# Patient Record
Sex: Female | Born: 1940 | Race: White | Hispanic: No | Marital: Married | Smoking: Former smoker
Health system: Southern US, Community
[De-identification: ages and names within clinical notes are randomized; demographics above are authoritative.]

## PROBLEM LIST (undated history)

## (undated) DIAGNOSIS — I499 Cardiac arrhythmia, unspecified: Secondary | ICD-10-CM

## (undated) DIAGNOSIS — IMO0001 Reserved for inherently not codable concepts without codable children: Secondary | ICD-10-CM

## (undated) DIAGNOSIS — J439 Emphysema, unspecified: Secondary | ICD-10-CM

## (undated) DIAGNOSIS — E559 Vitamin D deficiency, unspecified: Secondary | ICD-10-CM

## (undated) DIAGNOSIS — Z5189 Encounter for other specified aftercare: Secondary | ICD-10-CM

## (undated) DIAGNOSIS — B3781 Candidal esophagitis: Secondary | ICD-10-CM

## (undated) DIAGNOSIS — N319 Neuromuscular dysfunction of bladder, unspecified: Secondary | ICD-10-CM

## (undated) DIAGNOSIS — J189 Pneumonia, unspecified organism: Secondary | ICD-10-CM

## (undated) DIAGNOSIS — M75120 Complete rotator cuff tear or rupture of unspecified shoulder, not specified as traumatic: Secondary | ICD-10-CM

## (undated) DIAGNOSIS — J449 Chronic obstructive pulmonary disease, unspecified: Secondary | ICD-10-CM

## (undated) DIAGNOSIS — K222 Esophageal obstruction: Secondary | ICD-10-CM

## (undated) DIAGNOSIS — K219 Gastro-esophageal reflux disease without esophagitis: Secondary | ICD-10-CM

## (undated) DIAGNOSIS — C801 Malignant (primary) neoplasm, unspecified: Secondary | ICD-10-CM

## (undated) DIAGNOSIS — Z86010 Personal history of colonic polyps: Secondary | ICD-10-CM

## (undated) DIAGNOSIS — I10 Essential (primary) hypertension: Secondary | ICD-10-CM

## (undated) DIAGNOSIS — H269 Unspecified cataract: Secondary | ICD-10-CM

## (undated) DIAGNOSIS — D518 Other vitamin B12 deficiency anemias: Secondary | ICD-10-CM

## (undated) DIAGNOSIS — S42209A Unspecified fracture of upper end of unspecified humerus, initial encounter for closed fracture: Secondary | ICD-10-CM

## (undated) DIAGNOSIS — E1165 Type 2 diabetes mellitus with hyperglycemia: Secondary | ICD-10-CM

## (undated) DIAGNOSIS — E785 Hyperlipidemia, unspecified: Secondary | ICD-10-CM

## (undated) HISTORY — DX: Unspecified fracture of upper end of unspecified humerus, initial encounter for closed fracture: S42.209A

## (undated) HISTORY — DX: Reserved for inherently not codable concepts without codable children: IMO0001

## (undated) HISTORY — PX: COLON SURGERY: SHX602

## (undated) HISTORY — DX: Type 2 diabetes mellitus with hyperglycemia: E11.65

## (undated) HISTORY — PX: CATARACT EXTRACTION, BILATERAL: SHX1313

## (undated) HISTORY — DX: Gastro-esophageal reflux disease without esophagitis: K21.9

## (undated) HISTORY — DX: Hyperlipidemia, unspecified: E78.5

## (undated) HISTORY — DX: Neuromuscular dysfunction of bladder, unspecified: N31.9

## (undated) HISTORY — DX: Chronic obstructive pulmonary disease, unspecified: J44.9

## (undated) HISTORY — DX: Emphysema, unspecified: J43.9

## (undated) HISTORY — PX: DILATION AND CURETTAGE OF UTERUS: SHX78

## (undated) HISTORY — DX: Essential (primary) hypertension: I10

## (undated) HISTORY — DX: Complete rotator cuff tear or rupture of unspecified shoulder, not specified as traumatic: M75.120

## (undated) HISTORY — DX: Vitamin D deficiency, unspecified: E55.9

## (undated) HISTORY — DX: Pneumonia, unspecified organism: J18.9

## (undated) HISTORY — PX: TONSILLECTOMY AND ADENOIDECTOMY: SUR1326

## (undated) HISTORY — DX: Candidal esophagitis: B37.81

## (undated) HISTORY — DX: Encounter for other specified aftercare: Z51.89

## (undated) HISTORY — DX: Other vitamin B12 deficiency anemias: D51.8

## (undated) HISTORY — DX: Unspecified cataract: H26.9

## (undated) HISTORY — DX: Personal history of colonic polyps: Z86.010

---

## 2008-08-03 HISTORY — PX: EYE SURGERY: SHX253

## 2009-08-03 HISTORY — PX: ORIF SHOULDER FRACTURE: SHX5035

## 2010-04-25 ENCOUNTER — Encounter
Admission: RE | Admit: 2010-04-25 | Discharge: 2010-04-25 | Payer: Self-pay | Source: Home / Self Care | Admitting: Family Medicine

## 2010-07-15 ENCOUNTER — Inpatient Hospital Stay (HOSPITAL_COMMUNITY)
Admission: EM | Admit: 2010-07-15 | Discharge: 2010-07-18 | Payer: Self-pay | Source: Home / Self Care | Attending: Orthopedic Surgery | Admitting: Orthopedic Surgery

## 2010-08-22 NOTE — Discharge Summary (Signed)
Amanda, [name]                ACCOUNT NO.:  [id number]  MEDICAL RECORD NO.:  [id number]          PATIENT TYPE:  INP  LOCATION:  5035                         FACILITY:  [facility]  PHYSICIAN:  Harvie Junior, M.D.   DATE OF BIRTH:  July 29, 1941  DATE OF ADMISSION:  07/15/2010 DATE OF DISCHARGE:  07/18/2010                              DISCHARGE SUMMARY   ADMITTING DIAGNOSES: 1. Four-part right proximal humerus fracture with dislocation of right     proximal humerus. 2. History of chronic obstructive pulmonary disease. 3. History of asthma. 4. History of diet-controlled diabetes mellitus. 5. Radial nerve palsy, right upper extremity status post right     shoulder injury 6. Acute blood loss anemia.  DISCHARGE DIAGNOSES: 1. Four-part right proximal humerus fracture with dislocation of right     proximal humerus. 2. History of chronic obstructive pulmonary disease. 3. History of asthma. 4. History of diet-controlled diabetes mellitus. 5. Type 2 diabetes. 6. Leukocytosis. 7. Radial nerve palsy, right upper extremity status post right     shoulder injury 8. Acute blood loss anemia.  PROCEDURES IN HOSPITAL: 1. Hemiarthroplasty, right proximal humerus fracture for treatment of     four-part fracture dislocation of the proximal humerus. 2. Biceps tenodesis. 3. Rotator cuff repair, all done by Jodi Geralds, MD, on July 15, 2010.  CONSULTATIONS IN HOSPITAL:  [facility], Lonia Blood, MD, July 16, 2010.  BRIEF HISTORY:  [name] is a pleasant 70 year old female who fell onto her shoulder on the day of admission.  The patient had no other injuries.  She had a significant injury to her right shoulder.  She was transported to [facility] where x-rays of the right shoulder showed a four-part fracture dislocation of her right proximal humerus. She also had a CT scan of the right proximal humerus which showed a comminuted displaced fracture involving  the right humeral head and neck. Majority of the right humeral head was dislocated inferiorly and medially, lodged below the glenoid.  There were multiple greater tuberosity fragments seen and the more distal humerus is proximally displaced.  There was poor characterization of the rotator cuff and the long head of the biceps tendon as well.  She had some weakness of her right hand dorsiflexors i.e. the radial nerve.  Based upon her clinical and radiographic findings, she is felt to be a candidate for a right shoulder hemiarthroplasty and possible rotator cuff repair and biceps tenodesis depending upon the findings at the time of surgery.  She was admitted by Dr. Luiz Blare for this.  PERTINENT LABORATORY STUDIES:  X-rays of the chest on admission showed no acute cardiopulmonary process, obstructive lung disease was noted as before.  X-ray of the right shoulder showed a significantly displaced fracture dislocation of the proximal right humerus as described.  CT scan of the right shoulder showed a comminuted displaced fracture involving the right humeral head and neck.  The majority of the right humeral head was dislocated inferiorly and medially and lodged below the glenoid.  multiple greater tuberosity fragments were noted and the more distal humerus is proximally displaced.  It was associated with soft tissue disruption with a moderate-to-large lipohemarthrosis with poor characterization of the rotator cuff and long head of the biceps tendon. EKG on admission showed normal sinus rhythm with no significant changes since last tracing.  Hemoglobin on admission was 12.3, hematocrit 36.9, WBC 16.3.  On postop day #1, hemoglobin was 8.6 with a hematocrit of 26.5.  Postop day #2, hemoglobin was 8.6, hematocrit 26.5.  Platelet counts were within normal range.  Indices were grossly within normal limits preoperatively.  Protime on admission was 12.4 seconds and INR of 0.90.  The patient's BMET was  within normal range other than elevated glucose of 263, followed again and on admission was noted to be 271.  On postop day #1, the BMET was within normal range other than elevated glucose of 151.  On July 18, 2010, BMET was normal.  Her EGFR was greater than 60 through the hospitalization.  Her hemoglobin A1c was 6.6.  Lipid profile showed total cholesterol of 120, triglycerides of 67, HDL 46, LDL 61.  Urinalysis on admission showed hyaline casts with greater than 1000 of glucose.  CBGs followed during the hospitalization ranged from 150 up to 195.  HOSPITAL COURSE:  The patient underwent right shoulder surgery as well as described in Dr. Luiz Blare' operative note.  Preoperatively, she did have weakness over right wrist dorsiflexors, felt to be radial nerve stretch.  She initially had this postoperative as well.  She underwent surgery as well described in Dr. Luiz Blare' operative note.  On the day of admission, she was given preoperative IV antibiotics and postop Ancef 1 g IV q.8 h.  Sling was ordered and IV fluids were instituted along with PCA morphine for pain control.  The patient has had a medical consult by Dr. Sharon Seller from the [facility] to manage her COPD, hyperglycemia and glucosuria.  His help was greatly appreciated and followed the patient during the hospitalization.  On postop day #1, the patient had moderate right shoulder pain.  She was taking fluids.  Foley was placed at the time of surgery in place.  Her temperature was 100.1 and then found to be 99.9.  Her vital signs were stable.  Her hemoglobin was 8.6. She was started on oral iron 325 mg b.i.d.  OT saw her for pendulum exercises.  Her Foley catheter was discontinued.  She did have good ability to fire her deltoid but she had radial nerve weakness in the right upper extremity.  On postoperative day #2, the patient was without complaints.  She was taking fluids and voiding without difficulty.  She was out of bed  to the bathroom with assistance.  She had a low grade fever of 99.  Vital signs were stable.  She was with tachycardic at 114. Her hemoglobin was 8.6.  Her O2 sats were 90% on room air.  Her BMET was normal.  Her right shoulder dressing was clean and dry.  She was placed in a wrist splint for her radial nerve palsy.  Her IV was discontinued. She was discharged home in improved condition.  She will be on a diabetic diet, was given Rx Percocet 5 mg p.r.n. for pain.  She will need a home health physical therapy and OT and wear a wrist band on the right upper extremity.  She will follow up with Dr. Luiz Blare in the office in 2 weeks.  Her plan will be to discharge home and follow up with her personal care physician in approximately 7-10 days.  Her  medications at discharge will include albuterol inhaler 2 puffs q.4 h. p.r.n. and albuterol nebulized 4 times daily as needed. Percocet 5 mg 1-2 q.6 h. p.r.n. pain.  Amaryl as directed per [facility], lisinopril 1 daily as directed and Levaquin as directed. She will follow up with her PCP in 7-10 days.  She will call our office should she have any difficulties.     Marshia Ly, P.A.   ______________________________ Harvie Junior, M.D.    JB/MEDQ  D:  08/13/2010  T:  08/14/2010  Job:  [id number]  Electronically Signed by Marshia Ly P.A. on 08/20/2010 04:50:41 PM Electronically Signed by Jodi Geralds M.D. on 08/21/2010 09:00:01 PM

## 2010-10-13 LAB — GLUCOSE, CAPILLARY
Glucose-Capillary: 115 mg/dL — ABNORMAL HIGH (ref 70–99)
Glucose-Capillary: 138 mg/dL — ABNORMAL HIGH (ref 70–99)
Glucose-Capillary: 165 mg/dL — ABNORMAL HIGH (ref 70–99)
Glucose-Capillary: 195 mg/dL — ABNORMAL HIGH (ref 70–99)
Glucose-Capillary: 89 mg/dL (ref 70–99)

## 2010-10-13 LAB — LIPID PANEL
Cholesterol: 120 mg/dL (ref 0–200)
LDL Cholesterol: 61 mg/dL (ref 0–99)
Total CHOL/HDL Ratio: 2.6 RATIO
Triglycerides: 67 mg/dL (ref <150)

## 2010-10-13 LAB — BASIC METABOLIC PANEL
BUN: 12 mg/dL (ref 6–23)
CO2: 27 mEq/L (ref 19–32)
Calcium: 8.4 mg/dL (ref 8.4–10.5)
Calcium: 8.6 mg/dL (ref 8.4–10.5)
Chloride: 102 mEq/L (ref 96–112)
Creatinine, Ser: 0.95 mg/dL (ref 0.4–1.2)
GFR calc Af Amer: >60 mL/min (ref >60)
GFR calc non Af Amer: 58 mL/min — ABNORMAL LOW (ref >60)
Potassium: 4.4 mEq/L (ref 3.5–5.1)
Sodium: 134 mEq/L — ABNORMAL LOW (ref 135–145)

## 2010-10-13 LAB — CBC
HCT: 26.5 % — ABNORMAL LOW (ref 36.0–46.0)
Hemoglobin: 8.6 g/dL — ABNORMAL LOW (ref 12.0–15.0)
Hemoglobin: 8.6 g/dL — ABNORMAL LOW (ref 12.0–15.0)
MCV: 90.1 fL (ref 78.0–100.0)
Platelets: 232 10*3/uL (ref 150–400)
RBC: 2.94 MIL/uL — ABNORMAL LOW (ref 3.87–5.11)
RDW: 13 % (ref 11.5–15.5)
RDW: 13 % (ref 11.5–15.5)
WBC: 12.6 10*3/uL — ABNORMAL HIGH (ref 4.0–10.5)

## 2010-10-13 LAB — HEMOGLOBIN A1C: Hgb A1c MFr Bld: 6.6 % — ABNORMAL HIGH (ref <5.7)

## 2010-10-14 LAB — CBC
Hemoglobin: 12.3 g/dL (ref 12.0–15.0)
MCH: 29.6 pg (ref 26.0–34.0)
MCHC: 33.3 g/dL (ref 30.0–36.0)
MCV: 88.7 fL (ref 78.0–100.0)
RBC: 4.16 MIL/uL (ref 3.87–5.11)
RDW: 12.7 % (ref 11.5–15.5)

## 2010-10-14 LAB — COMPREHENSIVE METABOLIC PANEL
ALT: 11 U/L (ref 0–35)
Albumin: 3.6 g/dL (ref 3.5–5.2)
CO2: 25 mEq/L (ref 19–32)
Calcium: 8.8 mg/dL (ref 8.4–10.5)
Chloride: 104 mEq/L (ref 96–112)
Creatinine, Ser: 0.87 mg/dL (ref 0.4–1.2)
Glucose, Bld: 263 mg/dL — ABNORMAL HIGH (ref 70–99)
Total Bilirubin: 0.4 mg/dL (ref 0.3–1.2)
Total Protein: 6.5 g/dL (ref 6.0–8.3)

## 2010-10-14 LAB — BASIC METABOLIC PANEL
CO2: 25 mEq/L (ref 19–32)
Calcium: 8.6 mg/dL (ref 8.4–10.5)
Creatinine, Ser: 0.82 mg/dL (ref 0.4–1.2)
GFR calc Af Amer: >60 mL/min (ref >60)
GFR calc non Af Amer: >60 mL/min (ref >60)
Glucose, Bld: 271 mg/dL — ABNORMAL HIGH (ref 70–99)
Sodium: 132 mEq/L — ABNORMAL LOW (ref 135–145)

## 2010-10-14 LAB — DIFFERENTIAL
Basophils Absolute: 0 10*3/uL (ref 0.0–0.1)
Eosinophils Absolute: 0.1 10*3/uL (ref 0.0–0.7)
Eosinophils Relative: 1 % (ref 0–5)
Monocytes Absolute: 0.9 10*3/uL (ref 0.1–1.0)
Neutro Abs: 14.3 10*3/uL — ABNORMAL HIGH (ref 1.7–7.7)

## 2010-10-14 LAB — URINE MICROSCOPIC-ADD ON

## 2010-10-14 LAB — URINALYSIS, ROUTINE W REFLEX MICROSCOPIC
Bilirubin Urine: NEGATIVE
Protein, ur: NEGATIVE mg/dL
Urobilinogen, UA: 0.2 mg/dL (ref 0.0–1.0)

## 2010-10-14 LAB — PROTIME-INR: INR: 0.9 (ref 0.00–1.49)

## 2010-10-14 LAB — GLUCOSE, CAPILLARY: Glucose-Capillary: 197 mg/dL — ABNORMAL HIGH (ref 70–99)

## 2013-11-08 ENCOUNTER — Encounter: Payer: Self-pay | Admitting: Internal Medicine

## 2013-12-19 ENCOUNTER — Other Ambulatory Visit: Payer: Self-pay | Admitting: Family Medicine

## 2013-12-19 ENCOUNTER — Ambulatory Visit
Admission: RE | Admit: 2013-12-19 | Discharge: 2013-12-19 | Disposition: A | Discharge status: Home / Self Care | Payer: Medicare HMO | Source: Ambulatory Visit | Attending: Family Medicine | Admitting: Family Medicine

## 2013-12-19 DIAGNOSIS — J189 Pneumonia, unspecified organism: Secondary | ICD-10-CM

## 2013-12-28 ENCOUNTER — Ambulatory Visit (INDEPENDENT_AMBULATORY_CARE_PROVIDER_SITE_OTHER): Payer: Medicare HMO | Admitting: Pulmonary Disease

## 2013-12-28 ENCOUNTER — Encounter: Payer: Self-pay | Admitting: Internal Medicine

## 2013-12-28 ENCOUNTER — Encounter: Payer: Self-pay | Admitting: Pulmonary Disease

## 2013-12-28 ENCOUNTER — Ambulatory Visit (INDEPENDENT_AMBULATORY_CARE_PROVIDER_SITE_OTHER): Payer: Medicare HMO | Admitting: Internal Medicine

## 2013-12-28 ENCOUNTER — Ambulatory Visit (INDEPENDENT_AMBULATORY_CARE_PROVIDER_SITE_OTHER)
Admission: RE | Admit: 2013-12-28 | Discharge: 2013-12-28 | Disposition: A | Discharge status: Home / Self Care | Payer: Medicare HMO | Source: Ambulatory Visit | Attending: Pulmonary Disease | Admitting: Pulmonary Disease

## 2013-12-28 VITALS — BP 142/76 | HR 95 | Ht 61.0 in | Wt 106.0 lb

## 2013-12-28 VITALS — BP 140/72 | HR 92 | Ht 61.5 in | Wt 105.1 lb

## 2013-12-28 DIAGNOSIS — R1310 Dysphagia, unspecified: Secondary | ICD-10-CM

## 2013-12-28 DIAGNOSIS — R0609 Other forms of dyspnea: Secondary | ICD-10-CM

## 2013-12-28 DIAGNOSIS — R0989 Other specified symptoms and signs involving the circulatory and respiratory systems: Secondary | ICD-10-CM

## 2013-12-28 DIAGNOSIS — J441 Chronic obstructive pulmonary disease with (acute) exacerbation: Secondary | ICD-10-CM

## 2013-12-28 DIAGNOSIS — K222 Esophageal obstruction: Secondary | ICD-10-CM

## 2013-12-28 DIAGNOSIS — R1314 Dysphagia, pharyngoesophageal phase: Secondary | ICD-10-CM

## 2013-12-28 HISTORY — DX: Esophageal obstruction: K22.2

## 2013-12-28 MED ORDER — LEVOFLOXACIN 500 MG PO TABS: 500.0000 mg | ORAL_TABLET | Freq: Every day | ORAL | Status: DC

## 2013-12-28 MED ORDER — OMEPRAZOLE 40 MG PO CPDR
40.0000 mg | DELAYED_RELEASE_CAPSULE | Freq: Every day | ORAL | Status: DC
Start: 2013-12-28 — End: 2014-01-04

## 2013-12-28 MED ORDER — AEROCHAMBER MV MISC: Status: DC

## 2013-12-28 MED ORDER — PREDNISONE 10 MG PO TABS: ORAL_TABLET | ORAL | Status: DC

## 2013-12-28 NOTE — Patient Instructions (Addendum)
You have been scheduled for an endoscopy with propofol. Please follow written instructions given to you at your visit today. If you use inhalers (even only as needed), please bring them with you on the day of your procedure.  We have made you an appointment today with Dr. Simonne Maffucci at 11:45am on our second floor.  .I appreciate the opportunity to care for you.

## 2013-12-28 NOTE — Assessment & Plan Note (Addendum)
She is wheezing and not moving much air on exam today and considering her recent chest x-ray which shows emphysema I think the most likely etiology of her current episode of dyspnea and chest tightness is a COPD exacerbation.  However, she has not responded to therapy with prednisone and antibiotics earlier in the month. This either means that she has not received enough prednisone or that something else is going on. Right now all signs point to COPD.  We do not have pulmonary function tests on record to note the severity of her COPD.  Plan: - Chest x-ray today -EKG today -Long prednisone taper -Levaquin -Start Dulera twice a day with a spacer -If no improvement in 2-3 days or if worsening in the hospital for IV Solu-Medrol and further management -Followup with me in 4 weeks

## 2013-12-28 NOTE — Progress Notes (Signed)
Subjective:    Patient ID: Amanda [name], female    DOB: 01-31-1941, 73 y.o.   MRN: [id number]  HPI  This is a very pleasant 73 year old female who previously smoked one pack of cigarettes daily for approximately 40 years and quit smoking in 2007 who comes to our clinic today for evaluation of shortness of breath. She believes that she has COPD but she has never had lung function testing. She never had a prior diagnosis upper respiratory illness.  She states that ever since the beginning of May she has been having problems with chest congestion and cough. This initially started without shortness of breath and so she was seen by her primary care physician who apparently were prescribed an antibiotic at the beginning of May. Shortness of breath and chest tightness develops not long after this and so she went back to her primary care physician on May 17. A chest x-ray at that time showed emphysema and no evidence of an infiltrate. She was prescribed amoxicillin and prednisone at that point. Since then she says that she has not had benefit and she continues to have dyspnea on exertion. She does not have dyspnea at rest. She has not had chest pain, fevers, or chills. She feels chest congestion and has a cough but is been unable to produce mucus. She has been using albuterol on a regular basis at home that provides a little but of relief. She does not take any inhaled therapies other than this. She has not had leg swelling lately.  She also notes that she has been experiencing dysphagia and apparently is supposed to have an endoscopy with esophageal dilation at some point in the next few weeks.  Past Medical History  Diagnosis Date  . Emphysema lung   . Hyperlipidemia   . GERD (gastroesophageal reflux disease)   . Vitamin D deficiency   . Closed fracture of unspecified part of upper end of humerus   . Complete rupture of rotator cuff   . Type II or unspecified type diabetes mellitus without mention of  complication, uncontrolled   . Other vitamin B12 deficiency anemia   . Neurogenic bladder, NOS   . Essential hypertension, benign   . COPD (chronic obstructive pulmonary disease)      Family History  Problem Relation Age of Onset  . Diabetes Father   . Bone cancer Father     bone marrow  . Emphysema Mother   . Pneumonia Mother      History   Social History  . Marital Status: Divorced    Spouse Name: N/A    Number of Children: 81  . Years of Education: N/A   Occupational History  . retired    Social History Main Topics  . Smoking status: Former Smoker -- 1.00 packs/day for 20 years    Types: Cigarettes    Quit date: 08/03/2005  . Smokeless tobacco: Never Used  . Alcohol Use: No  . Drug Use: No  . Sexual Activity: Not on file   Other Topics Concern  . Not on file   Social History Narrative  . No narrative on file     No Known Allergies   Outpatient Prescriptions Prior to Visit  Medication Sig Dispense Refill  . albuterol (PROAIR HFA) 108 (90 BASE) MCG/ACT inhaler Inhale 2 puffs into the lungs 4 (four) times daily.      Marland Kitchen albuterol (PROVENTIL) (2.5 MG/3ML) 0.083% nebulizer solution Take 2.5 mg by nebulization 4 (four) times daily.      Marland Kitchen  omeprazole (PRILOSEC) 40 MG capsule Take 1 capsule (40 mg total) by mouth daily before breakfast. Place in applesauce or open capsule and sprinkle on applesauce  30 capsule  11   No facility-administered medications prior to visit.      Review of Systems  Constitutional: Negative for fever and unexpected weight change.  HENT: Negative for congestion, dental problem, ear pain, nosebleeds, postnasal drip, rhinorrhea, sinus pressure, sneezing, sore throat and trouble swallowing.   Eyes: Negative for redness and itching.  Respiratory: Positive for shortness of breath and wheezing. Negative for cough and chest tightness.   Cardiovascular: Negative for palpitations and leg swelling.  Gastrointestinal: Negative for nausea and  vomiting.  Genitourinary: Negative for dysuria.  Musculoskeletal: Negative for joint swelling.  Skin: Negative for rash.  Neurological: Negative for headaches.  Hematological: Does not bruise/bleed easily.  Psychiatric/Behavioral: Negative for dysphoric mood. The patient is not nervous/anxious.        Objective:   Physical Exam Filed Vitals:   12/28/13 1138  BP: 142/76  Pulse: 95  Height: 5\' 1"  (1.549 m)  Weight: 106 lb (48.081 kg)  SpO2: 94%   RA  Gen: dyspnic on exertion but comfortable at rest, no acute distress HEENT: NCAT, PERRL, EOMi, OP clear, neck supple without masses PULM: Poor air movement, wheezing bilaterally CV: RRR, no mgr, no JVD AB: BS+, soft, nontender, no hsm Ext: warm, no edema, no clubbing, no cyanosis Derm: no rash or skin breakdown Neuro: A&Ox4, CN II-XII intact, strength 5/5 in all 4 extremities  12/17/2013 CXR imaging reviewed> emphysema, no infiltrate, calcified aortic arch     Assessment & Plan:   COPD exacerbation She is wheezing and not moving much air on exam today and considering her recent chest x-ray which shows emphysema I think the most likely etiology of her current episode of dyspnea and chest tightness is a COPD exacerbation.  However, she has not responded to therapy with prednisone and antibiotics earlier in the month. This either means that she has not received enough prednisone or that something else is going on. Right now all signs point to COPD.  We do not have pulmonary function tests on record to note the severity of her COPD.  Plan: - Chest x-ray today -EKG today -Long prednisone taper -Levaquin -Start Dulera twice a day with a spacer -If no improvement in 2-3 days or if worsening in the hospital for IV Solu-Medrol and further management -Followup with me in 4 weeks  Dysphagia Endoscopy with Dr. Carlean Purl is planned. Once she is over the COPD exacerbation I expect she should be able to tolerate this procedure without  difficulty.     Updated Medication List Outpatient Encounter Prescriptions as of 12/28/2013  Medication Sig  . albuterol (PROAIR HFA) 108 (90 BASE) MCG/ACT inhaler Inhale 2 puffs into the lungs 4 (four) times daily.  Marland Kitchen albuterol (PROVENTIL) (2.5 MG/3ML) 0.083% nebulizer solution Take 2.5 mg by nebulization 4 (four) times daily.  Marland Kitchen omeprazole (PRILOSEC) 40 MG capsule Take 1 capsule (40 mg total) by mouth daily before breakfast. Place in applesauce or open capsule and sprinkle on applesauce  . levofloxacin (LEVAQUIN) 500 MG tablet Take 1 tablet (500 mg total) by mouth daily.  . predniSONE (DELTASONE) 10 MG tablet Take 60mg  daily for 3 days, then take 40mg  daily for three days, then take 20mg  daily for three days, then take 10mg  daily for three days

## 2013-12-28 NOTE — Assessment & Plan Note (Signed)
Endoscopy with Dr. Carlean Purl is planned. Once she is over the COPD exacerbation I expect she should be able to tolerate this procedure without difficulty.

## 2013-12-28 NOTE — Patient Instructions (Signed)
Take the prednisone and levaquin as written Use the Dulera twice a day  Use your albuterol nebulizer regularly three times a day and every 4 hours as needed for shortness of breath If you don't get better in 2-3 days or if you get worse then go to the hospital We will see you back in 4 weeks or sooner if needed

## 2013-12-28 NOTE — Progress Notes (Signed)
Quick Note:  lmtcb X1 ______ 

## 2013-12-28 NOTE — Progress Notes (Signed)
Subjective:    Patient ID: Amanda [name], female    DOB: [birth year], 73 y.o.   MRN: [id number]  HPI The patient is a very nice 73 year old woman here with her daughter, with complaints of a long history, about 15 years, and intermittent solid food dysphagia. She describes a suprasternal and midsternal sticking point she often has to regurgitate food bolus. Hamburgers cornbread and rice are particular problem foods. She said she is to have heartburn problems but does not now. It sounds like she was on a PPI at some point in the past. She has chronic difficulty swallowing pills which seem separate than the food dysphagia. She has not had any progressive lost weight but has had a little weight loss recently she has been diagnosed with bronchitis or pneumonia and she is complaining of a lot of dyspnea on exertion. He would like some help with her breathing. She is on albuterol nebulizer and inhaler recently took a Z-Pak and prednisone. There is a cough which is improving. No Known Allergies No outpatient prescriptions prior to visit.   No facility-administered medications prior to visit.   Past Medical History  Diagnosis Date  . Emphysema lung   . Hyperlipidemia   . GERD (gastroesophageal reflux disease)   . Vitamin D deficiency   . Closed fracture of unspecified part of upper end of humerus   . Complete rupture of rotator cuff   . Type II or unspecified type diabetes mellitus without mention of complication, uncontrolled   . Other vitamin B12 deficiency anemia   . Neurogenic bladder, NOS   . Essential hypertension, benign   . COPD (chronic obstructive pulmonary disease)    Past Surgical History  Procedure Laterality Date  . Orif shoulder fracture Right     arthroplasty  . Tonsillectomy and adenoidectomy     History   Social History  . Marital Status: Divorced    Spouse Name: N/A    Number of Children: 73  . Years of Education: N/A   Occupational History  . retired     Social History Main Topics  . Smoking status: Former Smoker -- 1.00 packs/day for 20 years    Types: Cigarettes    Quit date: 08/03/2005  . Smokeless tobacco: Never Used  . Alcohol Use: No  . Drug Use: No  . Sexual Activity: None   Other Topics Concern  . None   Social History Narrative   Divorced and retired Insurance claims handler. 2 sons 2 daughters. 2 caffeinated beverages daily.   Lives in [place].   Family History  Problem Relation Age of Onset  . Diabetes Father   . Bone cancer Father     bone marrow  . Emphysema Mother   . Pneumonia Mother     Review of Systems As per history of present illness. He feels like she is urinating frequently. All other review of systems negative.    Objective:   Physical Exam General:  Well-developed, well-nourished and in no acute distress Eyes:  anicteric. ENT:   Mouth and posterior pharynx free of lesions.  Neck:   supple w/o thyromegaly or mass.  Lungs: Diffusely decreased BS and scattered weezes, poor - fair air mvt, + kyphosis Heart:  Distant S1S2, no rubs, murmurs, gallops. Abdomen:  soft, non-tender, no hepatosplenomegaly, hernia, or mass and BS+.  Lymph:  no cervical or supraclavicular adenopathy. Extremities:   no edema Skin   no rash. Neuro:  A&O x 3.  Psych:  appropriate mood and  Affect.   Data Reviewed: Recent chest x-ray.       Assessment & Plan:   1. Dysphagia, pharyngoesophageal phase   2. Dyspnea on exertion    1. Will schedule upper GI endoscopy for #1. Risks benefits and indications are explained. Esophageal dilation is planned. 2. Start PPI. 3. She was seen by pulmonary today and treatment changes made regarding dyspnea and her COPD exacerbation.. As long as she improves with their expert care would be able to proceed with endoscopy as planned on June 10. Otherwise might need to delay that.   CC: Tamsen Roers, MD and Roselie Awkward, MD

## 2014-01-01 ENCOUNTER — Emergency Department (HOSPITAL_COMMUNITY): Payer: Medicare HMO

## 2014-01-01 ENCOUNTER — Emergency Department (HOSPITAL_COMMUNITY)
Admission: EM | Admit: 2014-01-01 | Discharge: 2014-01-01 | Disposition: A | Discharge status: Home / Self Care | Payer: Medicare HMO | Attending: Emergency Medicine | Admitting: Emergency Medicine

## 2014-01-01 ENCOUNTER — Other Ambulatory Visit: Payer: Self-pay

## 2014-01-01 ENCOUNTER — Encounter (HOSPITAL_COMMUNITY): Payer: Self-pay | Admitting: Emergency Medicine

## 2014-01-01 DIAGNOSIS — IMO0001 Reserved for inherently not codable concepts without codable children: Secondary | ICD-10-CM | POA: Insufficient documentation

## 2014-01-01 DIAGNOSIS — Z79899 Other long term (current) drug therapy: Secondary | ICD-10-CM | POA: Insufficient documentation

## 2014-01-01 DIAGNOSIS — J449 Chronic obstructive pulmonary disease, unspecified: Secondary | ICD-10-CM

## 2014-01-01 DIAGNOSIS — Z8781 Personal history of (healed) traumatic fracture: Secondary | ICD-10-CM | POA: Insufficient documentation

## 2014-01-01 DIAGNOSIS — D649 Anemia, unspecified: Secondary | ICD-10-CM | POA: Insufficient documentation

## 2014-01-01 DIAGNOSIS — I10 Essential (primary) hypertension: Secondary | ICD-10-CM | POA: Insufficient documentation

## 2014-01-01 DIAGNOSIS — Z87891 Personal history of nicotine dependence: Secondary | ICD-10-CM | POA: Insufficient documentation

## 2014-01-01 DIAGNOSIS — Z8739 Personal history of other diseases of the musculoskeletal system and connective tissue: Secondary | ICD-10-CM | POA: Insufficient documentation

## 2014-01-01 DIAGNOSIS — Z8639 Personal history of other endocrine, nutritional and metabolic disease: Secondary | ICD-10-CM | POA: Insufficient documentation

## 2014-01-01 DIAGNOSIS — Z862 Personal history of diseases of the blood and blood-forming organs and certain disorders involving the immune mechanism: Secondary | ICD-10-CM | POA: Insufficient documentation

## 2014-01-01 DIAGNOSIS — J438 Other emphysema: Secondary | ICD-10-CM | POA: Insufficient documentation

## 2014-01-01 DIAGNOSIS — Z792 Long term (current) use of antibiotics: Secondary | ICD-10-CM | POA: Insufficient documentation

## 2014-01-01 DIAGNOSIS — Z87448 Personal history of other diseases of urinary system: Secondary | ICD-10-CM | POA: Insufficient documentation

## 2014-01-01 DIAGNOSIS — IMO0002 Reserved for concepts with insufficient information to code with codable children: Secondary | ICD-10-CM | POA: Insufficient documentation

## 2014-01-01 DIAGNOSIS — E1165 Type 2 diabetes mellitus with hyperglycemia: Secondary | ICD-10-CM

## 2014-01-01 DIAGNOSIS — K219 Gastro-esophageal reflux disease without esophagitis: Secondary | ICD-10-CM | POA: Insufficient documentation

## 2014-01-01 LAB — CBC WITH DIFFERENTIAL/PLATELET
Basophils Absolute: 0 10*3/uL (ref 0.0–0.1)
Basophils Relative: 0 % (ref 0–1)
EOS PCT: 0 % (ref 0–5)
Eosinophils Absolute: 0 10*3/uL (ref 0.0–0.7)
HCT: 24.7 % — ABNORMAL LOW (ref 36.0–46.0)
Hemoglobin: 7.5 g/dL — ABNORMAL LOW (ref 12.0–15.0)
Lymphocytes Relative: 7 % — ABNORMAL LOW (ref 12–46)
Lymphs Abs: 0.6 10*3/uL — ABNORMAL LOW (ref 0.7–4.0)
MCH: 21.9 pg — ABNORMAL LOW (ref 26.0–34.0)
MCHC: 30.4 g/dL (ref 30.0–36.0)
MCV: 72.2 fL — AB (ref 78.0–100.0)
MONOS PCT: 6 % (ref 3–12)
Monocytes Absolute: 0.5 10*3/uL (ref 0.1–1.0)
NEUTROS PCT: 87 % — AB (ref 43–77)
Neutro Abs: 7.7 10*3/uL (ref 1.7–7.7)
PLATELETS: 474 10*3/uL — AB (ref 150–400)
RBC: 3.42 MIL/uL — AB (ref 3.87–5.11)
RDW: 14.8 % (ref 11.5–15.5)
WBC: 8.8 10*3/uL (ref 4.0–10.5)

## 2014-01-01 LAB — I-STAT TROPONIN, ED: Troponin i, poc: 0.01 ng/mL (ref 0.00–0.08)

## 2014-01-01 LAB — COMPREHENSIVE METABOLIC PANEL
ALT: 12 U/L (ref 0–35)
AST: 17 U/L (ref 0–37)
Albumin: 3.6 g/dL (ref 3.5–5.2)
Alkaline Phosphatase: 58 U/L (ref 39–117)
BILIRUBIN TOTAL: 0.2 mg/dL — AB (ref 0.3–1.2)
BUN: 19 mg/dL (ref 6–23)
CHLORIDE: 101 meq/L (ref 96–112)
CO2: 27 meq/L (ref 19–32)
Calcium: 9.5 mg/dL (ref 8.4–10.5)
Creatinine, Ser: 0.92 mg/dL (ref 0.50–1.10)
GFR calc Af Amer: 69 mL/min — ABNORMAL LOW (ref >90)
GFR, EST NON AFRICAN AMERICAN: 59 mL/min — AB (ref >90)
Glucose, Bld: 154 mg/dL — ABNORMAL HIGH (ref 70–99)
POTASSIUM: 3.8 meq/L (ref 3.7–5.3)
SODIUM: 137 meq/L (ref 137–147)
Total Protein: 6.9 g/dL (ref 6.0–8.3)

## 2014-01-01 LAB — PRO B NATRIURETIC PEPTIDE: Pro B Natriuretic peptide (BNP): 1711 pg/mL — ABNORMAL HIGH (ref 0–450)

## 2014-01-01 LAB — POC OCCULT BLOOD, ED: Fecal Occult Bld: NEGATIVE

## 2014-01-01 MED ORDER — ALBUTEROL (5 MG/ML) CONTINUOUS INHALATION SOLN
15.0000 mg/h | INHALATION_SOLUTION | Freq: Once | RESPIRATORY_TRACT | Status: AC
  Administered 2014-01-01: 15 mg/h via RESPIRATORY_TRACT
  Filled 2014-01-01: qty 20

## 2014-01-01 MED ORDER — METHYLPREDNISOLONE SODIUM SUCC 125 MG IJ SOLR
125.0000 mg | Freq: Once | INTRAMUSCULAR | Status: AC
  Administered 2014-01-01: 125 mg via INTRAVENOUS
  Filled 2014-01-01: qty 2

## 2014-01-01 MED ORDER — FERROUS SULFATE 325 (65 FE) MG PO TABS: 325.0000 mg | ORAL_TABLET | Freq: Every day | ORAL | Status: AC

## 2014-01-01 MED ORDER — IOHEXOL 350 MG/ML SOLN
80.0000 mL | Freq: Once | INTRAVENOUS | Status: AC | PRN
  Administered 2014-01-01: 80 mL via INTRAVENOUS

## 2014-01-01 MED ORDER — IPRATROPIUM BROMIDE 0.02 % IN SOLN
0.5000 mg | Freq: Once | RESPIRATORY_TRACT | Status: AC
Start: 2014-01-01 — End: 2014-01-01
  Administered 2014-01-01: 0.5 mg via RESPIRATORY_TRACT
  Filled 2014-01-01: qty 2.5

## 2014-01-01 NOTE — ED Notes (Signed)
Patient with SOB that started back in April.  Patient saw her primary care doctor, and she was given an antibiotic.  Patient did not improve saw Dr. Curt Jews on Thursday and he put her on prednisone and levaquin.  Patient reports no improvement.  Patient is not on 02 at home but does have a history of COPD and emphysema.

## 2014-01-01 NOTE — ED Notes (Signed)
Patient transported to CT 

## 2014-01-01 NOTE — ED Provider Notes (Signed)
CSN: [id number]     Arrival date & time 01/01/14  1005 History   First MD Initiated Contact with Patient 01/01/14 1009     Chief Complaint  Patient presents with  . Shortness of Breath     (Consider location/radiation/quality/duration/timing/severity/associated sxs/prior Treatment) The history is provided by the patient.  Amanda [name] is a 73 y.o. female hx of GERD, HL, COPD here with chest pain and shortness of breath. Short of breath for the last month or so. Went to primary care Dr. several days ago and had a normal chest x-ray but was started on Levaquin and prednisone for COPD exacerbation. Also has been using her nebulizer at home. However still feels short of breath, worse with exertion and laying down. Denies any fevers or chills or previous PEs.    Past Medical History  Diagnosis Date  . Emphysema lung   . Hyperlipidemia   . GERD (gastroesophageal reflux disease)   . Vitamin D deficiency   . Closed fracture of unspecified part of upper end of humerus   . Complete rupture of rotator cuff   . Type II or unspecified type diabetes mellitus without mention of complication, uncontrolled   . Other vitamin B12 deficiency anemia   . Neurogenic bladder, NOS   . Essential hypertension, benign   . COPD (chronic obstructive pulmonary disease)    Past Surgical History  Procedure Laterality Date  . Orif shoulder fracture Right     arthroplasty  . Tonsillectomy and adenoidectomy     Family History  Problem Relation Age of Onset  . Diabetes Father   . Bone cancer Father     bone marrow  . Emphysema Mother   . Pneumonia Mother    History  Substance Use Topics  . Smoking status: Former Smoker -- 1.00 packs/day for 20 years    Types: Cigarettes    Quit date: 08/03/2005  . Smokeless tobacco: Never Used  . Alcohol Use: No   OB History   Grav Para Term Preterm Abortions TAB SAB Ect Mult Living                 Review of Systems  Respiratory: Positive for shortness of breath.    All other systems reviewed and are negative.     Allergies  Review of patient's allergies indicates no known allergies.  Home Medications   Prior to Admission medications   Medication Sig Start Date End Date Taking? Authorizing Provider  albuterol (PROAIR HFA) 108 (90 BASE) MCG/ACT inhaler Inhale 2 puffs into the lungs 4 (four) times daily.   Yes Historical Provider, MD  albuterol (PROVENTIL) (2.5 MG/3ML) 0.083% nebulizer solution Take 2.5 mg by nebulization 4 (four) times daily.   Yes Historical Provider, MD  mometasone-formoterol (DULERA) 100-5 MCG/ACT AERO Inhale 1 puff into the lungs 2 (two) times daily.   Yes Historical Provider, MD  omeprazole (PRILOSEC) 40 MG capsule Take 1 capsule (40 mg total) by mouth daily before breakfast. Place in applesauce or open capsule and sprinkle on applesauce 12/28/13  Yes Gatha Mayer, MD  predniSONE (DELTASONE) 10 MG tablet Take 60mg  daily for 3 days, then take 40mg  daily for three days, then take 20mg  daily for three days, then take 10mg  daily for three days 12/28/13  Yes Juanito Doom, MD  PRESCRIPTION MEDICATION 3 Prednisone Shots given at dr's office   Yes Historical Provider, MD  levofloxacin (LEVAQUIN) 500 MG tablet Take 1 tablet (500 mg total) by mouth daily. 12/28/13 01/07/14  Nathaneil Canary  B McQuaid, MD   BP 159/66  Pulse 103  Temp(Src) 98 F (36.7 C) (Oral)  Resp 19  SpO2 98% Physical Exam  Nursing note and vitals reviewed. Constitutional: She is oriented to person, place, and time.  Tachypneic, talking in full sentences   HENT:  Head: Normocephalic.  Mouth/Throat: Oropharynx is clear and moist.  Eyes: Conjunctivae and EOM are normal. Pupils are equal, round, and reactive to light.  Neck: Normal range of motion. Neck supple.  Cardiovascular: Normal rate, regular rhythm and normal heart sounds.   Pulmonary/Chest:  Slightly tachypneic, diminished breath sounds, minimal wheezing.   Abdominal: Soft. Bowel sounds are normal. She exhibits  no distension. There is no tenderness. There is no rebound and no guarding.  Musculoskeletal: Normal range of motion. She exhibits no edema and no tenderness.  Neurological: She is alert and oriented to person, place, and time. No cranial nerve deficit. Coordination normal.  Skin: Skin is warm and dry.  Psychiatric: She has a normal mood and affect. Her behavior is normal. Judgment and thought content normal.    ED Course  Procedures (including critical care time) Labs Review Labs Reviewed  CBC WITH DIFFERENTIAL - Abnormal; Notable for the following:    RBC 3.42 (*)    Hemoglobin 7.5 (*)    HCT 24.7 (*)    MCV 72.2 (*)    MCH 21.9 (*)    Platelets 474 (*)    Neutrophils Relative % 87 (*)    Lymphocytes Relative 7 (*)    Lymphs Abs 0.6 (*)    All other components within normal limits  COMPREHENSIVE METABOLIC PANEL - Abnormal; Notable for the following:    Glucose, Bld 154 (*)    Total Bilirubin 0.2 (*)    GFR calc non Af Amer 59 (*)    GFR calc Af Amer 69 (*)    All other components within normal limits  PRO B NATRIURETIC PEPTIDE - Abnormal; Notable for the following:    Pro B Natriuretic peptide (BNP) 1711.0 (*)    All other components within normal limits  I-STAT TROPOININ, ED  POC OCCULT BLOOD, ED    Imaging Review Dg Chest 2 View  01/01/2014   CLINICAL DATA:  Shortness of Breath  EXAM: CHEST  2 VIEW  COMPARISON:  Dec 28, 2013  FINDINGS: There is underlying emphysematous change. There is no edema or consolidation. Heart size is normal. Pulmonary vascularity reflects underlying emphysema. No adenopathy. Patient is status post right total shoulder replacement.  IMPRESSION: Underlying emphysematous change.  No edema or consolidation.   Electronically Signed   By: Lowella Grip M.D.   On: 01/01/2014 11:13   Ct Angio Chest Pe W/cm &/or Wo Cm  01/01/2014   CLINICAL DATA:  Shortness of breath.  Question pulmonary embolus.  EXAM: CT ANGIOGRAPHY CHEST WITH CONTRAST  TECHNIQUE:  Multidetector CT imaging of the chest was performed using the standard protocol during bolus administration of intravenous contrast. Multiplanar CT image reconstructions and MIPs were obtained to evaluate the vascular anatomy.  CONTRAST:  23mL OMNIPAQUE IOHEXOL 350 MG/ML SOLN  COMPARISON:  None.  FINDINGS: No pulmonary embolus. No pathologically enlarged mediastinal, hilar or axillary lymph nodes. Atherosclerotic calcification of the arterial vasculature, including three-vessel involvement of the coronary arteries. Heart size normal. No pericardial effusion.  Mild biapical pleural parenchymal scarring. Moderate to severe centrilobular emphysema. Lungs are otherwise clear. No pleural fluid. Airway is unremarkable.  Incidental imaging of the upper abdomen shows the visualized portions of the  liver, adrenal glands, kidneys, spleen, stomach and bowel to be grossly unremarkable. No upper abdominal adenopathy. No worrisome lytic or sclerotic lesions.  Review of the MIP images confirms the above findings.  IMPRESSION: 1. Negative for pulmonary embolus. 2. No findings to explain the patient's shortness of breath, other than moderate to severe emphysema. 3. Three-vessel coronary artery calcification.   Electronically Signed   By: Lorin Picket M.D.   On: 01/01/2014 12:42     EKG Interpretation None      MDM   Final diagnoses:  None   Amanda [name] is a 73 y.o. female here with SOB. Consider COPD exacerbation refractory to steroids. Will give IV steroids, albuterol. Will check labs and repeat xray. Will get CT angio to r/o PE if cxr showed no obvious infiltrate.   2:35 PM CT showed no PE. Hg 7.5, dec from 8.5 several years ago. occ neg, brown stool. Felt better after nebs and IV steroids. Never hypoxic, even with ambulation. Wants to go home. Recommend continue steroids, nebs. Will start on iron for anemia. Recommend outpatient f/u.    Wandra Arthurs, MD 01/01/14 [phone]

## 2014-01-01 NOTE — ED Notes (Signed)
Patient transported to X-ray 

## 2014-01-01 NOTE — Discharge Instructions (Signed)
Take iron daily. Your hemoglobin is 7.5 and needs to be rechecked in a week.   Continue steroids and levaquin as prescribed by your doctor.   Use albuterol every 4 hrs for the next 2 days then as needed.   Follow up with your doctor.   Return to ER if you have blood in stool, passing out, chest pain, worse shortness of breath.

## 2014-01-02 NOTE — Progress Notes (Signed)
Quick Note:  Spoke with pt, she is aware of cxr results. Nothing further needed. ______

## 2014-01-03 ENCOUNTER — Other Ambulatory Visit: Payer: Self-pay

## 2014-01-03 ENCOUNTER — Telehealth: Payer: Self-pay

## 2014-01-03 DIAGNOSIS — R1310 Dysphagia, unspecified: Secondary | ICD-10-CM

## 2014-01-03 NOTE — Telephone Encounter (Signed)
Spoke with patient and told her that per Dr. Carlean Purl we will cancel her EGD/dil procedure upstairs and r/s it at [facility] his hospital week.  She understood.  I will send her out new instructions for the hospital procedure to be done 01/19/14 at 11:15am.  She is to arrive at 9:45AM.  She was instructed to call with any questions.

## 2014-01-04 ENCOUNTER — Encounter (HOSPITAL_COMMUNITY): Payer: Self-pay | Admitting: *Deleted

## 2014-01-04 ENCOUNTER — Encounter (HOSPITAL_COMMUNITY): Payer: Self-pay | Admitting: Pharmacy Technician

## 2014-01-08 ENCOUNTER — Other Ambulatory Visit: Payer: Self-pay

## 2014-01-08 ENCOUNTER — Encounter (HOSPITAL_COMMUNITY): Payer: Self-pay | Admitting: Emergency Medicine

## 2014-01-08 ENCOUNTER — Inpatient Hospital Stay (HOSPITAL_COMMUNITY)
Admission: EM | Admit: 2014-01-08 | Discharge: 2014-01-21 | DRG: 330 | Disposition: A | Discharge status: Home Health | Payer: Medicare HMO | Attending: Internal Medicine | Admitting: Internal Medicine

## 2014-01-08 ENCOUNTER — Emergency Department (HOSPITAL_COMMUNITY): Payer: Medicare HMO

## 2014-01-08 DIAGNOSIS — Y9210 Unspecified residential institution as the place of occurrence of the external cause: Secondary | ICD-10-CM | POA: Diagnosis not present

## 2014-01-08 DIAGNOSIS — E876 Hypokalemia: Secondary | ICD-10-CM | POA: Diagnosis not present

## 2014-01-08 DIAGNOSIS — D72829 Elevated white blood cell count, unspecified: Secondary | ICD-10-CM | POA: Diagnosis present

## 2014-01-08 DIAGNOSIS — K222 Esophageal obstruction: Secondary | ICD-10-CM | POA: Diagnosis present

## 2014-01-08 DIAGNOSIS — I4891 Unspecified atrial fibrillation: Secondary | ICD-10-CM | POA: Diagnosis not present

## 2014-01-08 DIAGNOSIS — J438 Other emphysema: Secondary | ICD-10-CM | POA: Diagnosis present

## 2014-01-08 DIAGNOSIS — Z836 Family history of other diseases of the respiratory system: Secondary | ICD-10-CM

## 2014-01-08 DIAGNOSIS — E559 Vitamin D deficiency, unspecified: Secondary | ICD-10-CM | POA: Diagnosis present

## 2014-01-08 DIAGNOSIS — Y836 Removal of other organ (partial) (total) as the cause of abnormal reaction of the patient, or of later complication, without mention of misadventure at the time of the procedure: Secondary | ICD-10-CM | POA: Diagnosis not present

## 2014-01-08 DIAGNOSIS — Z87891 Personal history of nicotine dependence: Secondary | ICD-10-CM

## 2014-01-08 DIAGNOSIS — E785 Hyperlipidemia, unspecified: Secondary | ICD-10-CM | POA: Diagnosis present

## 2014-01-08 DIAGNOSIS — J449 Chronic obstructive pulmonary disease, unspecified: Secondary | ICD-10-CM | POA: Diagnosis present

## 2014-01-08 DIAGNOSIS — K5730 Diverticulosis of large intestine without perforation or abscess without bleeding: Secondary | ICD-10-CM

## 2014-01-08 DIAGNOSIS — B3781 Candidal esophagitis: Secondary | ICD-10-CM | POA: Diagnosis present

## 2014-01-08 DIAGNOSIS — Z808 Family history of malignant neoplasm of other organs or systems: Secondary | ICD-10-CM

## 2014-01-08 DIAGNOSIS — J439 Emphysema, unspecified: Secondary | ICD-10-CM

## 2014-01-08 DIAGNOSIS — I480 Paroxysmal atrial fibrillation: Secondary | ICD-10-CM

## 2014-01-08 DIAGNOSIS — E1165 Type 2 diabetes mellitus with hyperglycemia: Secondary | ICD-10-CM

## 2014-01-08 DIAGNOSIS — Z01811 Encounter for preprocedural respiratory examination: Secondary | ICD-10-CM

## 2014-01-08 DIAGNOSIS — C180 Malignant neoplasm of cecum: Principal | ICD-10-CM | POA: Diagnosis present

## 2014-01-08 DIAGNOSIS — T380X5A Adverse effect of glucocorticoids and synthetic analogues, initial encounter: Secondary | ICD-10-CM | POA: Diagnosis present

## 2014-01-08 DIAGNOSIS — K621 Rectal polyp: Secondary | ICD-10-CM

## 2014-01-08 DIAGNOSIS — I519 Heart disease, unspecified: Secondary | ICD-10-CM | POA: Diagnosis not present

## 2014-01-08 DIAGNOSIS — Z79899 Other long term (current) drug therapy: Secondary | ICD-10-CM

## 2014-01-08 DIAGNOSIS — E118 Type 2 diabetes mellitus with unspecified complications: Secondary | ICD-10-CM | POA: Diagnosis present

## 2014-01-08 DIAGNOSIS — K922 Gastrointestinal hemorrhage, unspecified: Secondary | ICD-10-CM | POA: Diagnosis present

## 2014-01-08 DIAGNOSIS — R195 Other fecal abnormalities: Secondary | ICD-10-CM

## 2014-01-08 DIAGNOSIS — D62 Acute posthemorrhagic anemia: Secondary | ICD-10-CM | POA: Diagnosis present

## 2014-01-08 DIAGNOSIS — R198 Other specified symptoms and signs involving the digestive system and abdomen: Secondary | ICD-10-CM | POA: Diagnosis present

## 2014-01-08 DIAGNOSIS — K620 Anal polyp: Secondary | ICD-10-CM | POA: Diagnosis present

## 2014-01-08 DIAGNOSIS — J9610 Chronic respiratory failure, unspecified whether with hypoxia or hypercapnia: Secondary | ICD-10-CM | POA: Diagnosis present

## 2014-01-08 DIAGNOSIS — I10 Essential (primary) hypertension: Secondary | ICD-10-CM | POA: Diagnosis present

## 2014-01-08 DIAGNOSIS — Z794 Long term (current) use of insulin: Secondary | ICD-10-CM | POA: Diagnosis present

## 2014-01-08 DIAGNOSIS — D649 Anemia, unspecified: Secondary | ICD-10-CM

## 2014-01-08 DIAGNOSIS — C189 Malignant neoplasm of colon, unspecified: Secondary | ICD-10-CM | POA: Diagnosis present

## 2014-01-08 DIAGNOSIS — N319 Neuromuscular dysfunction of bladder, unspecified: Secondary | ICD-10-CM | POA: Diagnosis present

## 2014-01-08 DIAGNOSIS — IMO0001 Reserved for inherently not codable concepts without codable children: Secondary | ICD-10-CM | POA: Diagnosis present

## 2014-01-08 DIAGNOSIS — D500 Iron deficiency anemia secondary to blood loss (chronic): Secondary | ICD-10-CM | POA: Diagnosis present

## 2014-01-08 DIAGNOSIS — D126 Benign neoplasm of colon, unspecified: Secondary | ICD-10-CM | POA: Diagnosis present

## 2014-01-08 DIAGNOSIS — Z833 Family history of diabetes mellitus: Secondary | ICD-10-CM

## 2014-01-08 DIAGNOSIS — K219 Gastro-esophageal reflux disease without esophagitis: Secondary | ICD-10-CM | POA: Diagnosis present

## 2014-01-08 DIAGNOSIS — R1310 Dysphagia, unspecified: Secondary | ICD-10-CM | POA: Diagnosis present

## 2014-01-08 DIAGNOSIS — J441 Chronic obstructive pulmonary disease with (acute) exacerbation: Secondary | ICD-10-CM

## 2014-01-08 HISTORY — DX: Esophageal obstruction: K22.2

## 2014-01-08 LAB — CBC WITH DIFFERENTIAL/PLATELET
BASOS ABS: 0 10*3/uL (ref 0.0–0.1)
Basophils Relative: 0 % (ref 0–1)
Eosinophils Absolute: 0 10*3/uL (ref 0.0–0.7)
Eosinophils Relative: 0 % (ref 0–5)
HCT: 21.1 % — ABNORMAL LOW (ref 36.0–46.0)
Hemoglobin: 6.5 g/dL — CL (ref 12.0–15.0)
Lymphocytes Relative: 4 % — ABNORMAL LOW (ref 12–46)
Lymphs Abs: 0.6 10*3/uL — ABNORMAL LOW (ref 0.7–4.0)
MCH: 22.3 pg — ABNORMAL LOW (ref 26.0–34.0)
MCHC: 30.8 g/dL (ref 30.0–36.0)
MCV: 72.3 fL — ABNORMAL LOW (ref 78.0–100.0)
MONO ABS: 0.1 10*3/uL (ref 0.1–1.0)
Monocytes Relative: 1 % — ABNORMAL LOW (ref 3–12)
Neutro Abs: 13.7 10*3/uL — ABNORMAL HIGH (ref 1.7–7.7)
Neutrophils Relative %: 95 % — ABNORMAL HIGH (ref 43–77)
PLATELETS: 434 10*3/uL — AB (ref 150–400)
RBC: 2.92 MIL/uL — ABNORMAL LOW (ref 3.87–5.11)
RDW: 16.9 % — ABNORMAL HIGH (ref 11.5–15.5)
WBC: 14.4 10*3/uL — AB (ref 4.0–10.5)

## 2014-01-08 LAB — PREPARE RBC (CROSSMATCH)

## 2014-01-08 LAB — BASIC METABOLIC PANEL
BUN: 12 mg/dL (ref 6–23)
CALCIUM: 8.8 mg/dL (ref 8.4–10.5)
CO2: 26 mEq/L (ref 19–32)
CREATININE: 0.9 mg/dL (ref 0.50–1.10)
Chloride: 97 mEq/L (ref 96–112)
GFR calc non Af Amer: 61 mL/min — ABNORMAL LOW (ref >90)
GFR, EST AFRICAN AMERICAN: 71 mL/min — AB (ref >90)
Glucose, Bld: 244 mg/dL — ABNORMAL HIGH (ref 70–99)
Potassium: 3.9 mEq/L (ref 3.7–5.3)
Sodium: 136 mEq/L — ABNORMAL LOW (ref 137–147)

## 2014-01-08 LAB — GLUCOSE, CAPILLARY
Glucose-Capillary: 218 mg/dL — ABNORMAL HIGH (ref 70–99)
Glucose-Capillary: 83 mg/dL (ref 70–99)

## 2014-01-08 LAB — IRON AND TIBC
Iron: 57 ug/dL (ref 42–135)
Saturation Ratios: 15 % — ABNORMAL LOW (ref 20–55)
TIBC: 370 ug/dL (ref 250–470)
UIBC: 313 ug/dL (ref 125–400)

## 2014-01-08 LAB — APTT: aPTT: 24 seconds (ref 24–37)

## 2014-01-08 LAB — SAMPLE TO BLOOD BANK

## 2014-01-08 LAB — PROTIME-INR
INR: 0.89 (ref 0.00–1.49)
PROTHROMBIN TIME: 11.9 s (ref 11.6–15.2)

## 2014-01-08 LAB — POC OCCULT BLOOD, ED: Fecal Occult Bld: POSITIVE — AB

## 2014-01-08 LAB — HEPATIC FUNCTION PANEL
ALK PHOS: 55 U/L (ref 39–117)
ALT: 12 U/L (ref 0–35)
AST: 15 U/L (ref 0–37)
Albumin: 3.4 g/dL — ABNORMAL LOW (ref 3.5–5.2)
BILIRUBIN TOTAL: 0.3 mg/dL (ref 0.3–1.2)
Total Protein: 6.3 g/dL (ref 6.0–8.3)

## 2014-01-08 LAB — ABO/RH: ABO/RH(D): B POS

## 2014-01-08 LAB — FERRITIN: FERRITIN: 7 ng/mL — AB (ref 10–291)

## 2014-01-08 LAB — RETICULOCYTES
RBC.: 2.92 MIL/uL — AB (ref 3.87–5.11)
RETIC COUNT ABSOLUTE: 99.3 10*3/uL (ref 19.0–186.0)
RETIC CT PCT: 3.4 % — AB (ref 0.4–3.1)

## 2014-01-08 LAB — MRSA PCR SCREENING: MRSA BY PCR: NEGATIVE

## 2014-01-08 LAB — VITAMIN B12: VITAMIN B 12: 669 pg/mL (ref 211–911)

## 2014-01-08 LAB — FOLATE: Folate: >20 ng/mL

## 2014-01-08 MED ORDER — GUAIFENESIN-DM 100-10 MG/5ML PO SYRP: 5.0000 mL | ORAL_SOLUTION | ORAL | Status: DC | PRN

## 2014-01-08 MED ORDER — HYDROCODONE-ACETAMINOPHEN 5-325 MG PO TABS: 1.0000 | ORAL_TABLET | ORAL | Status: DC | PRN

## 2014-01-08 MED ORDER — LEVALBUTEROL HCL 0.63 MG/3ML IN NEBU
0.6300 mg | INHALATION_SOLUTION | Freq: Four times a day (QID) | RESPIRATORY_TRACT | Status: DC
  Administered 2014-01-08 – 2014-01-12 (×12): 0.63 mg via RESPIRATORY_TRACT
  Filled 2014-01-08 (×21): qty 3

## 2014-01-08 MED ORDER — SODIUM CHLORIDE 0.9 % IJ SOLN
3.0000 mL | Freq: Two times a day (BID) | INTRAMUSCULAR | Status: DC
  Administered 2014-01-08 – 2014-01-11 (×3): 3 mL via INTRAVENOUS

## 2014-01-08 MED ORDER — HYDRALAZINE HCL 20 MG/ML IJ SOLN
10.0000 mg | Freq: Four times a day (QID) | INTRAMUSCULAR | Status: DC | PRN
  Filled 2014-01-08: qty 0.5

## 2014-01-08 MED ORDER — INSULIN ASPART 100 UNIT/ML ~~LOC~~ SOLN
0.0000 [IU] | Freq: Three times a day (TID) | SUBCUTANEOUS | Status: DC
  Administered 2014-01-08: 3 [IU] via SUBCUTANEOUS
  Administered 2014-01-09: 2 [IU] via SUBCUTANEOUS
  Administered 2014-01-10: 1 [IU] via SUBCUTANEOUS
  Administered 2014-01-10: 2 [IU] via SUBCUTANEOUS
  Administered 2014-01-11: 1 [IU] via SUBCUTANEOUS
  Administered 2014-01-11: 5 [IU] via SUBCUTANEOUS
  Administered 2014-01-11: 2 [IU] via SUBCUTANEOUS
  Administered 2014-01-12: 3 [IU] via SUBCUTANEOUS
  Administered 2014-01-13: 5 [IU] via SUBCUTANEOUS
  Administered 2014-01-13 – 2014-01-14 (×3): 2 [IU] via SUBCUTANEOUS
  Administered 2014-01-15: 7 [IU] via SUBCUTANEOUS
  Administered 2014-01-16 – 2014-01-17 (×2): 1 [IU] via SUBCUTANEOUS
  Administered 2014-01-17 – 2014-01-18 (×4): 2 [IU] via SUBCUTANEOUS
  Administered 2014-01-18: 5 [IU] via SUBCUTANEOUS
  Administered 2014-01-19: 1 [IU] via SUBCUTANEOUS
  Administered 2014-01-19: 2 [IU] via SUBCUTANEOUS
  Administered 2014-01-20: 1 [IU] via SUBCUTANEOUS
  Administered 2014-01-20 (×2): 3 [IU] via SUBCUTANEOUS
  Administered 2014-01-21: 9 [IU] via SUBCUTANEOUS

## 2014-01-08 MED ORDER — SODIUM CHLORIDE 0.9 % IV SOLN
80.0000 mg | Freq: Once | INTRAVENOUS | Status: AC
  Administered 2014-01-08: 80 mg via INTRAVENOUS
  Filled 2014-01-08: qty 80

## 2014-01-08 MED ORDER — ONDANSETRON HCL 4 MG PO TABS: 4.0000 mg | ORAL_TABLET | Freq: Four times a day (QID) | ORAL | Status: DC | PRN

## 2014-01-08 MED ORDER — ACETAMINOPHEN 650 MG RE SUPP: 650.0000 mg | Freq: Four times a day (QID) | RECTAL | Status: DC | PRN

## 2014-01-08 MED ORDER — MOMETASONE FURO-FORMOTEROL FUM 100-5 MCG/ACT IN AERO
1.0000 | INHALATION_SPRAY | Freq: Two times a day (BID) | RESPIRATORY_TRACT | Status: DC
  Administered 2014-01-08 – 2014-01-12 (×8): 1 via RESPIRATORY_TRACT
  Filled 2014-01-08: qty 8.8

## 2014-01-08 MED ORDER — ACETAMINOPHEN 325 MG PO TABS: 650.0000 mg | ORAL_TABLET | Freq: Four times a day (QID) | ORAL | Status: DC | PRN

## 2014-01-08 MED ORDER — LEVALBUTEROL HCL 0.63 MG/3ML IN NEBU: 0.6300 mg | INHALATION_SOLUTION | Freq: Four times a day (QID) | RESPIRATORY_TRACT | Status: DC

## 2014-01-08 MED ORDER — ONDANSETRON HCL 4 MG/2ML IJ SOLN: 4.0000 mg | Freq: Four times a day (QID) | INTRAMUSCULAR | Status: DC | PRN

## 2014-01-08 MED ORDER — PANTOPRAZOLE SODIUM 40 MG IV SOLR
8.0000 mg/h | INTRAVENOUS | Status: DC
  Administered 2014-01-08 – 2014-01-12 (×8): 8 mg/h via INTRAVENOUS
  Filled 2014-01-08 (×19): qty 80

## 2014-01-08 MED ORDER — INSULIN ASPART 100 UNIT/ML ~~LOC~~ SOLN
0.0000 [IU] | Freq: Every day | SUBCUTANEOUS | Status: DC
  Administered 2014-01-20: 2 [IU] via SUBCUTANEOUS

## 2014-01-08 MED ORDER — HYDROMORPHONE HCL PF 1 MG/ML IJ SOLN: 1.0000 mg | INTRAMUSCULAR | Status: DC | PRN

## 2014-01-08 NOTE — Consult Note (Signed)
GI Consultation  Referring Provider: No ref. provider found Primary Care Physician:  Tamsen Roers, Amanda [name] Primary Gastroenterologist:  Dr. Carlean Purl   Reason for Consultation:  *Anemia, dysphagia**  HPI: Amanda [name] is a 73 y.o. female **with multiple medical problems including COPD, diabetes, hypertension admitted with dyspnea on exertion and a severe anemia.  She was seen in the ED one week ago for worsening dyspnea.  CT angiogram was negative for PE.  Hemoglobin was 7.5 and she tested Hemoccult negative.  She was seen by GI approximately 10 days ago for complaints of dysphagia.  She has dysphagia to solids.  Endoscopy was planned for January 10, 2014. She was admitted for worsening dyspnea and was found to have a drop in hemoglobin to 6.5 with microcytic indices and Hemoccult-positive stool.  Patient denies rectal bleeding, change in bowel habits or abdominal pain.  Stools have darkened since starting iron.  She gets choked up when she eats because of dysphagia to solids.  She really has pyrosis.  She's lost about 10 pounds over the past month.*   Past Medical History  Diagnosis Date  . Emphysema lung   . Hyperlipidemia   . GERD (gastroesophageal reflux disease)   . Vitamin D deficiency   . Closed fracture of unspecified part of upper end of humerus   . Complete rupture of rotator cuff   . Other vitamin B12 deficiency anemia   . Neurogenic bladder, NOS   . COPD (chronic obstructive pulmonary disease)   . Type II or unspecified type diabetes mellitus without mention of complication, uncontrolled     diet controlled  . Essential hypertension, benign     off lisinporil for last 2 months    Past Surgical History  Procedure Laterality Date  . Orif shoulder fracture Right 2011    arthroplasty  . Tonsillectomy and adenoidectomy  age 22 or 43  . Eye surgery Bilateral 2010    both eyes lens replacments    Prior to Admission medications   Medication Sig Start Date End Date Taking?  Authorizing Provider  albuterol (PROAIR HFA) 108 (90 BASE) MCG/ACT inhaler Inhale 2 puffs into the lungs 4 (four) times daily.   Yes Historical Provider, Amanda [name]  albuterol (PROVENTIL) (2.5 MG/3ML) 0.083% nebulizer solution Take 2.5 mg by nebulization 4 (four) times daily.   Yes Historical Provider, Amanda [name]  ferrous sulfate 325 (65 FE) MG tablet Take 1 tablet (325 mg total) by mouth daily. 01/01/14  Yes Wandra Arthurs, Amanda [name]  mometasone-formoterol (DULERA) 100-5 MCG/ACT AERO Inhale 1 puff into the lungs 2 (two) times daily.   Yes Historical Provider, Amanda [name]  omeprazole (PRILOSEC) 40 MG capsule Take 40 mg by mouth every morning.    Yes Historical Provider, Amanda [name]  predniSONE (DELTASONE) 10 MG tablet Take 60mg  daily for 3 days, then take 40mg  daily for three days, then take 20mg  daily for three days, then take 10mg  daily for three days 12/28/13   Juanito Doom, Amanda [name]    Current Facility-Administered Medications  Medication Dose Route Frequency Provider Last Rate Last Dose  . acetaminophen (TYLENOL) tablet 650 mg  650 mg Oral Q6H PRN Ripudeep Krystal Eaton, Amanda [name]       Or  . acetaminophen (TYLENOL) suppository 650 mg  650 mg Rectal Q6H PRN Ripudeep K Rai, Amanda [name]      . guaiFENesin-dextromethorphan (ROBITUSSIN DM) 100-10 MG/5ML syrup 5 mL  5 mL Oral Q4H PRN Ripudeep Krystal Eaton, Amanda [name]      .  hydrALAZINE (APRESOLINE) injection 10 mg  10 mg Intravenous Q6H PRN Ripudeep K Rai, Amanda [name]      . HYDROcodone-acetaminophen (NORCO/VICODIN) 5-325 MG per tablet 1 tablet  1 tablet Oral Q4H PRN Ripudeep K Rai, Amanda [name]      . HYDROmorphone (DILAUDID) injection 1 mg  1 mg Intravenous Q4H PRN Ripudeep K Rai, Amanda [name]      . insulin aspart (novoLOG) injection 0-5 Units  0-5 Units Subcutaneous QHS Ripudeep K Rai, Amanda [name]      . insulin aspart (novoLOG) injection 0-9 Units  0-9 Units Subcutaneous TID WC Ripudeep K Rai, Amanda [name]      . levalbuterol (XOPENEX) nebulizer solution 0.63 mg  0.63 mg Nebulization QID Ripudeep K Rai, Amanda [name]      . mometasone-formoterol (DULERA) 100-5 MCG/ACT inhaler 1 puff   1 puff Inhalation BID Ripudeep K Rai, Amanda [name]      . ondansetron (ZOFRAN) tablet 4 mg  4 mg Oral Q6H PRN Ripudeep Krystal Eaton, Amanda [name]       Or  . ondansetron (ZOFRAN) injection 4 mg  4 mg Intravenous Q6H PRN Ripudeep K Rai, Amanda [name]      . pantoprazole (PROTONIX) 80 mg in sodium chloride 0.9 % 100 mL IVPB  80 mg Intravenous Once [employer], PA-C      . pantoprazole (PROTONIX) 80 mg in sodium chloride 0.9 % 250 mL infusion  8 mg/hr Intravenous Continuous Nicole Pisciotta, PA-C      . sodium chloride 0.9 % injection 3 mL  3 mL Intravenous Q12H Ripudeep Krystal Eaton, Amanda [name]        Allergies as of 01/08/2014  . (No Known Allergies)    Family History  Problem Relation Age of Onset  . Diabetes Father   . Bone cancer Father     bone marrow  . Emphysema Mother   . Pneumonia Mother     History   Social History  . Marital Status: Divorced    Spouse Name: N/A    Number of Children: 71  . Years of Education: N/A   Occupational History  . retired    Social History Main Topics  . Smoking status: Former Smoker -- 1.00 packs/day for 20 years    Types: Cigarettes    Quit date: 08/03/2005  . Smokeless tobacco: Never Used  . Alcohol Use: No  . Drug Use: No  . Sexual Activity: No   Other Topics Concern  . 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 2 caffeinated beverages daily.   Lives in [place].    Review of Systems: Pertinent positive and negative review of systems were noted in the above history of present illness section. All other review of systems were otherwise negative   Physical Exam: Vital signs in last 24 hours: Temp:  [98.1 F (36.7 C)-98.3 F (36.8 C)] 98.2 F (36.8 C) (06/08 1550) Pulse Rate:  [76-96] 94 (06/08 1550) Resp:  [14-32] 21 (06/08 1550) BP: (133-171)/(53-92) 136/66 mmHg (06/08 1545) SpO2:  [95 %-98 %] 96 % (06/08 1550)  General:   Alert,  Well-developed, well-nourished, pleasant and cooperative in NAD Head:   Normocephalic and atraumatic. Eyes:  Sclera clear, no icterus.   Conjunctiva pink. Ears:  Normal auditory acuity. Nose:  No deformity, discharge,  or lesions. Mouth:  No deformity or lesions.  Oropharynx pink & moist. Neck:  Supple; no masses or thyromegaly. Lungs:  Clear throughout to auscultation.   No wheezes, crackles, or rhonchi.  No acute distress. Heart:  Regular rate and rhythm; no murmurs, clicks, rubs,  or gallops. Abdomen:  Soft, nontender and nondistended. No masses, hepatosplenomegaly or hernias noted. Normal bowel sounds, without guarding, and without rebound.   Rectal:  Deferred until time of colonoscopy.   Msk:  Symmetrical without gross deformities. Normal posture. Pulses:  Normal pulses noted. Extremities:  Without clubbing or edema. Neurologic:  Alert and  oriented x4;  grossly normal neurologically. Skin:  Intact without significant lesions or rashes. Cervical Nodes:  No significant cervical adenopathy. Psych:  Alert and cooperative. Normal mood and affect.  Intake/Output from previous day:   Intake/Output this shift: Total I/O In: 12.5 [Blood:12.5] Out: -   Lab Results:  Recent Labs  01/08/14 1311  WBC 14.4*  HGB 6.5*  HCT 21.1*  PLT 434*   BMET  Recent Labs  01/08/14 1311  NA 136*  K 3.9  CL 97  CO2 26  GLUCOSE 244*  BUN 12  CREATININE 0.90  CALCIUM 8.8   LFT  Recent Labs  01/08/14 1311  PROT 6.3  ALBUMIN 3.4*  AST 15  ALT 12  ALKPHOS 55  BILITOT 0.3  BILIDIR <0.2  IBILI NOT CALCULATED   PT/INR  Recent Labs  01/08/14 1311  LABPROT 11.9  INR 0.89   Hepatitis Panel No results found for this basename: HEPBSAG, HCVAB, HEPAIGM, HEPBIGM,  in the last 72 hours Additional Labs *  Studies/Results: Dg Chest Portable 1 View  01/08/2014   CLINICAL DATA:  Weakness.  Cough.  EXAM: PORTABLE CHEST - 1 VIEW  COMPARISON:  01/01/2014  FINDINGS: Pulmonary hyperinflation again seen, consistent with COPD. No evidence of pulmonary infiltrate  or edema. No evidence of pleural effusion. Heart size is within normal limits. No mass or lymphadenopathy identified. Right shoulder prosthesis again noted.  IMPRESSION: COPD.  No active disease.   Electronically Signed   By: Earle Gell M.D.   On: 01/08/2014 14:59    Principal Problem:   Anemia Active Problems:   GI bleed   Dysphagia   COPD (chronic obstructive pulmonary disease)   GERD (gastroesophageal reflux disease)   Hyperlipidemia   Type II or unspecified type diabetes mellitus without mention of complication, uncontrolled   Essential hypertension, benign   Recommendations - 1.  transfuse to hemoglobin 8 * 2.  continue PPI therapy 3.  EGD-scheduled for a.m. 4.  colonoscopy if upper endoscopy is not diagnostic for a GI bleeding source**  Assessment - * 1.  microcytic anemia-very likely due to chronic GI blood loss as evidenced by Hemoccult-positive stool.  Occult colonic neoplasm is a consideration although patient does have dysphagia raising the question of a malignant stricture. 2.   dysphagia to solids-likely secondary to stricture, be it malignant or peptic 3.  COPD, diabetes, hypertension-stable1**       Mozell Hardacre D. Deatra Ina, Amanda [name], [facility] [phone]    01/08/2014, 4:45 PM

## 2014-01-08 NOTE — ED Notes (Addendum)
Hemoglobin 6.5 PA Nicole notified.

## 2014-01-08 NOTE — ED Provider Notes (Signed)
CSN: [id number]     Arrival date & time 01/08/14  1247 History   First MD Initiated Contact with Patient 01/08/14 1305     Chief Complaint  Patient presents with  . Abnormal Lab     (Consider location/radiation/quality/duration/timing/severity/associated sxs/prior Treatment) HPI  Amanda [name] is a 73 y.o. female past medical history significant for COPD, hypertension, hyperlipidemia and diet controlled diabetes sent from PCP for anemia with hemoglobin less than 7. Patient has been having increasing shortness of breath, fatigue worsening over the last 4 weeks. Was seen for shortness of breath a week ago, had negative CTA, treated for COPD exacerbation. Patient was also noted to be anemic, guaiac was negative at that time. She was started on iron supplementation. Patient does report that her stool is darker than normal but this was interpreted as being secondary to the iron. She denies abdominal pain, chest pain, palpitations, syncope, excessive NSAID use, excessive drinking, diarrhea. She is on chronic steroids secondary to COPD. Reports increasing bilateral lower extremity edema, worse yesterday.   Past Medical History  Diagnosis Date  . Emphysema lung   . Hyperlipidemia   . GERD (gastroesophageal reflux disease)   . Vitamin D deficiency   . Closed fracture of unspecified part of upper end of humerus   . Complete rupture of rotator cuff   . Other vitamin B12 deficiency anemia   . Neurogenic bladder, NOS   . COPD (chronic obstructive pulmonary disease)   . Type II or unspecified type diabetes mellitus without mention of complication, uncontrolled     diet controlled  . Essential hypertension, benign     off lisinporil for last 2 months   Past Surgical History  Procedure Laterality Date  . Orif shoulder fracture Right 2011    arthroplasty  . Tonsillectomy and adenoidectomy  age 48 or 1  . Eye surgery Bilateral 2010    both eyes lens replacments   Family History  Problem Relation  Age of Onset  . Diabetes Father   . Bone cancer Father     bone marrow  . Emphysema Mother   . Pneumonia Mother    History  Substance Use Topics  . Smoking status: Former Smoker -- 1.00 packs/day for 20 years    Types: Cigarettes    Quit date: 08/03/2005  . Smokeless tobacco: Never Used  . Alcohol Use: No   OB History   Grav Para Term Preterm Abortions TAB SAB Ect Mult Living                 Review of Systems  10 systems reviewed and found to be negative, except as noted in the HPI.   Allergies  Review of patient's allergies indicates no known allergies.  Home Medications   Prior to Admission medications   Medication Sig Start Date End Date Taking? Authorizing Provider  albuterol (PROAIR HFA) 108 (90 BASE) MCG/ACT inhaler Inhale 2 puffs into the lungs 4 (four) times daily.   Yes Historical Provider, MD  albuterol (PROVENTIL) (2.5 MG/3ML) 0.083% nebulizer solution Take 2.5 mg by nebulization 4 (four) times daily.   Yes Historical Provider, MD  ferrous sulfate 325 (65 FE) MG tablet Take 1 tablet (325 mg total) by mouth daily. 01/01/14  Yes Wandra Arthurs, MD  mometasone-formoterol (DULERA) 100-5 MCG/ACT AERO Inhale 1 puff into the lungs 2 (two) times daily.   Yes Historical Provider, MD  omeprazole (PRILOSEC) 40 MG capsule Take 40 mg by mouth every morning.    Yes Historical  Provider, MD  predniSONE (DELTASONE) 10 MG tablet Take 60mg  daily for 3 days, then take 40mg  daily for three days, then take 20mg  daily for three days, then take 10mg  daily for three days 12/28/13   Juanito Doom, MD   BP 165/53  Pulse 93  Temp(Src) 98.1 F (36.7 C) (Oral)  Resp 19  SpO2 97% Physical Exam  Nursing note and vitals reviewed. Constitutional: She is oriented to person, place, and time. She appears well-developed. No distress.   frail  HENT:  Head: Normocephalic and atraumatic.  Mouth/Throat: Oropharynx is clear and moist.  Severe conjunctival pallor  Eyes: Conjunctivae and EOM are  normal. Pupils are equal, round, and reactive to light.  Neck: Normal range of motion. Neck supple.  Cardiovascular: Regular rhythm and intact distal pulses.   Mild tachycardia around 106  Pulmonary/Chest: Effort normal. No stridor. No respiratory distress. She has wheezes. She has no rales. She exhibits no tenderness.  Poor air movement in all [place], trace,scattered expiratory wheezing  Abdominal: Soft. Bowel sounds are normal. She exhibits no distension and no mass. There is no tenderness. There is no rebound and no guarding.  Musculoskeletal: Normal range of motion. She exhibits no edema and no tenderness.  Neurological: She is alert and oriented to person, place, and time.  Psychiatric: She has a normal mood and affect.    ED Course  Procedures (including critical care time) CRITICAL CARE Performed by: Monico Blitz   Total critical care time: 40  Critical care time was exclusive of separately billable procedures and treating other patients.  Critical care was necessary to treat or prevent imminent or life-threatening deterioration.  Critical care was time spent personally by me on the following activities: development of treatment plan with patient and/or surrogate as well as nursing, discussions with consultants, evaluation of patient's response to treatment, examination of patient, obtaining history from patient or surrogate, ordering and performing treatments and interventions, ordering and review of laboratory studies, ordering and review of radiographic studies, pulse oximetry and re-evaluation of patient's condition.   Labs Review Labs Reviewed  CBC WITH DIFFERENTIAL - Abnormal; Notable for the following:    WBC 14.4 (*)    RBC 2.92 (*)    Hemoglobin 6.5 (*)    HCT 21.1 (*)    MCV 72.3 (*)    MCH 22.3 (*)    RDW 16.9 (*)    Platelets 434 (*)    Neutrophils Relative % 95 (*)    Lymphocytes Relative 4 (*)    Monocytes Relative 1 (*)    Neutro Abs 13.7 (*)     Lymphs Abs 0.6 (*)    All other components within normal limits  BASIC METABOLIC PANEL - Abnormal; Notable for the following:    Sodium 136 (*)    Glucose, Bld 244 (*)    GFR calc non Af Amer 61 (*)    GFR calc Af Amer 71 (*)    All other components within normal limits  HEPATIC FUNCTION PANEL - Abnormal; Notable for the following:    Albumin 3.4 (*)    All other components within normal limits  RETICULOCYTES - Abnormal; Notable for the following:    Retic Ct Pct 3.4 (*)    RBC. 2.92 (*)    All other components within normal limits  POC OCCULT BLOOD, ED - Abnormal; Notable for the following:    Fecal Occult Bld POSITIVE (*)    All other components within normal limits  PROTIME-INR  APTT  OCCULT BLOOD X 1 CARD TO LAB, STOOL  VITAMIN B12  FOLATE  IRON AND TIBC  FERRITIN  SAMPLE TO BLOOD BANK  PREPARE RBC (CROSSMATCH)  TYPE AND SCREEN  ABO/RH    Imaging Review Dg Chest Portable 1 View  01/08/2014   CLINICAL DATA:  Weakness.  Cough.  EXAM: PORTABLE CHEST - 1 VIEW  COMPARISON:  01/01/2014  FINDINGS: Pulmonary hyperinflation again seen, consistent with COPD. No evidence of pulmonary infiltrate or edema. No evidence of pleural effusion. Heart size is within normal limits. No mass or lymphadenopathy identified. Right shoulder prosthesis again noted.  IMPRESSION: COPD.  No active disease.   Electronically Signed   By: Earle Gell M.D.   On: 01/08/2014 14:59     EKG Interpretation None      MDM   Final diagnoses:  Symptomatic anemia  Occult blood in stools   Filed Vitals:   01/08/14 1259 01/08/14 1318 01/08/14 1321 01/08/14 1515  BP: 148/73 161/68  165/53  Pulse: 94  93   Temp: 98.3 F (36.8 C)   98.1 F (36.7 C)  TempSrc: Oral     Resp: 18 14 19    SpO2: 96%  97%     Medications  pantoprazole (PROTONIX) 80 mg in sodium chloride 0.9 % 250 mL infusion (not administered)  pantoprazole (PROTONIX) 80 mg in sodium chloride 0.9 % 100 mL IVPB (not administered)    Amanda [name] is a 73 y.o. female presenting with anemia. Hemoglobin found to be 6.5. Vital signs stable, patient mildly tachycardic on my exam. Guaiac is positive today. Patient without abdominal pain, NSAID or alcohol use, or diarrhea. Doubt brisk bleed. She does take chronic steroids for her severe COPD this may be contributing to her occult blood in stool. Patient mildly tachypnea, must be very hard for her to breathe on top of her baseline COPD considering the severity of the anemia. Will hold off on nebulizers as I would not like to create a demand ischemia. Patient is started on Protonix bolus plus drip. Anemia panel is drawn. Transfusion initiated. Pt will be admitted to Dr. Tana Coast to step-down bed.    This is a shared visit with the attending physician who personally evaluated the patient and agrees with the care plan.        Monico Blitz, PA-C 01/08/14 1541

## 2014-01-08 NOTE — H&P (Signed)
History and Physical       Hospital Admission Note Date: 01/08/2014  Patient name: Amanda [name] Medical record number: [id number] Date of birth: [birth year] Age: 73 y.o. Gender: female  PCP: Tamsen Roers, MD  Primary gastroenterologist: Dr. Carlean Purl Primary pulmonologist: Dr. Pennie Banter   Chief Complaint:  Low hemoglobin  HPI: Patient is a 73 year old female with history of COPD emphysema, hypertension, type 2 diabetes mellitus, GERD, hyperlipidemia presented to the ER with anemia. Patient was complaining of increasing fatigue, generalized weakness, increasing shortness of breath in the last 4 weeks. Patient was seen in the ED a week ago for dyspnea and had a negative CT angiogram of the chest for PE. Patient was treated for COPD exacerbation. Patient was found to have Low hemoglobin of 7.5, however the fecal occult test was negative. Patient was started on iron supplementation and was DC'd home. Patient saw her PCP today and was sent to the ER for low hemoglobin, hb 6.5, hematocrit 21.1 in the ED. Patient has noticed black stools, attributed to iron supplementation however a stool occult test is positive in the ED. Patient denies any excessive NSAID use. She has noticed difficulty swallowing in the last 1 month. She has lost about 10 lbs from not eating enough due to dysphagia. Patient was seen by Dr. Carlean Purl on 5/28 and recommended EGD, per patient scheduled on on 01/10/14. Patient also reports that she never had any endoscopy or colonoscopy ever. Patient has finished her prednisone with taper today.   Review of Systems:  Constitutional: Denies fever, chills, diaphoresis, + poor appetite and fatigue.  HEENT: Denies photophobia, eye pain, redness, hearing loss, ear pain, congestion, sore throat, rhinorrhea, sneezing, mouth sores, trouble swallowing, neck pain, neck stiffness and tinnitus.   Respiratory:  patient reports that her shortness of  breath is currently at her baseline  Cardiovascular: Denies chest pain, palpitations and leg swelling.  Gastrointestinal:  please see history of present illness  Genitourinary: Denies dysuria, urgency, frequency, hematuria, flank pain and difficulty urinating.  Musculoskeletal: Denies myalgias, back pain, joint swelling, arthralgias and gait problem.  Skin: Denies pallor, rash and wound.  Neurological: Denies dizziness, seizures, syncope, weakness, light-headedness, numbness and headaches.  Hematological: Denies adenopathy. Easy bruising, personal or family bleeding history  Psychiatric/Behavioral: Denies suicidal ideation, mood changes, confusion, nervousness, sleep disturbance and agitation  Past Medical History: Past Medical History  Diagnosis Date  . Emphysema lung   . Hyperlipidemia   . GERD (gastroesophageal reflux disease)   . Vitamin D deficiency   . Closed fracture of unspecified part of upper end of humerus   . Complete rupture of rotator cuff   . Other vitamin B12 deficiency anemia   . Neurogenic bladder, NOS   . COPD (chronic obstructive pulmonary disease)   . Type II or unspecified type diabetes mellitus without mention of complication, uncontrolled     diet controlled  . Essential hypertension, benign     off lisinporil for last 2 months   Past Surgical History  Procedure Laterality Date  . Orif shoulder fracture Right 2011    arthroplasty  . Tonsillectomy and adenoidectomy  age 28 or 7  . Eye surgery Bilateral 2010    both eyes lens replacments    Medications: Prior to Admission medications   Medication Sig Start Date End Date Taking? Authorizing Provider  albuterol (PROAIR HFA) 108 (90 BASE) MCG/ACT inhaler Inhale 2 puffs into the lungs 4 (four) times daily.   Yes Historical Provider, MD  albuterol (PROVENTIL) (2.5 MG/3ML) 0.083%  nebulizer solution Take 2.5 mg by nebulization 4 (four) times daily.   Yes Historical Provider, MD  ferrous sulfate 325 (65 FE) MG  tablet Take 1 tablet (325 mg total) by mouth daily. 01/01/14  Yes Wandra Arthurs, MD  mometasone-formoterol (DULERA) 100-5 MCG/ACT AERO Inhale 1 puff into the lungs 2 (two) times daily.   Yes Historical Provider, MD  omeprazole (PRILOSEC) 40 MG capsule Take 40 mg by mouth every morning.    Yes Historical Provider, MD  predniSONE (DELTASONE) 10 MG tablet Take 60mg  daily for 3 days, then take 40mg  daily for three days, then take 20mg  daily for three days, then take 10mg  daily for three days 12/28/13   Juanito Doom, MD    Allergies:  No Known Allergies  Social History:  reports that she quit smoking about 8 years ago. Her smoking use included Cigarettes. She has a 20 pack-year smoking history. She has never used smokeless tobacco. She reports that she does not drink alcohol or use illicit drugs.  Family History: Family History  Problem Relation Age of Onset  . Diabetes Father   . Bone cancer Father     bone marrow  . Emphysema Mother   . Pneumonia Mother     Physical Exam: Blood pressure 161/68, pulse 93, temperature 98.3 F (36.8 C), temperature source Oral, resp. rate 19, SpO2 97.00%. General: Alert, awake, oriented x3, in no acute distress. HEENT: normocephalic, atraumatic, anicteric sclera, pink conjunctiva, pupils equal and reactive to light and accomodation, oropharynx clear Neck: supple, no masses or lymphadenopathy, no goiter, no bruits  Heart: Regular rate and rhythm, without murmurs, rubs or gallops. Lungs:  decreased breath sounds throughout but no wheezing  Abdomen: Soft, nontender, nondistended, positive bowel sounds, no masses. Extremities: No clubbing, cyanosis or edema with positive pedal pulses. Neuro: Grossly intact, no focal neurological deficits, strength 5/5 upper and lower extremities bilaterally Psych: alert and oriented x 3, normal mood and affect Skin: no rashes or lesions, warm and dry   LABS on Admission:  Basic Metabolic Panel:  Recent Labs Lab  01/08/14 1311  NA 136*  K 3.9  CL 97  CO2 26  GLUCOSE 244*  BUN 12  CREATININE 0.90  CALCIUM 8.8   Liver Function Tests:  Recent Labs Lab 01/08/14 1311  AST 15  ALT 12  ALKPHOS 55  BILITOT 0.3  PROT 6.3  ALBUMIN 3.4*   No results found for this basename: LIPASE, AMYLASE,  in the last 168 hours No results found for this basename: AMMONIA,  in the last 168 hours CBC:  Recent Labs Lab 01/08/14 1311  WBC 14.4*  NEUTROABS 13.7*  HGB 6.5*  HCT 21.1*  MCV 72.3*  PLT 434*   Cardiac Enzymes: No results found for this basename: CKTOTAL, CKMB, CKMBINDEX, TROPONINI,  in the last 168 hours BNP: No components found with this basename: POCBNP,  CBG: No results found for this basename: GLUCAP,  in the last 168 hours   Radiological Exams on Admission:    Dg Chest Portable 1 View  01/08/2014   CLINICAL DATA:  Weakness.  Cough.  EXAM: PORTABLE CHEST - 1 VIEW  COMPARISON:  01/01/2014  FINDINGS: Pulmonary hyperinflation again seen, consistent with COPD. No evidence of pulmonary infiltrate or edema. No evidence of pleural effusion. Heart size is within normal limits. No mass or lymphadenopathy identified. Right shoulder prosthesis again noted.  IMPRESSION: COPD.  No active disease.   Electronically Signed   By: Sharrie Rothman.D.  On: 01/08/2014 14:59    Assessment/Plan Principal Problem:  Acute on chronic Anemia secondary to GI bleed - Hemoglobin 6.5, admit to step down, will start with 2 units of packed RBC transfusion, recheck H&H after the transfusion, anemia panel, check serial H&H - Place on clear liquid diet, IV PPI, GI has been consulted, will plan endoscopy inpatient  Active Problems:   Dysphagia: Recently in the last 1 month - Place on clear liquid diet, GI has been consulted, EGD hopefully tomorrow     COPD (chronic obstructive pulmonary disease) - Currently stable, patient has finished prednisone today  -  continue nebulizer treatments scheduled with Xopenex,  Dulera - No need of any further steroids, O2 as needed   Leukocytosis: Likely due to prednisone, currently afebrile, chest x-ray done today, showed no pneumonia    GERD (gastroesophageal reflux disease) -Continue PPI      Hyperlipidemia - Continue statin     Type II or unspecified type diabetes mellitus without mention of complication, uncontrolled - Placed on sliding scale insulin    Essential hypertension, benign - Placed on hydralazine as needed   DVT prophylaxis:  SCDS  CODE STATUS: full code    Family Communication: Admission, patients condition and plan of care including tests being ordered have been discussed with the patient and  daughter  who indicates understanding and agree with the plan and Code Status   Further plan will depend as patient's clinical course evolves and further radiologic and laboratory data become available.   Time Spent on Admission:  1 hour   Ripudeep Krystal Eaton M.D. [facility] 01/08/2014, 3:15 PM Pager: [phone]  If 7PM-7AM, please contact night-coverage [email or web address] Password [email or web address]  **Disclaimer: This note was dictated with voice recognition software. Similar sounding words can inadvertently be transcribed and this note may contain transcription errors which may not have been corrected upon publication of note.**

## 2014-01-08 NOTE — ED Notes (Signed)
Pt here sent by PCP for low hemoglobin below 7, pt states she feels tired. Denies pain.

## 2014-01-08 NOTE — ED Provider Notes (Addendum)
  Face-to-face evaluation   History: She follows up with her doctor today who found that she was still anemic, so sent her here. She has been troubled by weakness, and shortness of breath, for several weeks. She was seen in the ED last week, and also seen by pulmonary. Additionally, she was referred to GI and he plans on doing endoscopy. She has not previously been known to have rectal bleeding. She feels tired and shortness of breath is worse with exertion   Physical exam: Alert, elderly, female in mild distress. Lungs clear to auscultation. Heart borderline tachycardia with PACs on heart monitor. Abdomen soft and nontender.  Assessment- Anemia related to GI bleeding. Likely secondary dyspnea.  Medical screening examination/treatment/procedure(s) were conducted as a shared visit with non-physician practitioner(s) and myself.  I personally evaluated the patient during the encounter  Amanda Blade, MD 01/08/14 1552     Date: 01/08/14  Rate: 90   Rhythm: normal sinus rhythm  QRS Axis: normal  PR and QT Intervals: normal  ST/T Wave abnormalities: normal  PR and QRS Conduction Disutrbances:none  Narrative Interpretation: PACs  Old EKG Reviewed: unchanged   Amanda Blade, MD 01/08/14 1627

## 2014-01-09 ENCOUNTER — Encounter (HOSPITAL_COMMUNITY): Payer: Self-pay

## 2014-01-09 ENCOUNTER — Encounter (HOSPITAL_COMMUNITY)
Admission: EM | Disposition: A | Discharge status: Home Health | Payer: Medicare HMO | Source: Home / Self Care | Attending: Internal Medicine

## 2014-01-09 DIAGNOSIS — K922 Gastrointestinal hemorrhage, unspecified: Secondary | ICD-10-CM

## 2014-01-09 DIAGNOSIS — B3781 Candidal esophagitis: Secondary | ICD-10-CM | POA: Diagnosis present

## 2014-01-09 DIAGNOSIS — IMO0001 Reserved for inherently not codable concepts without codable children: Secondary | ICD-10-CM

## 2014-01-09 DIAGNOSIS — K222 Esophageal obstruction: Secondary | ICD-10-CM | POA: Diagnosis present

## 2014-01-09 DIAGNOSIS — E1165 Type 2 diabetes mellitus with hyperglycemia: Secondary | ICD-10-CM

## 2014-01-09 HISTORY — PX: ESOPHAGOGASTRODUODENOSCOPY (EGD) WITH PROPOFOL: SHX5813

## 2014-01-09 HISTORY — DX: Candidal esophagitis: B37.81

## 2014-01-09 LAB — CBC
HCT: 29.5 % — ABNORMAL LOW (ref 36.0–46.0)
Hemoglobin: 9.8 g/dL — ABNORMAL LOW (ref 12.0–15.0)
MCH: 25.8 pg — ABNORMAL LOW (ref 26.0–34.0)
MCHC: 33.2 g/dL (ref 30.0–36.0)
MCV: 77.6 fL — AB (ref 78.0–100.0)
Platelets: 322 10*3/uL (ref 150–400)
RBC: 3.8 MIL/uL — ABNORMAL LOW (ref 3.87–5.11)
RDW: 18.9 % — AB (ref 11.5–15.5)
WBC: 10.8 10*3/uL — ABNORMAL HIGH (ref 4.0–10.5)

## 2014-01-09 LAB — BASIC METABOLIC PANEL
BUN: 12 mg/dL (ref 6–23)
CALCIUM: 8.6 mg/dL (ref 8.4–10.5)
CO2: 25 meq/L (ref 19–32)
CREATININE: 0.94 mg/dL (ref 0.50–1.10)
Chloride: 101 mEq/L (ref 96–112)
GFR calc Af Amer: 67 mL/min — ABNORMAL LOW (ref >90)
GFR calc non Af Amer: 58 mL/min — ABNORMAL LOW (ref >90)
Glucose, Bld: 92 mg/dL (ref 70–99)
Potassium: 3.8 mEq/L (ref 3.7–5.3)
Sodium: 138 mEq/L (ref 137–147)

## 2014-01-09 LAB — TYPE AND SCREEN
ABO/RH(D): B POS
Antibody Screen: NEGATIVE
UNIT DIVISION: 0
Unit division: 0

## 2014-01-09 LAB — GLUCOSE, CAPILLARY
Glucose-Capillary: 100 mg/dL — ABNORMAL HIGH (ref 70–99)
Glucose-Capillary: 156 mg/dL — ABNORMAL HIGH (ref 70–99)

## 2014-01-09 LAB — HEMOGLOBIN A1C
Hgb A1c MFr Bld: 6.8 % — ABNORMAL HIGH (ref <5.7)
Mean Plasma Glucose: 148 mg/dL — ABNORMAL HIGH (ref <117)

## 2014-01-09 LAB — URINE CULTURE
Colony Count: NO GROWTH
Culture: NO GROWTH

## 2014-01-09 LAB — HEMOGLOBIN AND HEMATOCRIT, BLOOD
HCT: 28.6 % — ABNORMAL LOW (ref 36.0–46.0)
HCT: 29.1 % — ABNORMAL LOW (ref 36.0–46.0)
HEMATOCRIT: 32.3 % — AB (ref 36.0–46.0)
Hemoglobin: 10.5 g/dL — ABNORMAL LOW (ref 12.0–15.0)
Hemoglobin: 9.5 g/dL — ABNORMAL LOW (ref 12.0–15.0)
Hemoglobin: 9.5 g/dL — ABNORMAL LOW (ref 12.0–15.0)

## 2014-01-09 SURGERY — ESOPHAGOGASTRODUODENOSCOPY (EGD) WITH PROPOFOL
Anesthesia: Moderate Sedation

## 2014-01-09 MED ORDER — FENTANYL CITRATE 0.05 MG/ML IJ SOLN
INTRAMUSCULAR | Status: AC
  Filled 2014-01-09: qty 2

## 2014-01-09 MED ORDER — DIPHENHYDRAMINE HCL 50 MG/ML IJ SOLN
INTRAMUSCULAR | Status: AC
  Filled 2014-01-09: qty 1

## 2014-01-09 MED ORDER — FLUCONAZOLE 40 MG/ML PO SUSR
100.0000 mg | Freq: Every day | ORAL | Status: DC
  Administered 2014-01-09 – 2014-01-19 (×10): 100 mg via ORAL
  Filled 2014-01-09 (×13): qty 2.5

## 2014-01-09 MED ORDER — PEG-KCL-NACL-NASULF-NA ASC-C 100 G PO SOLR
0.5000 | Freq: Once | ORAL | Status: AC
  Administered 2014-01-10: 100 g via ORAL
  Filled 2014-01-09: qty 1

## 2014-01-09 MED ORDER — FENTANYL CITRATE 0.05 MG/ML IJ SOLN
INTRAMUSCULAR | Status: DC | PRN
  Administered 2014-01-09 (×2): 25 ug via INTRAVENOUS

## 2014-01-09 MED ORDER — MIDAZOLAM HCL 10 MG/2ML IJ SOLN
INTRAMUSCULAR | Status: AC
  Filled 2014-01-09: qty 2

## 2014-01-09 MED ORDER — MIDAZOLAM HCL 10 MG/2ML IJ SOLN
INTRAMUSCULAR | Status: DC | PRN
  Administered 2014-01-09 (×2): 2 mg via INTRAVENOUS

## 2014-01-09 MED ORDER — BUTAMBEN-TETRACAINE-BENZOCAINE 2-2-14 % EX AERO
INHALATION_SPRAY | CUTANEOUS | Status: DC | PRN
  Administered 2014-01-09: 2 via TOPICAL

## 2014-01-09 MED ORDER — PEG-KCL-NACL-NASULF-NA ASC-C 100 G PO SOLR
0.5000 | Freq: Once | ORAL | Status: AC
  Administered 2014-01-09: 100 g via ORAL

## 2014-01-09 MED ORDER — SODIUM CHLORIDE 0.9 % IV SOLN: INTRAVENOUS | Status: DC

## 2014-01-09 MED ORDER — BOOST / RESOURCE BREEZE PO LIQD
1.0000 | Freq: Three times a day (TID) | ORAL | Status: DC
  Administered 2014-01-10 – 2014-01-11 (×3): 1 via ORAL

## 2014-01-09 MED ORDER — SODIUM CHLORIDE 0.9 % IV SOLN
INTRAVENOUS | Status: DC
  Administered 2014-01-09: 19:00:00 via INTRAVENOUS

## 2014-01-09 SURGICAL SUPPLY — 14 items

## 2014-01-09 NOTE — Interval H&P Note (Signed)
History and Physical Interval Note:  01/09/2014 12:11 PM  Amanda [name]  has presented today for surgery, with the diagnosis of esophageal stricture  The various methods of treatment have been discussed with the patient and family. After consideration of risks, benefits and other options for treatment, the patient has consented to  Procedure(s): ESOPHAGOGASTRODUODENOSCOPY (EGD) WITH PROPOFOL (N/A) as a surgical intervention .  The patient's history has been reviewed, patient examined, no change in status, stable for surgery.  I have reviewed the patient's chart and labs.  Questions were answered to the patient's satisfaction.    The recent H&P (dated *01/08/14**) was reviewed, the patient was examined and there is no change in the patients condition since that H&P was completed.   [name]  01/09/2014, 12:11 PM    [name]

## 2014-01-09 NOTE — Progress Notes (Signed)
Progress Note   Amanda [name] KTG:[id number] DOB: 04-07-1941 DOA: 01/08/2014 PCP: Tamsen Roers, MD   Brief Narrative:   Amanda [name] is an 73 y.o. female with a PMH of COPD emphysema, hypertension, type 2 diabetes mellitus, GERD, hyperlipidemia who was admitted 01/08/14 after presenting to the ER with complaints of fatigue, generalized weakness, and a four-week history of increasing shortness of breath. Patient was seen in the ED a week ago for dyspnea and had a negative CT angiogram of the chest for PE. She was treated for COPD exacerbation with steroids. At that time, she was found to have a hemoglobin of 7.5, however the fecal occult test was negative. She was then started on iron supplementation and was DC'd home. She saw her PCP 01/08/14 and was sent to the ER for evaluation of anemia with a hemoglobin of 6.5. Patient has noticed black stools, attributed to iron supplementation however a stool occult test is positive in the ED. Patient denies any excessive NSAID use. She has noticed difficulty swallowing in the last 1 month. She has lost about 10 lbs from not eating enough due to dysphagia. Patient was seen by Dr. Carlean Purl on 12/28/13 and recommended EGD, per patient scheduled on on 01/10/14. Patient also reports that she never had any endoscopy or colonoscopy ever.  Assessment/Plan:   Principal Problem: Acute on chronic anemia secondary to GI bleeding  The patient was admitted to the SDU and given 2 units of PRBCs. Post transfusion hemoglobin was 9.5 mg/dL.  Seen by Dr. Deatra Ina of gastroenterology on 01/08/14 with EGD/possible colonoscopy scheduled for today.  Continue PPI therapy.  Continue serial hemoglobin checks, transfuse as needed to keep hemoglobin greater than 8 mg/dL.  Active Problems: Dysphagia  EGD today.  COPD (chronic obstructive pulmonary disease)  Stable after completing prednisone taper.  Bronchodilator therapy with Xopenex and Dulera ordered.  Supplemental oxygen as  needed.  GERD (gastroesophageal reflux disease)  Continue PPI therapy.  Hyperlipidemia  Continue statin therapy.    Type II or unspecified type diabetes mellitus without mention of complication, uncontrolled  Continue insulin sensitive sliding scale. CBGs 83-210.    Essential hypertension, benign  Continue hydralazine when necessary.  DVT Prophylaxis  Continue SCDs.  Code Status: Full. Family Communication: No family at the bedside. Disposition Plan: Home when stable.   IV Access:    Peripheral IV   Procedures:    None.   Medical Consultants:    Dr. Erskine Emery, Gastroenterology.   Other Consultants:    None.   Anti-Infectives:    None.  Subjective:   Amanda [name] still feels short of breath despite an improvement in her hemoglobin. No stools since admission. No complaints of pain.  Objective:    Filed Vitals:   01/08/14 2230 01/08/14 2300 01/08/14 2333 01/09/14 0400  BP:  159/50 164/61   Pulse: 64 72 65   Temp: 97.5 F (36.4 C)  98.2 F (36.8 C) 97.7 F (36.5 C)  TempSrc: Oral  Oral Oral  Resp: 17 18 20    Height:      Weight:   49.7 kg (109 lb 9.1 oz)   SpO2: 97% 98% 98%     Intake/Output Summary (Last 24 hours) at 01/09/14 0725 Last data filed at 01/08/14 2348  Gross per 24 hour  Intake 1175.83 ml  Output   1500 ml  Net -324.17 ml    Exam: Gen:  NAD Cardiovascular:  RRR, No M/R/G Respiratory:  Lungs CTAB but tachypneic Gastrointestinal:  Abdomen soft,  NT/ND, + BS Extremities:  No C/E/C   Data Reviewed:    Labs: Basic Metabolic Panel:  Recent Labs Lab 01/08/14 1311 01/09/14 0315  NA 136* 138  K 3.9 3.8  CL 97 101  CO2 26 25  GLUCOSE 244* 92  BUN 12 12  CREATININE 0.90 0.94  CALCIUM 8.8 8.6   GFR Estimated Creatinine Clearance: 39 ml/min (by C-G formula based on Cr of 0.94). Liver Function Tests:  Recent Labs Lab 01/08/14 1311  AST 15  ALT 12  ALKPHOS 55  BILITOT 0.3  PROT 6.3  ALBUMIN 3.4*     Coagulation profile  Recent Labs Lab 01/08/14 1311  INR 0.89    CBC:  Recent Labs Lab 01/08/14 1311 01/09/14 0017 01/09/14 0315  WBC 14.4*  --  10.8*  NEUTROABS 13.7*  --   --   HGB 6.5* 9.5* 9.8*  HCT 21.1* 29.1* 29.5*  MCV 72.3*  --  77.6*  PLT 434*  --  322   BNP (last 3 results)  Recent Labs  01/01/14 1100  PROBNP 1711.0*   CBG:  Recent Labs Lab 01/08/14 1623 01/08/14 2200  GLUCAP 218* 83   Anemia work up:  Recent Labs  01/08/14 1311 01/08/14 1408  VITAMINB12  --  669  FOLATE  --  >20.0  FERRITIN  --  7*  TIBC  --  370  IRON  --  57  RETICCTPCT 3.4*  --    Sepsis Labs:  Recent Labs Lab 01/08/14 1311 01/09/14 0315  WBC 14.4* 10.8*   Microbiology Recent Results (from the past 240 hour(s))  MRSA PCR SCREENING     Status: None   Collection Time    01/08/14  4:26 PM      Result Value Ref Range Status   MRSA by PCR NEGATIVE  NEGATIVE Final   Comment:            The GeneXpert MRSA Assay (FDA     approved for NASAL specimens     only), is one component of a     comprehensive MRSA colonization     surveillance program. It is not     intended to diagnose MRSA     infection nor to guide or     monitor treatment for     MRSA infections.     Radiographs/Studies:   Dg Chest Portable 1 View  01/08/2014   CLINICAL DATA:  Weakness.  Cough.  EXAM: PORTABLE CHEST - 1 VIEW  COMPARISON:  01/01/2014  FINDINGS: Pulmonary hyperinflation again seen, consistent with COPD. No evidence of pulmonary infiltrate or edema. No evidence of pleural effusion. Heart size is within normal limits. No mass or lymphadenopathy identified. Right shoulder prosthesis again noted.  IMPRESSION: COPD.  No active disease.   Electronically Signed   By: Earle Gell M.D.   On: 01/08/2014 14:59    Medications:   . insulin aspart  0-5 Units Subcutaneous QHS  . insulin aspart  0-9 Units Subcutaneous TID WC  . levalbuterol  0.63 mg Nebulization QID  . mometasone-formoterol  1  puff Inhalation BID  . sodium chloride  3 mL Intravenous Q12H   Continuous Infusions: . pantoprozole (PROTONIX) infusion 8 mg/hr (01/08/14 1823)    Time spent: 35 minutes with > 50% of time discussing current diagnostic test results, clinical impression and plan of care including teach back.    LOS: 1 day   [place]  [facility] Pager [phone]. If unable to reach me  by pager, please call my cell phone at [phone].  *Please refer to [email or web address], password [email or web address] to get updated schedule on who will round on this patient, as hospitalists switch teams weekly. If 7PM-7AM, please contact night-coverage at [email or web address], password [email or web address] for any overnight needs.  01/09/2014, 7:25 AM    **Disclaimer: This note was dictated with voice recognition software. Similar sounding words can inadvertently be transcribed and this note may contain transcription errors which may not have been corrected upon publication of note.**   Information printed out and reviewed with the patient/family:     In an effort to keep you and your family informed about your hospital stay, I am providing you with this information sheet. If you or your family have any questions, please do not hesitate to have the nursing staff page me to set up a meeting time.  Also note that the hospitalist doctors typically change on Tuesdays or Wednesdays to a different hospitalist doctor.  Kashauna Akhter 01/09/2014 1 (Number of days in the hospital)  Treatment team:  Dr. Jacquelynn Cree, Hospitalist (Internist)  Dr. Erskine Emery, Gastroenterology  Pertinent labs / studies:  Your hemoglobin this morning is 9.8, up from 6.5 after receiving 2 units of blood.  Plan for today: Dr. Deatra Ina plans to do an upper endoscopy and possible colonoscopy to evaluate your stomach and intestines to find the source of bleeding.  We will continue to monitor your blood counts closely. If your hemoglobin falls below 8, we will give you  additional blood.  Anticipated discharge date: 2 days.

## 2014-01-09 NOTE — H&P (View-Only) (Signed)
GI Consultation  Referring Provider: No ref. provider found Primary Care Physician:  Tamsen Roers, MD Primary Gastroenterologist:  Dr. Carlean Purl   Reason for Consultation:  *Anemia, dysphagia**  HPI: Amanda [name] is a 73 y.o. female **with multiple medical problems including COPD, diabetes, hypertension admitted with dyspnea on exertion and a severe anemia.  She was seen in the ED one week ago for worsening dyspnea.  CT angiogram was negative for PE.  Hemoglobin was 7.5 and she tested Hemoccult negative.  She was seen by GI approximately 10 days ago for complaints of dysphagia.  She has dysphagia to solids.  Endoscopy was planned for January 10, 2014. She was admitted for worsening dyspnea and was found to have a drop in hemoglobin to 6.5 with microcytic indices and Hemoccult-positive stool.  Patient denies rectal bleeding, change in bowel habits or abdominal pain.  Stools have darkened since starting iron.  She gets choked up when she eats because of dysphagia to solids.  She really has pyrosis.  She's lost about 10 pounds over the past month.*   Past Medical History  Diagnosis Date  . Emphysema lung   . Hyperlipidemia   . GERD (gastroesophageal reflux disease)   . Vitamin D deficiency   . Closed fracture of unspecified part of upper end of humerus   . Complete rupture of rotator cuff   . Other vitamin B12 deficiency anemia   . Neurogenic bladder, NOS   . COPD (chronic obstructive pulmonary disease)   . Type II or unspecified type diabetes mellitus without mention of complication, uncontrolled     diet controlled  . Essential hypertension, benign     off lisinporil for last 2 months    Past Surgical History  Procedure Laterality Date  . Orif shoulder fracture Right 2011    arthroplasty  . Tonsillectomy and adenoidectomy  age 5 or 17  . Eye surgery Bilateral 2010    both eyes lens replacments    Prior to Admission medications   Medication Sig Start Date End Date Taking?  Authorizing Provider  albuterol (PROAIR HFA) 108 (90 BASE) MCG/ACT inhaler Inhale 2 puffs into the lungs 4 (four) times daily.   Yes Historical Provider, MD  albuterol (PROVENTIL) (2.5 MG/3ML) 0.083% nebulizer solution Take 2.5 mg by nebulization 4 (four) times daily.   Yes Historical Provider, MD  ferrous sulfate 325 (65 FE) MG tablet Take 1 tablet (325 mg total) by mouth daily. 01/01/14  Yes Wandra Arthurs, MD  mometasone-formoterol (DULERA) 100-5 MCG/ACT AERO Inhale 1 puff into the lungs 2 (two) times daily.   Yes Historical Provider, MD  omeprazole (PRILOSEC) 40 MG capsule Take 40 mg by mouth every morning.    Yes Historical Provider, MD  predniSONE (DELTASONE) 10 MG tablet Take 60mg  daily for 3 days, then take 40mg  daily for three days, then take 20mg  daily for three days, then take 10mg  daily for three days 12/28/13   Juanito Doom, MD    Current Facility-Administered Medications  Medication Dose Route Frequency Provider Last Rate Last Dose  . acetaminophen (TYLENOL) tablet 650 mg  650 mg Oral Q6H PRN Ripudeep Krystal Eaton, MD       Or  . acetaminophen (TYLENOL) suppository 650 mg  650 mg Rectal Q6H PRN Ripudeep K Rai, MD      . guaiFENesin-dextromethorphan (ROBITUSSIN DM) 100-10 MG/5ML syrup 5 mL  5 mL Oral Q4H PRN Ripudeep Krystal Eaton, MD      .  hydrALAZINE (APRESOLINE) injection 10 mg  10 mg Intravenous Q6H PRN Ripudeep K Rai, MD      . HYDROcodone-acetaminophen (NORCO/VICODIN) 5-325 MG per tablet 1 tablet  1 tablet Oral Q4H PRN Ripudeep K Rai, MD      . HYDROmorphone (DILAUDID) injection 1 mg  1 mg Intravenous Q4H PRN Ripudeep K Rai, MD      . insulin aspart (novoLOG) injection 0-5 Units  0-5 Units Subcutaneous QHS Ripudeep K Rai, MD      . insulin aspart (novoLOG) injection 0-9 Units  0-9 Units Subcutaneous TID WC Ripudeep K Rai, MD      . levalbuterol (XOPENEX) nebulizer solution 0.63 mg  0.63 mg Nebulization QID Ripudeep K Rai, MD      . mometasone-formoterol (DULERA) 100-5 MCG/ACT inhaler 1 puff   1 puff Inhalation BID Ripudeep K Rai, MD      . ondansetron (ZOFRAN) tablet 4 mg  4 mg Oral Q6H PRN Ripudeep Krystal Eaton, MD       Or  . ondansetron (ZOFRAN) injection 4 mg  4 mg Intravenous Q6H PRN Ripudeep K Rai, MD      . pantoprazole (PROTONIX) 80 mg in sodium chloride 0.9 % 100 mL IVPB  80 mg Intravenous Once [employer], PA-C      . pantoprazole (PROTONIX) 80 mg in sodium chloride 0.9 % 250 mL infusion  8 mg/hr Intravenous Continuous Nicole Pisciotta, PA-C      . sodium chloride 0.9 % injection 3 mL  3 mL Intravenous Q12H Ripudeep Krystal Eaton, MD        Allergies as of 01/08/2014  . (No Known Allergies)    Family History  Problem Relation Age of Onset  . Diabetes Father   . Bone cancer Father     bone marrow  . Emphysema Mother   . Pneumonia Mother     History   Social History  . Marital Status: Divorced    Spouse Name: N/A    Number of Children: 21  . Years of Education: N/A   Occupational History  . retired    Social History Main Topics  . Smoking status: Former Smoker -- 1.00 packs/day for 20 years    Types: Cigarettes    Quit date: 08/03/2005  . Smokeless tobacco: Never Used  . Alcohol Use: No  . Drug Use: No  . Sexual Activity: No   Other Topics Concern  . 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 2 caffeinated beverages daily.   Lives in [place].    Review of Systems: Pertinent positive and negative review of systems were noted in the above history of present illness section. All other review of systems were otherwise negative   Physical Exam: Vital signs in last 24 hours: Temp:  [98.1 F (36.7 C)-98.3 F (36.8 C)] 98.2 F (36.8 C) (06/08 1550) Pulse Rate:  [76-96] 94 (06/08 1550) Resp:  [14-32] 21 (06/08 1550) BP: (133-171)/(53-92) 136/66 mmHg (06/08 1545) SpO2:  [95 %-98 %] 96 % (06/08 1550)  General:   Alert,  Well-developed, well-nourished, pleasant and cooperative in NAD Head:   Normocephalic and atraumatic. Eyes:  Sclera clear, no icterus.   Conjunctiva pink. Ears:  Normal auditory acuity. Nose:  No deformity, discharge,  or lesions. Mouth:  No deformity or lesions.  Oropharynx pink & moist. Neck:  Supple; no masses or thyromegaly. Lungs:  Clear throughout to auscultation.   No wheezes, crackles, or rhonchi.  No acute distress. Heart:  Regular rate and rhythm; no murmurs, clicks, rubs,  or gallops. Abdomen:  Soft, nontender and nondistended. No masses, hepatosplenomegaly or hernias noted. Normal bowel sounds, without guarding, and without rebound.   Rectal:  Deferred until time of colonoscopy.   Msk:  Symmetrical without gross deformities. Normal posture. Pulses:  Normal pulses noted. Extremities:  Without clubbing or edema. Neurologic:  Alert and  oriented x4;  grossly normal neurologically. Skin:  Intact without significant lesions or rashes. Cervical Nodes:  No significant cervical adenopathy. Psych:  Alert and cooperative. Normal mood and affect.  Intake/Output from previous day:   Intake/Output this shift: Total I/O In: 12.5 [Blood:12.5] Out: -   Lab Results:  Recent Labs  01/08/14 1311  WBC 14.4*  HGB 6.5*  HCT 21.1*  PLT 434*   BMET  Recent Labs  01/08/14 1311  NA 136*  K 3.9  CL 97  CO2 26  GLUCOSE 244*  BUN 12  CREATININE 0.90  CALCIUM 8.8   LFT  Recent Labs  01/08/14 1311  PROT 6.3  ALBUMIN 3.4*  AST 15  ALT 12  ALKPHOS 55  BILITOT 0.3  BILIDIR <0.2  IBILI NOT CALCULATED   PT/INR  Recent Labs  01/08/14 1311  LABPROT 11.9  INR 0.89   Hepatitis Panel No results found for this basename: HEPBSAG, HCVAB, HEPAIGM, HEPBIGM,  in the last 72 hours Additional Labs *  Studies/Results: Dg Chest Portable 1 View  01/08/2014   CLINICAL DATA:  Weakness.  Cough.  EXAM: PORTABLE CHEST - 1 VIEW  COMPARISON:  01/01/2014  FINDINGS: Pulmonary hyperinflation again seen, consistent with COPD. No evidence of pulmonary infiltrate  or edema. No evidence of pleural effusion. Heart size is within normal limits. No mass or lymphadenopathy identified. Right shoulder prosthesis again noted.  IMPRESSION: COPD.  No active disease.   Electronically Signed   By: Earle Gell M.D.   On: 01/08/2014 14:59    Principal Problem:   Anemia Active Problems:   GI bleed   Dysphagia   COPD (chronic obstructive pulmonary disease)   GERD (gastroesophageal reflux disease)   Hyperlipidemia   Type II or unspecified type diabetes mellitus without mention of complication, uncontrolled   Essential hypertension, benign   Recommendations - 1.  transfuse to hemoglobin 8 * 2.  continue PPI therapy 3.  EGD-scheduled for a.m. 4.  colonoscopy if upper endoscopy is not diagnostic for a GI bleeding source**  Assessment - * 1.  microcytic anemia-very likely due to chronic GI blood loss as evidenced by Hemoccult-positive stool.  Occult colonic neoplasm is a consideration although patient does have dysphagia raising the question of a malignant stricture. 2.   dysphagia to solids-likely secondary to stricture, be it malignant or peptic 3.  COPD, diabetes, hypertension-stable1**       Kiowa Hollar D. Deatra Ina, MD, [facility] [phone]    01/08/2014, 4:45 PM

## 2014-01-09 NOTE — Progress Notes (Signed)
UR conmpleted

## 2014-01-09 NOTE — Progress Notes (Signed)
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  Despite dilation I was unable to traverse stricture and examined the stomach and duodenum.  She will require outpatient followup dilation.  She also has Candida esophagitis.  Fluconazole was ordered.  Recommend proceeding with colonoscopy; will schedule in a.m.

## 2014-01-09 NOTE — Op Note (Signed)
[facility] [place], [address]   ENDOSCOPY PROCEDURE REPORT  PATIENT: Amanda, [name]  MR#: [id number] BIRTHDATE: 01-May-1941 , 76  yrs. old GENDER: Female ENDOSCOPIST: [name], MD ASSISTANT:   Sharon Mt, Endo Technician Hilma Favors, RN REFERRED BY: PROCEDURE DATE:  01/09/2014 PROCEDURE:   Esophagoscopy  with balloon dilation ASA CLASS:   Class II INDICATIONS:dysphagia and iron deficiency anemia. MEDICATIONS: These medications were titrated to patient response per physician's verbal order, Fentanyl 50 mcg IV, and Versed 4 mg IV  TOPICAL ANESTHETIC:   Cetacaine Spray  DESCRIPTION OF PROCEDURE:   After the risks benefits and alternatives of the procedure were thoroughly explained, informed consent was obtained.  The     endoscope was introduced through the mouth  and advanced to the gastroesophageal junction , limited by a stricture.   The instrument was slowly withdrawn as the mucosa was carefully examined.    In the distal esophagus at approximately 40 cm there was a very tight concentric stricture.  Surrounding mucosa appeared normal. The 9 mm gastroscope could not traverse the stricture. Numbers 6-7-8-9-10mm low dilators were placed for 30 seconds each. There was significant resistance to the 89 and 10 mm balloons and a small amount of heme.  Despite dilation I was unable to pass the endoscope beyond the stricture. There were multiple adherent white plaques in the more proximal esophagus consistent with Candida esophagitis.     Dilation was then performed at the    COMPLICATIONS: There were no complications. ENDOSCOPIC IMPRESSION: I.   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  Candida esophagitis  RECOMMENDATIONS: 1.  colonoscopy for workup of microcytic anemia and Hemoccult-positive stool 2.  followup endoscopy for repeat  dilatation and examination of the upper GI tract distal to the esophagus  eSigned:  [name], MD 01/09/2014 1:56 PM  BH:ALPF Carlean Purl, MD

## 2014-01-09 NOTE — Progress Notes (Signed)
INITIAL NUTRITION ASSESSMENT  DOCUMENTATION CODES Per approved criteria  -Not Applicable   INTERVENTION: - Resource Breeze TID - Diet advancement per MD - RD to continue to monitor  NUTRITION DIAGNOSIS: Inadequate oral intake related to clear liquid diet as evidenced by diet order.    Goal: Advance diet as tolerated to diabetic diet  Monitor:  Weights, labs, diet advancement   Reason for Assessment: Malnutrition screening tool   73 y.o. female  Admitting Dx: Anemia  ASSESSMENT: Pt with history of COPD emphysema, hypertension, type 2 diabetes mellitus, GERD, hyperlipidemia presented to the ER with anemia. 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   - Pt and family report pt eats 4-6 small well balanced meals/day at home - Pt c/o dysphagia x 15 years with 10 pound unintended weight loss in the past month - Pt reports 2-3 times/week food will get stuck in the bottom of her throat and she will have to vomit it back up. States the worst is cornbread and dry bread.  - Not on any nutritional supplements at home  - Eating a popsicle during visit, did not perform nutrition focused physical exam    Height: Ht Readings from Last 1 Encounters:  01/08/14 _0  (1.549 m)    Weight: Wt Readings from Last 1 Encounters:  01/08/14 109 lb 9.1 oz (49.7 kg)    Ideal Body Weight: 105 lbs   % Ideal Body Weight: 104%   Wt Readings from Last 10 Encounters:  01/08/14 109 lb 9.1 oz (49.7 kg)  01/08/14 109 lb 9.1 oz (49.7 kg)  12/28/13 106 lb (48.081 kg)  12/28/13 105 lb 2 oz (47.684 kg)    Usual Body Weight: 119 lbs   % Usual Body Weight: 91%  BMI:  Body mass index is 20.71 kg/(m^2).  Estimated Nutritional Needs: Kcal: 1250-1450 Protein: 50-60g Fluid: 1.2-1.4L/day  Skin: Trace non-pitting RLE, LLE edema  Diet Order: Clear Liquid  EDUCATION NEEDS: -No education needs identified at this  time   Intake/Output Summary (Last 24 hours) at 01/09/14 1610 Last data filed at 01/09/14 1000  Gross per 24 hour  Intake 1263.33 ml  Output   2150 ml  Net -886.67 ml    Last BM: 6/8  Labs:   Recent Labs Lab 01/08/14 1311 01/09/14 0315  NA 136* 138  K 3.9 3.8  CL 97 101  CO2 26 25  BUN 12 12  CREATININE 0.90 0.94  CALCIUM 8.8 8.6  GLUCOSE 244* 92    CBG (last 3)   Recent Labs  01/08/14 1623 01/08/14 2200 01/09/14 0808  GLUCAP 218* 83 100*    Scheduled Meds: . fluconazole  100 mg Oral Daily  . insulin aspart  0-5 Units Subcutaneous QHS  . insulin aspart  0-9 Units Subcutaneous TID WC  . levalbuterol  0.63 mg Nebulization QID  . mometasone-formoterol  1 puff Inhalation BID  . peg 3350 powder  0.5 kit Oral Once  . [START ON 01/10/2014] peg 3350 powder  0.5 kit Oral Once  . sodium chloride  3 mL Intravenous Q12H    Continuous Infusions: . pantoprozole (PROTONIX) infusion 8 mg/hr (01/09/14 1403)    Past Medical History  Diagnosis Date  . Emphysema lung   . Hyperlipidemia   . GERD (gastroesophageal reflux disease)   . Vitamin D deficiency   . Closed fracture of unspecified part of upper end of humerus   . Complete rupture of  rotator cuff   . Other vitamin B12 deficiency anemia   . Neurogenic bladder, NOS   . COPD (chronic obstructive pulmonary disease)   . Type II or unspecified type diabetes mellitus without mention of complication, uncontrolled     diet controlled  . Essential hypertension, benign     off lisinporil for last 2 months    Past Surgical History  Procedure Laterality Date  . Orif shoulder fracture Right 2011    arthroplasty  . Tonsillectomy and adenoidectomy  age 18 or 60  . Eye surgery Bilateral 2010    both eyes lens replacments    Carlis Stable MS, RD, LDN [phone] Pager [phone] Weekend/After Hours Pager

## 2014-01-10 ENCOUNTER — Encounter (HOSPITAL_COMMUNITY): Payer: Self-pay | Admitting: Gastroenterology

## 2014-01-10 ENCOUNTER — Inpatient Hospital Stay (HOSPITAL_COMMUNITY): Payer: Medicare HMO

## 2014-01-10 ENCOUNTER — Encounter (HOSPITAL_COMMUNITY)
Admission: EM | Disposition: A | Discharge status: Home Health | Payer: Self-pay | Source: Home / Self Care | Attending: Internal Medicine

## 2014-01-10 ENCOUNTER — Encounter: Payer: Medicare HMO | Admitting: Internal Medicine

## 2014-01-10 DIAGNOSIS — K5730 Diverticulosis of large intestine without perforation or abscess without bleeding: Secondary | ICD-10-CM

## 2014-01-10 DIAGNOSIS — I10 Essential (primary) hypertension: Secondary | ICD-10-CM | POA: Diagnosis present

## 2014-01-10 DIAGNOSIS — D490 Neoplasm of unspecified behavior of digestive system: Secondary | ICD-10-CM

## 2014-01-10 DIAGNOSIS — K222 Esophageal obstruction: Secondary | ICD-10-CM

## 2014-01-10 DIAGNOSIS — C189 Malignant neoplasm of colon, unspecified: Secondary | ICD-10-CM | POA: Diagnosis present

## 2014-01-10 HISTORY — PX: COLONOSCOPY: SHX5424

## 2014-01-10 LAB — BASIC METABOLIC PANEL
BUN: 10 mg/dL (ref 6–23)
CHLORIDE: 103 meq/L (ref 96–112)
CO2: 28 meq/L (ref 19–32)
CREATININE: 0.98 mg/dL (ref 0.50–1.10)
Calcium: 8.6 mg/dL (ref 8.4–10.5)
GFR calc Af Amer: 64 mL/min — ABNORMAL LOW (ref >90)
GFR calc non Af Amer: 55 mL/min — ABNORMAL LOW (ref >90)
Glucose, Bld: 97 mg/dL (ref 70–99)
Potassium: 3.8 mEq/L (ref 3.7–5.3)
Sodium: 142 mEq/L (ref 137–147)

## 2014-01-10 LAB — GLUCOSE, CAPILLARY
GLUCOSE-CAPILLARY: 159 mg/dL — AB (ref 70–99)
GLUCOSE-CAPILLARY: 116 mg/dL — AB (ref 70–99)
Glucose-Capillary: 101 mg/dL — ABNORMAL HIGH (ref 70–99)
Glucose-Capillary: 112 mg/dL — ABNORMAL HIGH (ref 70–99)
Glucose-Capillary: 141 mg/dL — ABNORMAL HIGH (ref 70–99)
Glucose-Capillary: 94 mg/dL (ref 70–99)

## 2014-01-10 LAB — CBC
HEMATOCRIT: 30.8 % — AB (ref 36.0–46.0)
Hemoglobin: 9.9 g/dL — ABNORMAL LOW (ref 12.0–15.0)
MCH: 25.2 pg — ABNORMAL LOW (ref 26.0–34.0)
MCHC: 32.1 g/dL (ref 30.0–36.0)
MCV: 78.4 fL (ref 78.0–100.0)
Platelets: 329 10*3/uL (ref 150–400)
RBC: 3.93 MIL/uL (ref 3.87–5.11)
RDW: 19.4 % — AB (ref 11.5–15.5)
WBC: 13.2 10*3/uL — AB (ref 4.0–10.5)

## 2014-01-10 SURGERY — EGD (ESOPHAGOGASTRODUODENOSCOPY)
Anesthesia: Moderate Sedation

## 2014-01-10 SURGERY — COLONOSCOPY
Anesthesia: Moderate Sedation

## 2014-01-10 MED ORDER — ZOLPIDEM TARTRATE 5 MG PO TABS
5.0000 mg | ORAL_TABLET | Freq: Once | ORAL | Status: AC
  Administered 2014-01-10: 5 mg via ORAL
  Filled 2014-01-10: qty 1

## 2014-01-10 MED ORDER — MIDAZOLAM HCL 10 MG/2ML IJ SOLN
INTRAMUSCULAR | Status: AC
  Filled 2014-01-10: qty 2

## 2014-01-10 MED ORDER — FENTANYL CITRATE 0.05 MG/ML IJ SOLN
INTRAMUSCULAR | Status: DC | PRN
  Administered 2014-01-10: 25 ug via INTRAVENOUS
  Administered 2014-01-10: 12.5 ug via INTRAVENOUS
  Administered 2014-01-10: 25 ug via INTRAVENOUS

## 2014-01-10 MED ORDER — MIDAZOLAM HCL 5 MG/5ML IJ SOLN
INTRAMUSCULAR | Status: DC | PRN
  Administered 2014-01-10: 2 mg via INTRAVENOUS
  Administered 2014-01-10 (×3): 1 mg via INTRAVENOUS

## 2014-01-10 MED ORDER — IOHEXOL 300 MG/ML  SOLN
25.0000 mL | INTRAMUSCULAR | Status: AC
  Administered 2014-01-10 (×2): 25 mL via ORAL

## 2014-01-10 MED ORDER — SPOT INK MARKER SYRINGE KIT
PACK | SUBMUCOSAL | Status: AC
  Filled 2014-01-10: qty 5

## 2014-01-10 MED ORDER — SPOT INK MARKER SYRINGE KIT
PACK | SUBMUCOSAL | Status: DC | PRN
  Administered 2014-01-10: 1 mL via SUBMUCOSAL

## 2014-01-10 MED ORDER — IOHEXOL 300 MG/ML  SOLN
80.0000 mL | Freq: Once | INTRAMUSCULAR | Status: AC | PRN
  Administered 2014-01-10: 80 mL via INTRAVENOUS

## 2014-01-10 MED ORDER — SODIUM CHLORIDE 0.9 % IV SOLN
INTRAVENOUS | Status: DC
  Administered 2014-01-12: 12:00:00 via INTRAVENOUS

## 2014-01-10 MED ORDER — AMLODIPINE BESYLATE 5 MG PO TABS
5.0000 mg | ORAL_TABLET | Freq: Every day | ORAL | Status: DC
  Administered 2014-01-10 – 2014-01-11 (×2): 5 mg via ORAL
  Filled 2014-01-10 (×3): qty 1

## 2014-01-10 MED ORDER — FENTANYL CITRATE 0.05 MG/ML IJ SOLN
INTRAMUSCULAR | Status: AC
  Filled 2014-01-10: qty 2

## 2014-01-10 NOTE — Progress Notes (Signed)
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  There is a large polyp in the hepatic flexure that was marked but not removed.  In addition, there is a large rectal polyp that was biopsied but not removed.  Benign-appearing polyps in the mid transverse colon and descending colon were removed.  Plan staging CT of the chest abdomen and pelvis.  Surgery will be consulted for a right hemicolectomy and removal of the rectal polyp.

## 2014-01-10 NOTE — H&P (View-Only) (Signed)
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  Despite dilation I was unable to traverse stricture and examined the stomach and duodenum.  She will require outpatient followup dilation.  She also has Candida esophagitis.  Fluconazole was ordered.  Recommend proceeding with colonoscopy; will schedule in a.m.

## 2014-01-10 NOTE — Progress Notes (Addendum)
Progress Note   Amanda [name] CLE:[id number] DOB: 1941-07-12 DOA: 01/08/2014 PCP: Tamsen Roers, MD   Brief Narrative:   Amanda [name] is an 73 y.o. female with a PMH of COPD emphysema, hypertension, type 2 diabetes mellitus, GERD, hyperlipidemia who was admitted 01/08/14 after presenting to the ER with complaints of fatigue, generalized weakness, and a four-week history of increasing shortness of breath. Patient was seen in the ED a week ago for dyspnea and had a negative CT angiogram of the chest for PE. She was treated for COPD exacerbation with steroids. At that time, she was found to have a hemoglobin of 7.5, however the fecal occult test was negative. She was then started on iron supplementation and was DC'd home. She saw her PCP 01/08/14 and was sent to the ER for evaluation of anemia with a hemoglobin of 6.5. Patient has noticed black stools, attributed to iron supplementation however a stool occult test is positive in the ED. Patient denies any excessive NSAID use. She has noticed difficulty swallowing in the last 1 month. She has lost about 10 lbs from not eating enough due to dysphagia. Patient was seen by Dr. Carlean Purl on 12/28/13 and recommended EGD, per patient scheduled on on 01/10/14. Patient also reports that she never had any endoscopy or colonoscopy ever.  Assessment/Plan:   Principal Problem: Acute on chronic anemia secondary to GI bleeding in the setting of newly diagnosed colon cancer  The patient was admitted to the SDU and given 2 units of PRBCs. Post transfusion hemoglobin was 9.5 mg/dL.  Stable over past 24 hours.  Seen by Dr. Deatra Ina of gastroenterology on 01/08/14 with EGD/possible colonoscopy scheduled for today.  Continue PPI therapy.  Continue serial hemoglobin checks, transfuse as needed to keep hemoglobin greater than 8 mg/dL.  S/P colonoscopy 01/10/14, found to have a 4 cm cecal mass and multiple polyps.  Surgery consulted by GI.  Staging CT of abdomen and pelvis  ordered.  Had CT chest done already on 01/01/14, so would not repeat.  Active Problems: Malignant hypertension  Marked elevation of BP noted with high reading of 214/143.  Continue PRN hydralazine, add daily Norvasc.  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  Continue Diflucan.  F/U EGD for repeat dilation and examination of the upper GI tract distal to esophagus recommended.  COPD (chronic obstructive pulmonary disease)  Stable after completing prednisone taper.  Bronchodilator therapy with Xopenex and Dulera ordered.  Supplemental oxygen as needed.  GERD (gastroesophageal reflux disease)  Continue PPI therapy.  Hyperlipidemia  Continue statin therapy.    Type II or unspecified type diabetes mellitus without mention of complication, uncontrolled  Continue insulin sensitive sliding scale. CBGs 83-210.    Essential hypertension, benign  Continue hydralazine when necessary.  DVT Prophylaxis  Continue SCDs.  Code Status: Full. Family Communication: Daughter at the bedside. Disposition Plan: Home when stable.   IV Access:    Peripheral IV   Procedures:    EGD 01/09/14: Tight peptic stricture in the distal esophagus, s/p balloon dilation to 10 mm; Candida esophagitis.  Colonoscopy 01/10/14: 1. Bleeding malignant tumor/mass at the ileocecal valve; multiple biopsies were performed 2. Sessile polyp measuring 2-3 cm in size was found at the hepatic flexure - not removed 3. Sessile polyp measuring 6 mm in size was found at the hepatic flexure - not removed 4. Two sessile polyps measuring 3 and  4 mm in size were found in the transverse colon; polypectomy was performed 5. Sessile polyp measuring 3 mm in size was found in the descending colon; polypectomy was performed 6. Sessile polyp measuring 2 cm in size was found in the rectum;  multiple biopsies were performed - not removed 7. There was severe diverticulosis noted in the sigmoid colon  Medical Consultants:    Dr. Erskine Emery, Gastroenterology.  Surgery.   Other Consultants:    None.   Anti-Infectives:    None.  Subjective:   Amanda [name] is a bit sedated post colonoscopy.  No current complaints of dyspnea.  Knows about her colonoscopy results.  Objective:    Filed Vitals:   01/10/14 1050 01/10/14 1055 01/10/14 1100 01/10/14 1107  BP: 183/83 172/74 166/48 156/35  Pulse: 76 82 72 70  Temp:    98.5 F (36.9 C)  TempSrc:    Oral  Resp: 20 17 17 30   Height:      Weight:      SpO2: 100% 100% 100% 100%    Intake/Output Summary (Last 24 hours) at 01/10/14 1359 Last data filed at 01/10/14 1300  Gross per 24 hour  Intake   1500 ml  Output   1700 ml  Net   -200 ml    Exam: Gen:  NAD Cardiovascular:  RRR, No M/R/G Respiratory:  Lungs CTAB but tachypneic Gastrointestinal:  Abdomen soft, NT/ND, + BS Extremities:  No C/E/C   Data Reviewed:    Labs: Basic Metabolic Panel:  Recent Labs Lab 01/08/14 1311 01/09/14 0315 01/10/14 0353  NA 136* 138 142  K 3.9 3.8 3.8  CL 97 101 103  CO2 26 25 28   GLUCOSE 244* 92 97  BUN 12 12 10   CREATININE 0.90 0.94 0.98  CALCIUM 8.8 8.6 8.6   GFR Estimated Creatinine Clearance: 37.4 ml/min (by C-G formula based on Cr of 0.98). Liver Function Tests:  Recent Labs Lab 01/08/14 1311  AST 15  ALT 12  ALKPHOS 55  BILITOT 0.3  PROT 6.3  ALBUMIN 3.4*   Coagulation profile  Recent Labs Lab 01/08/14 1311  INR 0.89    CBC:  Recent Labs Lab 01/08/14 1311 01/09/14 0017 01/09/14 0315 01/09/14 0759 01/09/14 1610 01/10/14 0353  WBC 14.4*  --  10.8*  --   --  13.2*  NEUTROABS 13.7*  --   --   --   --   --   HGB 6.5* 9.5* 9.8* 9.5* 10.5* 9.9*  HCT 21.1* 29.1* 29.5* 28.6* 32.3* 30.8*  MCV 72.3*  --  77.6*  --   --  78.4  PLT 434*  --  322  --   --  329   BNP (last 3  results)  Recent Labs  01/01/14 1100  PROBNP 1711.0*   CBG:  Recent Labs Lab 01/09/14 1610 01/10/14 0013 01/10/14 0404 01/10/14 0807 01/10/14 1229  GLUCAP 156* 112* 94 159* 101*   Anemia work up:  Recent Labs  01/08/14 1311 01/08/14 1408  VITAMINB12  --  669  FOLATE  --  >20.0  FERRITIN  --  7*  TIBC  --  370  IRON  --  57  RETICCTPCT 3.4*  --    Sepsis Labs:  Recent Labs Lab 01/08/14 1311 01/09/14 0315 01/10/14 0353  WBC 14.4* 10.8* 13.2*   Microbiology Recent Results (from the past 240 hour(s))  MRSA PCR SCREENING     Status: None   Collection Time    01/08/14  4:26  PM      Result Value Ref Range Status   MRSA by PCR NEGATIVE  NEGATIVE Final   Comment:            The GeneXpert MRSA Assay (FDA     approved for NASAL specimens     only), is one component of a     comprehensive MRSA colonization     surveillance program. It is not     intended to diagnose MRSA     infection nor to guide or     monitor treatment for     MRSA infections.  URINE CULTURE     Status: None   Collection Time    01/08/14  5:28 PM      Result Value Ref Range Status   Specimen Description URINE, CLEAN CATCH   Final   Special Requests NONE   Final   Culture  Setup Time     Final   Value: 01/08/2014 22:44     Performed at [place]     Final   Value: NO GROWTH     Performed at [facility]   Culture     Final   Value: NO GROWTH     Performed at [facility]   Report Status 01/09/2014 FINAL   Final     Radiographs/Studies:   Dg Chest Portable 1 View  01/08/2014   CLINICAL DATA:  Weakness.  Cough.  EXAM: PORTABLE CHEST - 1 VIEW  COMPARISON:  01/01/2014  FINDINGS: Pulmonary hyperinflation again seen, consistent with COPD. No evidence of pulmonary infiltrate or edema. No evidence of pleural effusion. Heart size is within normal limits. No mass or lymphadenopathy identified. Right shoulder prosthesis again noted.  IMPRESSION: COPD.   No active disease.   Electronically Signed   By: Earle Gell M.D.   On: 01/08/2014 14:59    Medications:   . feeding supplement (RESOURCE BREEZE)  1 Container Oral TID BM  . fluconazole  100 mg Oral Daily  . insulin aspart  0-5 Units Subcutaneous QHS  . insulin aspart  0-9 Units Subcutaneous TID WC  . levalbuterol  0.63 mg Nebulization QID  . mometasone-formoterol  1 puff Inhalation BID  . sodium chloride  3 mL Intravenous Q12H   Continuous Infusions: . sodium chloride 20 mL/hr at 01/09/14 1834  . sodium chloride    . pantoprozole (PROTONIX) infusion 8 mg/hr (01/10/14 0514)    Time spent: 35 minutes with > 50% of time discussing current diagnostic test results, clinical impression and plan of care including teach back.    LOS: 2 days   Kamayah Pillay  [facility] Pager [phone]. If unable to reach me by pager, please call my cell phone at [phone].  *Please refer to [email or web address], password [email or web address] to get updated schedule on who will round on this patient, as hospitalists switch teams weekly. If 7PM-7AM, please contact night-coverage at [email or web address], password [email or web address] for any overnight needs.  01/10/2014, 1:59 PM    **Disclaimer: This note was dictated with voice recognition software. Similar sounding words can inadvertently be transcribed and this note may contain transcription errors which may not have been corrected upon publication of note.**

## 2014-01-10 NOTE — Consult Note (Signed)
Patient seen and examined.  Chart reviewed.  Discussed with Dr. Deatra Ina.  I have recommended laparoscopic partial colectomy and transanal excision of rectal polyp tentatively on 01/12/14 if she would be ready from a medical standpoint.  Pulmonary consult is pending.  I have explained the procedure and risks of colon resection.  Risks include but are not limited to bleeding, infection, wound problems, anesthesia, anastomotic leak, need for colostomy, injury to intraabominal organs (such as intestine, spleen, kidney, bladder, ureter, etc.), ileus, irregular bowel habits.  She seems to understand.

## 2014-01-10 NOTE — Interval H&P Note (Signed)
History and Physical Interval Note:  01/10/2014 10:18 AM  Amanda [name]  has presented today for surgery, with the diagnosis of Anemia  The various methods of treatment have been discussed with the patient and family. After consideration of risks, benefits and other options for treatment, the patient has consented to  Procedure(s): COLONOSCOPY (N/A) as a surgical intervention .  The patient's history has been reviewed, patient examined, no change in status, stable for surgery.  I have reviewed the patient's chart and labs.  Questions were answered to the patient's satisfaction.    The recent H&P (dated *01/09/14**) was reviewed, the patient was examined and there is no change in the patients condition since that H&P was completed.   Erskine Emery  01/10/2014, 10:19 AM    Erskine Emery

## 2014-01-10 NOTE — Op Note (Signed)
[facility] [place], [id number]   COLONOSCOPY PROCEDURE REPORT  PATIENT: Amanda [name], Amanda [name]  MR#: [id number] BIRTHDATE: [birth year] , 30  yrs. old GENDER: Female ENDOSCOPIST: [name], MD REFERRED BY: PROCEDURE DATE:  01/10/2014 PROCEDURE:   Colonoscopy with biopsy, Colonoscopy with snare polypectomy, and Submucosal injection, any substance ASA CLASS:   Class III INDICATIONS:Iron Deficiency Anemia. MEDICATIONS: These medications were titrated to patient response per physician's verbal order, Versed 5 mg IV, and Propofol (Diprivan) 62.5 mcg IV  DESCRIPTION OF PROCEDURE:   After the risks benefits and alternatives of the procedure were thoroughly explained, informed consent was obtained.  A digital rectal exam revealed a palpable rectal mass 2-3centimeters from the anal verge.   The Pentax Colonoscope [id number]  endoscope was introduced through the anus and advanced to the cecum, which was identified by both the appendix and ileocecal valve. No adverse events experienced.   The quality of the prep was good, using MiraLax  The instrument was then slowly withdrawn as the colon was fully examined.      COLON FINDINGS: An infiltrative and non-obstructing bleeding malignant tumor/mass, measuring 4cm in length, was seen at the ileocecal valve.  Multiple biopsies were performed.   A sessile polyp measuring 2-3 cm in size was found at the hepatic flexure. This was not removed since area will be resected at surgery.  The mucosa just distal to the sessile polyp was injected with 5 cc of spot for identification.  A sessile polyp measuring 6 mm in size was found at the hepatic flexure.   Two sessile polyps measuring 3 and 4 mm in size were found in the transverse colon.  A polypectomy was performed.  The resection was complete and the polyp tissue was completely retrieved.   A sessile polyp measuring 3 mm in size was found in the descending colon.  A  polypectomy was performed.  The resection was complete and the polyp tissue was completely retrieved.   A sessile polyp measuring 2 cm in size was found in the rectum.  Multiple biopsies were performed.   There was severe diverticulosis noted in the sigmoid colon with associated angulation and muscular hypertrophy.  Retroflexed views revealed no abnormalities. The time to cecum=  .  Withdrawal time=18 minutes 0 seconds.  The scope was withdrawn and the procedure completed. COMPLICATIONS: There were no complications.  ENDOSCOPIC IMPRESSION: 1.   Bleeding malignant tumor/mass at the ileocecal valve; multiple biopsies were performed 2.   Sessile polyp measuring 2-3 cm in size was found at the hepatic flexure - not removed 3.   Sessile polyp measuring 6 mm in size was found at the hepatic flexure - not removed 4.   Two sessile polyps measuring 3 and 4 mm in size were found in the transverse colon; polypectomy was performed 5.   Sessile polyp measuring 3 mm in size was found in the descending colon; polypectomy was performed 6.   Sessile polyp measuring 2 cm in size was found in the rectum; multiple biopsies were performed - not removed 7.   There was severe diverticulosis noted in the sigmoid colon  RECOMMENDATIONS: 1.  staging CT of the chest, abdomen and pelvis 2.  surgery consult for right hemicolectomy, removal of rectal polyp 3.  colonoscopy one year  eSigned:  [name], MD 01/10/2014 11:18 AM   cc: Silvano Rusk, MD   PATIENT NAME:  Amanda [name], Amanda [name] MR#: [id number]

## 2014-01-10 NOTE — Consult Note (Signed)
Amanda [name] Oct 25, 1940  [id number].   Primary Care MD: Dr. Tamsen Roers Requesting MD: Dr. Erskine Emery Chief Complaint/Reason for Consult: cecal mass HPI: This is a 73 yo female with COPD, HTN, and DM, who has been found to have anemia.  She has been having dyspnea that has been worsening over the last month or so.  She was seen by Dr. Carlean Purl due to dysphagia.  Prior to EGD, she was sent to Dr. Lake Bells for evaluation of her COPD prior to EGD.  He felt she was having a COPD exacerbation and was put on a steroid taper.  She saw, I believe, her PCP on Monday due to worsening weakness, fatigue, and SOB, who was sent to [facility] where she was found to have a hgb of 6.5.  She was noted to be hemoccult positive and was admitted.  She underwent a colonoscopy today and was found to have a mass at her ICV along with a large polyp at her hepatic flexure and 2 cm from her anal verge.  These other two polyps are felt to be sessile and likely not malignant.    She has noticed about 10lbs of unintentional weight loss in the last month.  She has never had a colonoscopy.  She denies any abdominal pain or change in caliber of her stools or color.  We have been asked to evaluate the patient for surgical recommendations.  ROS: Please see HPI, otherwise negative  Family History  Problem Relation Age of Onset  . Diabetes Father   . Bone cancer Father     bone marrow  . Emphysema Mother   . Pneumonia Mother   no family history of colon or gastrointestinal malignancies   Past Medical History  Diagnosis Date  . Emphysema lung   . Hyperlipidemia   . GERD (gastroesophageal reflux disease)   . Vitamin D deficiency   . Closed fracture of unspecified part of upper end of humerus   . Complete rupture of rotator cuff   . Other vitamin B12 deficiency anemia   . Neurogenic bladder, NOS   . COPD (chronic obstructive pulmonary disease)   . Type II or unspecified type diabetes mellitus without mention of complication,  uncontrolled     diet controlled  . Essential hypertension, benign     off lisinporil for last 2 months    Past Surgical History  Procedure Laterality Date  . Orif shoulder fracture Right 2011    arthroplasty  . Tonsillectomy and adenoidectomy  age 68 or 54  . Eye surgery Bilateral 2010    both eyes lens replacments  . Esophagogastroduodenoscopy (egd) with propofol N/A 01/09/2014    Procedure: ESOPHAGOGASTRODUODENOSCOPY (EGD) WITH PROPOFOL;  Surgeon: [name], MD;  Location: [facility];  Service: Endoscopy;  Laterality: N/A;    Social History:  reports that she quit smoking about 8 years ago. Her smoking use included Cigarettes. She has a 20 pack-year smoking history. She has never used smokeless tobacco. She reports that she does not drink alcohol or use illicit drugs.  Allergies: No Known Allergies  Medications Prior to Admission  Medication Sig Dispense Refill  . albuterol (PROAIR HFA) 108 (90 BASE) MCG/ACT inhaler Inhale 2 puffs into the lungs 4 (four) times daily.      Marland Kitchen albuterol (PROVENTIL) (2.5 MG/3ML) 0.083% nebulizer solution Take 2.5 mg by nebulization 4 (four) times daily.      . ferrous sulfate 325 (65 FE) MG tablet Take 1 tablet (325 mg total) by mouth  daily.  30 tablet  0  . mometasone-formoterol (DULERA) 100-5 MCG/ACT AERO Inhale 1 puff into the lungs 2 (two) times daily.      Marland Kitchen omeprazole (PRILOSEC) 40 MG capsule Take 40 mg by mouth every morning.       . predniSONE (DELTASONE) 10 MG tablet Take 29m daily for 3 days, then take 45mdaily for three days, then take 204maily for three days, then take 39m59mily for three days  45 tablet  0    Blood pressure 127/38, pulse 73, temperature 98.5 F (36.9 C), temperature source Oral, resp. rate 18, height 5' 1" (1.549 m), weight 109 lb 9.1 oz (49.7 kg), SpO2 92.00%. Physical Exam: General: pleasant, WD, WN white female who is laying in bed in NAD HEENT: head is normocephalic, atraumatic.  Sclera are noninjected.   PERRL.  Ears and nose without any masses or lesions.  Mouth is pink and moist Heart: regular, rate, and rhythm.  Normal s1,s2. No obvious murmurs, gallops, or rubs noted.  Palpable radial and pedal pulses bilaterally Lungs: CTAB, no wheezes, rhonchi, or rales noted.  Respiratory effort nonlabored Abd: soft, NT, ND, +BS, no masses, hernias, or organomegaly MS: all 4 extremities are symmetrical with no cyanosis, clubbing, or edema. Skin: warm and dry with no masses, lesions, or rashes Psych: A&Ox3 with an appropriate affect.    Results for orders placed during the hospital encounter of 01/08/14 (from the past 48 hour(s))  GLUCOSE, CAPILLARY     Status: Abnormal   Collection Time    01/08/14  4:23 PM      Result Value Ref Range   Glucose-Capillary 218 (*) 70 - 99 mg/dL   Comment 1 Documented in Chart     Comment 2 Notify RN    MRSA PCR SCREENING     Status: None   Collection Time    01/08/14  4:26 PM      Result Value Ref Range   MRSA by PCR NEGATIVE  NEGATIVE   Comment:            The GeneXpert MRSA Assay (FDA     approved for NASAL specimens     only), is one component of a     comprehensive MRSA colonization     surveillance program. It is not     intended to diagnose MRSA     infection nor to guide or     monitor treatment for     MRSA infections.  URINE CULTURE     Status: None   Collection Time    01/08/14  5:28 PM      Result Value Ref Range   Specimen Description URINE, CLEAN CATCH     Special Requests NONE     Culture  Setup Time       Value: 01/08/2014 22:44     Performed at [facility]       Value: NO GROWTH     Performed at [facility]       Value: NO GROWTH     Performed at [facility] Status 01/09/2014 FINAL    PREPARE RBC (CROSSMATCH)     Status: None   Collection Time    01/08/14  8:00 PM      Result Value Ref Range   Order Confirmation ORDER PROCESSED BY BLOOD BANK    GLUCOSE, CAPILLARY      Status: None   Collection  Time    01/08/14 10:00 PM      Result Value Ref Range   Glucose-Capillary 83  70 - 99 mg/dL   Comment 1 Documented in Chart     Comment 2 Notify RN    HEMOGLOBIN AND HEMATOCRIT, BLOOD     Status: Abnormal   Collection Time    01/09/14 12:17 AM      Result Value Ref Range   Hemoglobin 9.5 (*) 12.0 - 15.0 g/dL   Comment: DELTA CHECK NOTED     REPEATED TO VERIFY   HCT 29.1 (*) 36.0 - 51.0 %  BASIC METABOLIC PANEL     Status: Abnormal   Collection Time    01/09/14  3:15 AM      Result Value Ref Range   Sodium 138  137 - 147 mEq/L   Potassium 3.8  3.7 - 5.3 mEq/L   Chloride 101  96 - 112 mEq/L   CO2 25  19 - 32 mEq/L   Glucose, Bld 92  70 - 99 mg/dL   BUN 12  6 - 23 mg/dL   Creatinine, Ser 0.94  0.50 - 1.10 mg/dL   Calcium 8.6  8.4 - 10.5 mg/dL   GFR calc non Af Amer 58 (*) >90 mL/min   GFR calc Af Amer 67 (*) >90 mL/min   Comment: (NOTE)     The eGFR has been calculated using the CKD EPI equation.     This calculation has not been validated in all clinical situations.     eGFR's persistently <90 mL/min signify possible Chronic Kidney     Disease.  CBC     Status: Abnormal   Collection Time    01/09/14  3:15 AM      Result Value Ref Range   WBC 10.8 (*) 4.0 - 10.5 K/uL   RBC 3.80 (*) 3.87 - 5.11 MIL/uL   Hemoglobin 9.8 (*) 12.0 - 15.0 g/dL   HCT 29.5 (*) 36.0 - 46.0 %   MCV 77.6 (*) 78.0 - 100.0 fL   Comment: DELTA CHECK NOTED     REPEATED TO VERIFY   MCH 25.8 (*) 26.0 - 34.0 pg   MCHC 33.2  30.0 - 36.0 g/dL   RDW 18.9 (*) 11.5 - 15.5 %   Platelets 322  150 - 400 K/uL   Comment: DELTA CHECK NOTED     REPEATED TO VERIFY  HEMOGLOBIN A1C     Status: Abnormal   Collection Time    01/09/14  3:15 AM      Result Value Ref Range   Hemoglobin A1C 6.8 (*) <5.7 %   Comment: (NOTE)                                                                               According to the ADA Clinical Practice Recommendations for 2011, when     HbA1c is used as  a screening test:      >=6.5%   Diagnostic of Diabetes Mellitus               (if abnormal result is confirmed)     5.7-6.4%   Increased risk of developing Diabetes Mellitus  References:Diagnosis and Classification of Diabetes Mellitus,Diabetes     FVCB,4496,75(FFMBW 1):S62-S69 and Standards of Medical Care in             Diabetes - 2011,Diabetes GYKZ,9935,70 (Suppl 1):S11-S61.   Mean Plasma Glucose 148 (*) <117 mg/dL   Comment: Performed at [place], BLOOD     Status: Abnormal   Collection Time    01/09/14  7:59 AM      Result Value Ref Range   Hemoglobin 9.5 (*) 12.0 - 15.0 g/dL   HCT 28.6 (*) 36.0 - 46.0 %  GLUCOSE, CAPILLARY     Status: Abnormal   Collection Time    01/09/14  8:08 AM      Result Value Ref Range   Glucose-Capillary 100 (*) 70 - 99 mg/dL   Comment 1 Documented in Chart     Comment 2 Notify RN    HEMOGLOBIN AND HEMATOCRIT, BLOOD     Status: Abnormal   Collection Time    01/09/14  4:10 PM      Result Value Ref Range   Hemoglobin 10.5 (*) 12.0 - 15.0 g/dL   HCT 32.3 (*) 36.0 - 46.0 %  GLUCOSE, CAPILLARY     Status: Abnormal   Collection Time    01/09/14  4:10 PM      Result Value Ref Range   Glucose-Capillary 156 (*) 70 - 99 mg/dL   Comment 1 Documented in Chart     Comment 2 Notify RN    GLUCOSE, CAPILLARY     Status: Abnormal   Collection Time    01/10/14 12:13 AM      Result Value Ref Range   Glucose-Capillary 112 (*) 70 - 99 mg/dL  BASIC METABOLIC PANEL     Status: Abnormal   Collection Time    01/10/14  3:53 AM      Result Value Ref Range   Sodium 142  137 - 147 mEq/L   Potassium 3.8  3.7 - 5.3 mEq/L   Chloride 103  96 - 112 mEq/L   CO2 28  19 - 32 mEq/L   Glucose, Bld 97  70 - 99 mg/dL   BUN 10  6 - 23 mg/dL   Creatinine, Ser 0.98  0.50 - 1.10 mg/dL   Calcium 8.6  8.4 - 10.5 mg/dL   GFR calc non Af Amer 55 (*) >90 mL/min   GFR calc Af Amer 64 (*) >90 mL/min   Comment: (NOTE)     The eGFR has been  calculated using the CKD EPI equation.     This calculation has not been validated in all clinical situations.     eGFR's persistently <90 mL/min signify possible Chronic Kidney     Disease.  CBC     Status: Abnormal   Collection Time    01/10/14  3:53 AM      Result Value Ref Range   WBC 13.2 (*) 4.0 - 10.5 K/uL   RBC 3.93  3.87 - 5.11 MIL/uL   Hemoglobin 9.9 (*) 12.0 - 15.0 g/dL   HCT 30.8 (*) 36.0 - 46.0 %   MCV 78.4  78.0 - 100.0 fL   MCH 25.2 (*) 26.0 - 34.0 pg   MCHC 32.1  30.0 - 36.0 g/dL   RDW 19.4 (*) 11.5 - 15.5 %   Platelets 329  150 - 400 K/uL  GLUCOSE, CAPILLARY     Status: None   Collection Time    01/10/14  4:04 AM      Result Value Ref Range   Glucose-Capillary 94  70 - 99 mg/dL  GLUCOSE, CAPILLARY     Status: Abnormal   Collection Time    01/10/14  8:07 AM      Result Value Ref Range   Glucose-Capillary 159 (*) 70 - 99 mg/dL  GLUCOSE, CAPILLARY     Status: Abnormal   Collection Time    01/10/14 12:29 PM      Result Value Ref Range   Glucose-Capillary 101 (*) 70 - 99 mg/dL   Dg Chest Portable 1 View  01/08/2014   CLINICAL DATA:  Weakness.  Cough.  EXAM: PORTABLE CHEST - 1 VIEW  COMPARISON:  01/01/2014  FINDINGS: Pulmonary hyperinflation again seen, consistent with COPD. No evidence of pulmonary infiltrate or edema. No evidence of pleural effusion. Heart size is within normal limits. No mass or lymphadenopathy identified. Right shoulder prosthesis again noted.  IMPRESSION: COPD.  No active disease.   Electronically Signed   By: Earle Gell M.D.   On: 01/08/2014 14:59       Assessment/Plan 1. Cecal mass, likely malignant 2. 2 sessile type polyps, hepatic flexure and rectum 3. COPD 4. HTN 5. Anemia secondary to #1 6. DM  Plan: 1. Agree with pulmonary consult to evaluate COPD for pre-operative clearance 2. Cont on clear liquid diets so we do not have to reprep 3. Check CEA level 4. Agree with staging CT C/A/P scans to evaluate for metastatic disease 5.  Tentatively plan for possible surgery on Friday.  Monique Hefty E 01/10/2014, 2:33 PM Pager: [phone]

## 2014-01-11 ENCOUNTER — Encounter (HOSPITAL_COMMUNITY): Payer: Self-pay | Admitting: Gastroenterology

## 2014-01-11 DIAGNOSIS — J439 Emphysema, unspecified: Secondary | ICD-10-CM

## 2014-01-11 DIAGNOSIS — J438 Other emphysema: Secondary | ICD-10-CM

## 2014-01-11 DIAGNOSIS — Z01811 Encounter for preprocedural respiratory examination: Secondary | ICD-10-CM

## 2014-01-11 LAB — BASIC METABOLIC PANEL
BUN: 9 mg/dL (ref 6–23)
CO2: 26 meq/L (ref 19–32)
Calcium: 8.8 mg/dL (ref 8.4–10.5)
Chloride: 102 mEq/L (ref 96–112)
Creatinine, Ser: 1.02 mg/dL (ref 0.50–1.10)
GFR calc Af Amer: 61 mL/min — ABNORMAL LOW (ref >90)
GFR, EST NON AFRICAN AMERICAN: 52 mL/min — AB (ref >90)
GLUCOSE: 174 mg/dL — AB (ref 70–99)
Potassium: 3.4 mEq/L — ABNORMAL LOW (ref 3.7–5.3)
Sodium: 139 mEq/L (ref 137–147)

## 2014-01-11 LAB — CEA: CEA: 1.4 ng/mL (ref 0.0–5.0)

## 2014-01-11 LAB — CBC
HEMATOCRIT: 29.7 % — AB (ref 36.0–46.0)
HEMOGLOBIN: 9.5 g/dL — AB (ref 12.0–15.0)
MCH: 25 pg — AB (ref 26.0–34.0)
MCHC: 32 g/dL (ref 30.0–36.0)
MCV: 78.2 fL (ref 78.0–100.0)
Platelets: 312 10*3/uL (ref 150–400)
RBC: 3.8 MIL/uL — AB (ref 3.87–5.11)
RDW: 20.6 % — ABNORMAL HIGH (ref 11.5–15.5)
WBC: 12.7 10*3/uL — ABNORMAL HIGH (ref 4.0–10.5)

## 2014-01-11 LAB — GLUCOSE, CAPILLARY
GLUCOSE-CAPILLARY: 196 mg/dL — AB (ref 70–99)
Glucose-Capillary: 144 mg/dL — ABNORMAL HIGH (ref 70–99)
Glucose-Capillary: 280 mg/dL — ABNORMAL HIGH (ref 70–99)
Glucose-Capillary: 111 mg/dL — ABNORMAL HIGH (ref 70–99)

## 2014-01-11 MED ORDER — ZOLPIDEM TARTRATE 5 MG PO TABS
5.0000 mg | ORAL_TABLET | Freq: Once | ORAL | Status: AC
  Administered 2014-01-11: 5 mg via ORAL
  Filled 2014-01-11: qty 1

## 2014-01-11 MED ORDER — DEXTROSE 5 % IV SOLN: 1.0000 g | Freq: Two times a day (BID) | INTRAVENOUS | Status: DC

## 2014-01-11 MED ORDER — DEXTROSE 5 % IV SOLN
1.0000 g | Freq: Two times a day (BID) | INTRAVENOUS | Status: DC
  Filled 2014-01-11: qty 1

## 2014-01-11 MED ORDER — ALVIMOPAN 12 MG PO CAPS
12.0000 mg | ORAL_CAPSULE | Freq: Once | ORAL | Status: DC
Start: 2014-01-12 — End: 2014-01-12
  Filled 2014-01-11: qty 1

## 2014-01-11 NOTE — Progress Notes (Signed)
1 Day Post-Op  Subjective: Has not been seen by Pulmonary yet.  Husband in room.  Objective: Vital signs in last 24 hours: Temp:  [97.7 F (36.5 C)-98.5 F (36.9 C)] 98.2 F (36.8 C) (06/11 0518) Pulse Rate:  [28-124] 77 (06/11 0518) Resp:  [13-30] 20 (06/11 0518) BP: (127-183)/(26-147) 144/66 mmHg (06/11 0518) SpO2:  [92 %-100 %] 97 % (06/11 0518) Last BM Date: 01/10/14  Intake/Output from previous day: 06/10 0701 - 06/11 0700 In: 2325 [P.O.:840; I.V.:1485] Out: 1300 [Urine:1300] Intake/Output this shift:    PE: General- In NAD Abdomen-soft, nontender  Lab Results:   Recent Labs  01/10/14 0353 01/11/14 0320  WBC 13.2* 12.7*  HGB 9.9* 9.5*  HCT 30.8* 29.7*  PLT 329 312   BMET  Recent Labs  01/10/14 0353 01/11/14 0320  NA 142 139  K 3.8 3.4*  CL 103 102  CO2 28 26  GLUCOSE 97 174*  BUN 10 9  CREATININE 0.98 1.02  CALCIUM 8.6 8.8   PT/INR  Recent Labs  01/08/14 1311  LABPROT 11.9  INR 0.89   Comprehensive Metabolic Panel:    Component Value Date/Time   NA 139 01/11/2014 0320   NA 142 01/10/2014 0353   K 3.4* 01/11/2014 0320   K 3.8 01/10/2014 0353   CL 102 01/11/2014 0320   CL 103 01/10/2014 0353   CO2 26 01/11/2014 0320   CO2 28 01/10/2014 0353   BUN 9 01/11/2014 0320   BUN 10 01/10/2014 0353   CREATININE 1.02 01/11/2014 0320   CREATININE 0.98 01/10/2014 0353   GLUCOSE 174* 01/11/2014 0320   GLUCOSE 97 01/10/2014 0353   CALCIUM 8.8 01/11/2014 0320   CALCIUM 8.6 01/10/2014 0353   AST 15 01/08/2014 1311   AST 17 01/01/2014 1100   ALT 12 01/08/2014 1311   ALT 12 01/01/2014 1100   ALKPHOS 55 01/08/2014 1311   ALKPHOS 58 01/01/2014 1100   BILITOT 0.3 01/08/2014 1311   BILITOT 0.2* 01/01/2014 1100   PROT 6.3 01/08/2014 1311   PROT 6.9 01/01/2014 1100   ALBUMIN 3.4* 01/08/2014 1311   ALBUMIN 3.6 01/01/2014 1100     Studies/Results: Ct Abdomen Pelvis W Contrast  01/10/2014   CLINICAL DATA:  New discovered cecal mass, carcinoma suspected.  EXAM: CT ABDOMEN AND PELVIS  WITH CONTRAST  TECHNIQUE: Multidetector CT imaging of the abdomen and pelvis was performed using the standard protocol following bolus administration of intravenous contrast.  CONTRAST:  29mL OMNIPAQUE IOHEXOL 300 MG/ML  SOLN  COMPARISON:  None.  FINDINGS: There is a soft tissue mass along the anterior wall of the cecum measuring 2.2 cm x 1.8 cm, which may reflect a polyp. No other convincing colonic masses are seen. There is no wall thickening. The pericolonic fat is unremarkable. There is no mesenteric adenopathy.  Liver is normal in size and attenuation.  No liver mass is seen.  There are no lung base nodules or masses. There are changes of emphysema and mild interstitial thickening.  Spleen, gallbladder, pancreas, adrenal glands:  Normal.  There is bilateral renal cortical thinning. There is a 2.2 cm right renal cyst. No other renal masses. No hydronephrosis. Normal ureters. Normal bladder.  Uterus and adnexa are unremarkable.  Atherosclerotic calcifications are noted along the abdominal aorta and iliac arteries. No aneurysm.  Remainder of the colon and small bowel are unremarkable. A normal appendix is visualized.  No osteoblastic or osteolytic lesions.  IMPRESSION: 1. 2.2 cm mass in the cecum is consistent with a  large polyp. There is no evidence of extension beyond the cecum. Specifically, no evidence of locally invasive colon carcinoma. 2. No evidence of metastatic disease within the abdomen or pelvis. 3. No acute findings.   Electronically Signed   By: Lajean Manes M.D.   On: 01/10/2014 17:05    Anti-infectives: Anti-infectives   Start     Dose/Rate Route Frequency Ordered Stop   01/09/14 1600  fluconazole (DIFLUCAN) 40 MG/ML suspension 100 mg     100 mg Oral Daily 01/09/14 1333        Assessment Principal Problems:   Colon cancer   Benign neoplasm of colon   Anemia Active Problems:   Dysphagia   COPD (chronic obstructive pulmonary disease)   GERD (gastroesophageal reflux disease)    Hyperlipidemia   Type II or unspecified type diabetes mellitus without mention of complication, uncontrolled   Essential hypertension, benign   Stricture and stenosis of esophagus   Candida esophagitis        LOS: 3 days   Plan: Tentatively planning on partial colectomy and transanal excision of rectal polyp tomorrow mid morning (3 hours under General Anesthesia)  pending Pulmonary evaluation.  If she needs to be optimized more from a pulmonary standpoint, could do that over weekend and schedule surgery for early next week.   Zianne Schubring J 01/11/2014

## 2014-01-11 NOTE — Consult Note (Signed)
Name: Amanda [name] MRN: [id number] DOB: 1941-07-09    ADMISSION DATE:  01/08/2014 CONSULTATION DATE:  01/11/2014  REFERRING MD :  Dr. Rockne Menghini  CHIEF COMPLAINT:  Pre-op respiratory evaluation  BRIEF PATIENT DESCRIPTION:  73 yo female former smoker admitted with dyspnea, fatigue, and anemia.  She was found to have cecal mass, and is scheduled for resection.  She is followed by Dr. Lake Bells for COPD (never had PFT's).  Pulmonary consult requested to assess pre-operative respiratory status.  SIGNIFICANT EVENTS: 6/08 Admit 6/10 [facility] consulted  STUDIES:  6/01 CT chest >> atherosclerosis, mild apical scarring, moderate/severe emphysema 6/10 Colonoscopy >> cecal mass 6/10 CT abdomen/pelvis >> 2.2 cm mass in cecum  HISTORY OF PRESENT ILLNESS:   73 yo female former smoker with hx of COPD/emphysema was recently admitted for dyspnea with concern for AECOPD.  She was found to have anemia.  She was evaluated by GI and had colonoscopy.  This showed cecal mass, and she was evaluated by surgery.  They are recommending resection.  Pulmonary consulted to assess her pulmonary status prior to surgery.  She was last on prednisone and antibiotics in May 2015.  She is not been getting sputum or chest congestion recently.  She is not having wheeze.  Her main problem is dyspnea with exertion.  She can only walk about 150 feet.  She has not needed home oxygen.  She denies hemoptysis, chest pain, or leg swelling.  PAST MEDICAL HISTORY :  Past Medical History  Diagnosis Date  . Emphysema lung   . Hyperlipidemia   . GERD (gastroesophageal reflux disease)   . Vitamin D deficiency   . Closed fracture of unspecified part of upper end of humerus   . Complete rupture of rotator cuff   . Other vitamin B12 deficiency anemia   . Neurogenic bladder, NOS   . COPD (chronic obstructive pulmonary disease)   . Type II or unspecified type diabetes mellitus without mention of complication, uncontrolled     diet controlled  .  Essential hypertension, benign     off lisinporil for last 2 months   Past Surgical History  Procedure Laterality Date  . Orif shoulder fracture Right 2011    arthroplasty  . Tonsillectomy and adenoidectomy  age 76 or 40  . Eye surgery Bilateral 2010    both eyes lens replacments  . Esophagogastroduodenoscopy (egd) with propofol N/A 01/09/2014    Procedure: ESOPHAGOGASTRODUODENOSCOPY (EGD) WITH PROPOFOL;  Surgeon: [name], MD;  Location: [facility];  Service: Endoscopy;  Laterality: N/A;  . Colonoscopy N/A 01/10/2014    Procedure: COLONOSCOPY;  Surgeon: [name], MD;  Location: [facility];  Service: Endoscopy;  Laterality: N/A;   Prior to Admission medications   Medication Sig Start Date End Date Taking? Authorizing Provider  albuterol (PROAIR HFA) 108 (90 BASE) MCG/ACT inhaler Inhale 2 puffs into the lungs 4 (four) times daily.   Yes Historical Provider, MD  albuterol (PROVENTIL) (2.5 MG/3ML) 0.083% nebulizer solution Take 2.5 mg by nebulization 4 (four) times daily.   Yes Historical Provider, MD  ferrous sulfate 325 (65 FE) MG tablet Take 1 tablet (325 mg total) by mouth daily. 01/01/14  Yes Wandra Arthurs, MD  mometasone-formoterol (DULERA) 100-5 MCG/ACT AERO Inhale 1 puff into the lungs 2 (two) times daily.   Yes Historical Provider, MD  omeprazole (PRILOSEC) 40 MG capsule Take 40 mg by mouth every morning.    Yes Historical Provider, MD  predniSONE (DELTASONE) 10 MG tablet Take 60mg  daily  for 3 days, then take 40mg  daily for three days, then take 20mg  daily for three days, then take 10mg  daily for three days 12/28/13   Juanito Doom, MD   No Known Allergies  FAMILY HISTORY:  Family History  Problem Relation Age of Onset  . Diabetes Father   . Bone cancer Father     bone marrow  . Emphysema Mother   . Pneumonia Mother    SOCIAL HISTORY:  reports that she quit smoking about 8 years ago. Her smoking use included Cigarettes. She has a 20 pack-year smoking history. She  has never used smokeless tobacco. She reports that she does not drink alcohol or use illicit drugs.  REVIEW OF SYSTEMS:   Negative except above.  SUBJECTIVE:   VITAL SIGNS: Temp:  [97.7 F (36.5 C)-98.5 F (36.9 C)] 98.2 F (36.8 C) (06/11 0518) Pulse Rate:  [28-124] 77 (06/11 0518) Resp:  [16-30] 20 (06/11 0518) BP: (127-183)/(26-147) 144/66 mmHg (06/11 0518) SpO2:  [92 %-100 %] 96 % (06/11 0834)  PHYSICAL EXAMINATION: General:  Thin, speaks in full sentences, not using accessory muscles Neuro:  Alert, normal strength HEENT:  Pupils reactive, no sinus tenderness, no oral exudate Cardiovascular:  Regular, no murmur Lungs:  Decreased breath sounds, prolonged exhalation, no wheeze/rales Abdomen:  Soft, non tender, + bowel sounds Musculoskeletal:  No edema Skin:  No rashes  CBC Recent Labs     01/09/14  0315   01/09/14  1610  01/10/14  0353  01/11/14  0320  WBC  10.8*   --    --   13.2*  12.7*  HGB  9.8*   < >  10.5*  9.9*  9.5*  HCT  29.5*   < >  32.3*  30.8*  29.7*  PLT  322   --    --   329  312   < > = values in this interval not displayed.    Coag's Recent Labs     01/08/14  1311  APTT  24  INR  0.89    BMET Recent Labs     01/09/14  0315  01/10/14  0353  01/11/14  0320  NA  138  142  139  K  3.8  3.8  3.4*  CL  101  103  102  CO2  25  28  26   BUN  12  10  9   CREATININE  0.94  0.98  1.02  GLUCOSE  92  97  174*    Electrolytes Recent Labs     01/09/14  0315  01/10/14  0353  01/11/14  0320  CALCIUM  8.6  8.6  8.8   Liver Enzymes Recent Labs     01/08/14  1311  AST  15  ALT  12  ALKPHOS  55  BILITOT  0.3  ALBUMIN  3.4*   Glucose Recent Labs     01/10/14  0404  01/10/14  0807  01/10/14  1229  01/10/14  1653  01/10/14  2109  01/11/14  0814  GLUCAP  94  159*  101*  141*  116*  144*    Imaging Ct Abdomen Pelvis W Contrast  01/10/2014   CLINICAL DATA:  New discovered cecal mass, carcinoma suspected.  EXAM: CT ABDOMEN AND PELVIS  WITH CONTRAST  TECHNIQUE: Multidetector CT imaging of the abdomen and pelvis was performed using the standard protocol following bolus administration of intravenous contrast.  CONTRAST:  68mL OMNIPAQUE IOHEXOL 300 MG/ML  SOLN  COMPARISON:  None.  FINDINGS: There is a soft tissue mass along the anterior wall of the cecum measuring 2.2 cm x 1.8 cm, which may reflect a polyp. No other convincing colonic masses are seen. There is no wall thickening. The pericolonic fat is unremarkable. There is no mesenteric adenopathy.  Liver is normal in size and attenuation.  No liver mass is seen.  There are no lung base nodules or masses. There are changes of emphysema and mild interstitial thickening.  Spleen, gallbladder, pancreas, adrenal glands:  Normal.  There is bilateral renal cortical thinning. There is a 2.2 cm right renal cyst. No other renal masses. No hydronephrosis. Normal ureters. Normal bladder.  Uterus and adnexa are unremarkable.  Atherosclerotic calcifications are noted along the abdominal aorta and iliac arteries. No aneurysm.  Remainder of the colon and small bowel are unremarkable. A normal appendix is visualized.  No osteoblastic or osteolytic lesions.  IMPRESSION: 1. 2.2 cm mass in the cecum is consistent with a large polyp. There is no evidence of extension beyond the cecum. Specifically, no evidence of locally invasive colon carcinoma. 2. No evidence of metastatic disease within the abdomen or pelvis. 3. No acute findings.   Electronically Signed   By: Lajean Manes M.D.   On: 01/10/2014 17:05    ASSESSMENT:  73 yo female with severe COPD/emphysema based on CT chest findings.  She has not had recent PFT's.  She has cecal mass and needs resection of this.    I explained to her that she is at high risk for per-operative pulmonary complications.  Having said this, she can proceed with surgery.  I explained her main risks are related to prolonged need for ventilatory support after surgery, and risk of  pneumonia.  Also explained that she may need to be monitored in ICU after surgery.  PLAN:  -Continue dulera -PRN duoneb -No indication for stress dose steroids pre-op >> if she has difficulty with hypotension, then may consider trial of stress dose steroids -continue bronchial hygiene regimen pre/post-op -mobilize as able post-op -monitor oxygen closely with goal of SpO2 > 92% -no indication for pre-operative ABG or PFT  PCCM will f/u after she has surgery.  Chesley Mires, MD [facility] Pulmonary/Critical Care 01/11/2014, 10:48 AM Pager:  [phone] After 3pm call: [phone]

## 2014-01-11 NOTE — Progress Notes (Signed)
Pulmonary input noted.  She is aware of the increase risk.  Plan on proceeding with operation tomorrow.

## 2014-01-11 NOTE — Progress Notes (Signed)
Progress Note   Subjective  feels okay. She can tolerate liquids.    Objective   Vital signs in last 24 hours: Temp:  [97.7 F (36.5 C)-98.5 F (36.9 C)] 98.2 F (36.8 C) (06/11 0518) Pulse Rate:  [70-124] 77 (06/11 0518) Resp:  [17-30] 20 (06/11 0518) BP: (127-183)/(35-83) 144/66 mmHg (06/11 0518) SpO2:  [92 %-100 %] 96 % (06/11 0834) Last BM Date: 01/10/14 General:    white female in NAD Abdomen:  Soft, nontender and nondistended. Normal bowel sounds. Extremities:  Without edema. Neurologic:  Alert and oriented,  grossly normal neurologically. Psych:  Cooperative. Normal mood and affect.  Lab Results:  Recent Labs  01/09/14 0315  01/09/14 1610 01/10/14 0353 01/11/14 0320  WBC 10.8*  --   --  13.2* 12.7*  HGB 9.8*  < > 10.5* 9.9* 9.5*  HCT 29.5*  < > 32.3* 30.8* 29.7*  PLT 322  --   --  329 312  < > = values in this interval not displayed. BMET  Recent Labs  01/09/14 0315 01/10/14 0353 01/11/14 0320  NA 138 142 139  K 3.8 3.8 3.4*  CL 101 103 102  CO2 25 28 26   GLUCOSE 92 97 174*  BUN 12 10 9   CREATININE 0.94 0.98 1.02  CALCIUM 8.6 8.6 8.8   LFT  Recent Labs  01/08/14 1311  PROT 6.3  ALBUMIN 3.4*  AST 15  ALT 12  ALKPHOS 55  BILITOT 0.3  BILIDIR <0.2  IBILI NOT CALCULATED   PT/INR  Recent Labs  01/08/14 1311  LABPROT 11.9  INR 0.89    Studies/Results: Ct Abdomen Pelvis W Contrast  01/10/2014   CLINICAL DATA:  New discovered cecal mass, carcinoma suspected.  EXAM: CT ABDOMEN AND PELVIS WITH CONTRAST  TECHNIQUE: Multidetector CT imaging of the abdomen and pelvis was performed using the standard protocol following bolus administration of intravenous contrast.  CONTRAST:  33mL OMNIPAQUE IOHEXOL 300 MG/ML  SOLN  COMPARISON:  None.  FINDINGS: There is a soft tissue mass along the anterior wall of the cecum measuring 2.2 cm x 1.8 cm, which may reflect a polyp. No other convincing colonic masses are seen. There is no wall thickening. The  pericolonic fat is unremarkable. There is no mesenteric adenopathy.  Liver is normal in size and attenuation.  No liver mass is seen.  There are no lung base nodules or masses. There are changes of emphysema and mild interstitial thickening.  Spleen, gallbladder, pancreas, adrenal glands:  Normal.  There is bilateral renal cortical thinning. There is a 2.2 cm right renal cyst. No other renal masses. No hydronephrosis. Normal ureters. Normal bladder.  Uterus and adnexa are unremarkable.  Atherosclerotic calcifications are noted along the abdominal aorta and iliac arteries. No aneurysm.  Remainder of the colon and small bowel are unremarkable. A normal appendix is visualized.  No osteoblastic or osteolytic lesions.  IMPRESSION: 1. 2.2 cm mass in the cecum is consistent with a large polyp. There is no evidence of extension beyond the cecum. Specifically, no evidence of locally invasive colon carcinoma. 2. No evidence of metastatic disease within the abdomen or pelvis. 3. No acute findings.   Electronically Signed   By: Lajean Manes M.D.   On: 01/10/2014 17:05     Assessment / Plan:   1. Cecal mass. CTscan of abd/pelvis shows the cecal mass, negative for metastatic disease. I don't see that CT of chest was done so will order. Surgery following and planning on  partial colectomy and transanal excision of rectal polyp following pulmonary clearance ( I double checked with Pulmonary and they will be seeing today)  2. Hepatic flexure polyp and rectal polyp, both marked but not removed.  3. Esophageal stricture, s/p dilation. Stricture still tight post dilation. Will likely need repeat EGD with dilation (outpatient). Tolerating liquids  4. Candida esophagitis, getting Diflucan  5. 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  I agree with  the extender's note, impression and recommendations.  Sandy Salaam. Deatra Ina, MD, [facility] [phone]

## 2014-01-11 NOTE — Progress Notes (Signed)
Progress Note   Amanda [name] NWG:[id number] DOB: 08/08/1940 DOA: 01/08/2014 PCP: Tamsen Roers, MD   Brief Narrative:   Amanda [name] is an 73 y.o. female with a PMH of COPD emphysema, hypertension, type 2 diabetes mellitus, GERD, hyperlipidemia who was admitted 01/08/14 after presenting to the ER with complaints of fatigue, generalized weakness, and a four-week history of increasing shortness of breath. Patient was seen in the ED a week ago for dyspnea and had a negative CT angiogram of the chest for PE. She was treated for COPD exacerbation with steroids. At that time, she was found to have a hemoglobin of 7.5, however the fecal occult test was negative. She was then started on iron supplementation and was DC'd home. She saw her PCP 01/08/14 and was sent to the ER for evaluation of anemia with a hemoglobin of 6.5. Patient has noticed black stools, attributed to iron supplementation however a stool occult test is positive in the ED. Patient denies any excessive NSAID use. She has noticed difficulty swallowing in the last 1 month. She has lost about 10 lbs from not eating enough due to dysphagia. Patient was seen by Dr. Carlean Purl on 12/28/13 and recommended EGD, per patient scheduled on on 01/10/14. Patient also reports that she never had any endoscopy or colonoscopy ever.  Assessment/Plan:   Principal Problem: Acute on chronic anemia secondary to GI bleeding in the setting of newly diagnosed colon cancer  The patient was admitted to the SDU and given 2 units of PRBCs. Post transfusion hemoglobin was 9.5 mg/dL.  Stable over past 24 hours.  Continue serial hemoglobin checks, transfuse as needed to keep hemoglobin greater than 8 mg/dL.  S/P colonoscopy 01/10/14, found to have a 4 cm cecal mass and multiple polyps.  Surgery consulted by GI.  Staging CT of abdomen and pelvis done 01/10/14, no evidence of mets.  Had CT chest done already on 01/01/14, so would not repeat.  Active Problems: Malignant  hypertension  Marked elevation of BP noted with high reading of 214/143.  Continue PRN hydralazine, add daily Norvasc.  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  Continue Diflucan.  F/U EGD for repeat dilation and examination of the upper GI tract distal to esophagus recommended.  COPD (chronic obstructive pulmonary disease)  Stable after completing prednisone taper.  Bronchodilator therapy with Xopenex and Dulera ordered.  Supplemental oxygen as needed.  Spoke with Dr. Halford Chessman who will provide pre-op guidance.  GERD (gastroesophageal reflux disease)  Continue PPI therapy.  Hyperlipidemia  Continue statin therapy.    Type II or unspecified type diabetes mellitus without mention of complication, uncontrolled  Continue insulin sensitive sliding scale. CBGs 83-210.    Essential hypertension, benign  Continue hydralazine when necessary.  DVT Prophylaxis  Continue SCDs.  Code Status: Full. Family Communication: Daughter at the bedside. Disposition Plan: Home when stable.   IV Access:    Peripheral IV   Procedures:    EGD 01/09/14: Tight peptic stricture in the distal esophagus, s/p balloon dilation to 10 mm; Candida esophagitis.  Colonoscopy 01/10/14: 1. Bleeding malignant tumor/mass at the ileocecal valve; multiple biopsies were performed 2. Sessile polyp measuring 2-3 cm in size was found at the hepatic flexure - not removed 3. Sessile polyp measuring 6 mm in size was found at the hepatic flexure - not removed 4. Two sessile polyps measuring 3 and 4 mm in size  were found in the transverse colon; polypectomy was performed 5. Sessile polyp measuring 3 mm in size was found in the descending colon; polypectomy was performed 6. Sessile polyp measuring 2 cm in size was found in the rectum; multiple biopsies were performed - not removed  7. There was severe diverticulosis noted in the sigmoid colon  Medical Consultants:    Dr. Erskine Emery, Gastroenterology.  Dr. Jackolyn Confer, Surgery.  Dr. Chesley Mires, PCCM.   Other Consultants:    None.   Anti-Infectives:    None.  Subjective:   [name] is more awake and alert today.  No current complaints of dyspnea. Hungry. On clear liquids.  Objective:    Filed Vitals:   01/10/14 1509 01/10/14 1918 01/10/14 2102 01/11/14 0518  BP:   136/62 144/66  Pulse:   91 77  Temp:   97.7 F (36.5 C) 98.2 F (36.8 C)  TempSrc:   Oral Oral  Resp:   18 20  Height:      Weight:      SpO2: 98% 95% 97% 97%    Intake/Output Summary (Last 24 hours) at 01/11/14 0817 Last data filed at 01/11/14 0600  Gross per 24 hour  Intake   2325 ml  Output   1300 ml  Net   1025 ml    Exam: Gen:  NAD Cardiovascular:  RRR, No M/R/G Respiratory:  Lungs CTAB  Gastrointestinal:  Abdomen soft, NT/ND, + BS Extremities:  No C/E/C   Data Reviewed:    Labs: Basic Metabolic Panel:  Recent Labs Lab 01/08/14 1311 01/09/14 0315 01/10/14 0353 01/11/14 0320  NA 136* 138 142 139  K 3.9 3.8 3.8 3.4*  CL 97 101 103 102  CO2 26 25 28 26   GLUCOSE 244* 92 97 174*  BUN 12 12 10 9   CREATININE 0.90 0.94 0.98 1.02  CALCIUM 8.8 8.6 8.6 8.8   GFR Estimated Creatinine Clearance: 36 ml/min (by C-G formula based on Cr of 1.02). Liver Function Tests:  Recent Labs Lab 01/08/14 1311  AST 15  ALT 12  ALKPHOS 55  BILITOT 0.3  PROT 6.3  ALBUMIN 3.4*   Coagulation profile  Recent Labs Lab 01/08/14 1311  INR 0.89    CBC:  Recent Labs Lab 01/08/14 1311  01/09/14 0315 01/09/14 0759 01/09/14 1610 01/10/14 0353 01/11/14 0320  WBC 14.4*  --  10.8*  --   --  13.2* 12.7*  NEUTROABS 13.7*  --   --   --   --   --   --   HGB 6.5*  < > 9.8* 9.5* 10.5* 9.9* 9.5*  HCT 21.1*  < > 29.5* 28.6* 32.3* 30.8* 29.7*  MCV 72.3*  --  77.6*  --   --  78.4 78.2  PLT 434*  --  322  --    --  329 312  < > = values in this interval not displayed. BNP (last 3 results)  Recent Labs  01/01/14 1100  PROBNP 1711.0*   CBG:  Recent Labs Lab 01/10/14 0807 01/10/14 1229 01/10/14 1653 01/10/14 2109 01/11/14 0814  GLUCAP 159* 101* 141* 116* 144*   Anemia work up:  Recent Labs  01/08/14 1311 01/08/14 1408  VITAMINB12  --  669  FOLATE  --  >20.0  FERRITIN  --  7*  TIBC  --  370  IRON  --  57  RETICCTPCT 3.4*  --    Sepsis Labs:  Recent Labs Lab 01/08/14 1311 01/09/14 0315 01/10/14 0353  01/11/14 0320  WBC 14.4* 10.8* 13.2* 12.7*   Microbiology Recent Results (from the past 240 hour(s))  MRSA PCR SCREENING     Status: None   Collection Time    01/08/14  4:26 PM      Result Value Ref Range Status   MRSA by PCR NEGATIVE  NEGATIVE Final   Comment:            The GeneXpert MRSA Assay (FDA     approved for NASAL specimens     only), is one component of a     comprehensive MRSA colonization     surveillance program. It is not     intended to diagnose MRSA     infection nor to guide or     monitor treatment for     MRSA infections.  URINE CULTURE     Status: None   Collection Time    01/08/14  5:28 PM      Result Value Ref Range Status   Specimen Description URINE, CLEAN CATCH   Final   Special Requests NONE   Final   Culture  Setup Time     Final   Value: 01/08/2014 22:44     Performed at [facility]     Final   Value: NO GROWTH     Performed at [facility]   Culture     Final   Value: NO GROWTH     Performed at [facility]   Report Status 01/09/2014 FINAL   Final     Radiographs/Studies:   Dg Chest Portable 1 View  01/08/2014   CLINICAL DATA:  Weakness.  Cough.  EXAM: PORTABLE CHEST - 1 VIEW  COMPARISON:  01/01/2014  FINDINGS: Pulmonary hyperinflation again seen, consistent with COPD. No evidence of pulmonary infiltrate or edema. No evidence of pleural effusion. Heart size is within normal limits.  No mass or lymphadenopathy identified. Right shoulder prosthesis again noted.  IMPRESSION: COPD.  No active disease.   Electronically Signed   By: Earle Gell M.D.   On: 01/08/2014 14:59    Medications:   . amLODipine  5 mg Oral Daily  . feeding supplement (RESOURCE BREEZE)  1 Container Oral TID BM  . fluconazole  100 mg Oral Daily  . insulin aspart  0-5 Units Subcutaneous QHS  . insulin aspart  0-9 Units Subcutaneous TID WC  . levalbuterol  0.63 mg Nebulization QID  . mometasone-formoterol  1 puff Inhalation BID  . sodium chloride  3 mL Intravenous Q12H   Continuous Infusions: . sodium chloride 20 mL/hr at 01/09/14 1834  . sodium chloride    . pantoprozole (PROTONIX) infusion 8 mg/hr (01/11/14 0458)    Time spent: 25 minutes.    LOS: 3 days   [facility] Pager [phone]. If unable to reach me by pager, please call my cell phone at [phone].  *Please refer to [email or web address], password [email or web address] to get updated schedule on who will round on this patient, as hospitalists switch teams weekly. If 7PM-7AM, please contact night-coverage at [email or web address], password [email or web address] for any overnight needs.  01/11/2014, 8:17 AM    **Disclaimer: This note was dictated with voice recognition software. Similar sounding words can inadvertently be transcribed and this note may contain transcription errors which may not have been corrected upon publication of note.**

## 2014-01-12 ENCOUNTER — Inpatient Hospital Stay (HOSPITAL_COMMUNITY): Payer: Medicare HMO | Admitting: Anesthesiology

## 2014-01-12 ENCOUNTER — Encounter (HOSPITAL_COMMUNITY)
Admission: EM | Disposition: A | Discharge status: Home Health | Payer: Self-pay | Source: Home / Self Care | Attending: Internal Medicine

## 2014-01-12 ENCOUNTER — Encounter (HOSPITAL_COMMUNITY): Payer: Medicare HMO | Admitting: Anesthesiology

## 2014-01-12 ENCOUNTER — Encounter (HOSPITAL_COMMUNITY): Payer: Self-pay | Admitting: Anesthesiology

## 2014-01-12 DIAGNOSIS — D375 Neoplasm of uncertain behavior of rectum: Secondary | ICD-10-CM

## 2014-01-12 DIAGNOSIS — C189 Malignant neoplasm of colon, unspecified: Secondary | ICD-10-CM

## 2014-01-12 DIAGNOSIS — C180 Malignant neoplasm of cecum: Secondary | ICD-10-CM | POA: Diagnosis not present

## 2014-01-12 DIAGNOSIS — B3781 Candidal esophagitis: Secondary | ICD-10-CM | POA: Diagnosis not present

## 2014-01-12 DIAGNOSIS — I519 Heart disease, unspecified: Secondary | ICD-10-CM | POA: Diagnosis not present

## 2014-01-12 DIAGNOSIS — D378 Neoplasm of uncertain behavior of other specified digestive organs: Secondary | ICD-10-CM

## 2014-01-12 DIAGNOSIS — D371 Neoplasm of uncertain behavior of stomach: Secondary | ICD-10-CM

## 2014-01-12 DIAGNOSIS — I10 Essential (primary) hypertension: Secondary | ICD-10-CM

## 2014-01-12 DIAGNOSIS — J9610 Chronic respiratory failure, unspecified whether with hypoxia or hypercapnia: Secondary | ICD-10-CM | POA: Diagnosis not present

## 2014-01-12 HISTORY — PX: LAPAROSCOPIC PARTIAL COLECTOMY: SHX5907

## 2014-01-12 LAB — SURGICAL PCR SCREEN
MRSA, PCR: NEGATIVE
STAPHYLOCOCCUS AUREUS: NEGATIVE

## 2014-01-12 LAB — CBC
HCT: 29.1 % — ABNORMAL LOW (ref 36.0–46.0)
Hemoglobin: 9.2 g/dL — ABNORMAL LOW (ref 12.0–15.0)
MCH: 25.4 pg — ABNORMAL LOW (ref 26.0–34.0)
MCHC: 31.6 g/dL (ref 30.0–36.0)
MCV: 80.4 fL (ref 78.0–100.0)
Platelets: 290 10*3/uL (ref 150–400)
RBC: 3.62 MIL/uL — AB (ref 3.87–5.11)
RDW: 21.1 % — AB (ref 11.5–15.5)
WBC: 8.6 10*3/uL (ref 4.0–10.5)

## 2014-01-12 LAB — GLUCOSE, CAPILLARY
GLUCOSE-CAPILLARY: 180 mg/dL — AB (ref 70–99)
GLUCOSE-CAPILLARY: 223 mg/dL — AB (ref 70–99)
Glucose-Capillary: 122 mg/dL — ABNORMAL HIGH (ref 70–99)
Glucose-Capillary: 220 mg/dL — ABNORMAL HIGH (ref 70–99)
Glucose-Capillary: 200 mg/dL — ABNORMAL HIGH (ref 70–99)

## 2014-01-12 SURGERY — LAPAROSCOPIC PARTIAL COLECTOMY
Anesthesia: General | Site: Abdomen

## 2014-01-12 MED ORDER — BUDESONIDE 0.25 MG/2ML IN SUSP
0.2500 mg | Freq: Two times a day (BID) | RESPIRATORY_TRACT | Status: DC
  Administered 2014-01-12 – 2014-01-21 (×18): 0.25 mg via RESPIRATORY_TRACT
  Filled 2014-01-12 (×26): qty 2

## 2014-01-12 MED ORDER — DEXAMETHASONE SODIUM PHOSPHATE 10 MG/ML IJ SOLN
INTRAMUSCULAR | Status: DC | PRN
  Administered 2014-01-12: 5 mg via INTRAVENOUS

## 2014-01-12 MED ORDER — DEXTROSE 5 % IV SOLN
2.0000 g | INTRAVENOUS | Status: AC
  Administered 2014-01-12: 2 g via INTRAVENOUS
  Filled 2014-01-12: qty 2

## 2014-01-12 MED ORDER — CHLORHEXIDINE GLUCONATE 0.12 % MT SOLN
15.0000 mL | Freq: Two times a day (BID) | OROMUCOSAL | Status: DC
  Administered 2014-01-12 – 2014-01-17 (×10): 15 mL via OROMUCOSAL
  Filled 2014-01-12 (×15): qty 15

## 2014-01-12 MED ORDER — ONDANSETRON HCL 4 MG/2ML IJ SOLN
4.0000 mg | INTRAMUSCULAR | Status: DC | PRN
  Administered 2014-01-12: 4 mg via INTRAVENOUS
  Filled 2014-01-12: qty 2

## 2014-01-12 MED ORDER — CEFOTETAN DISODIUM-DEXTROSE 2-2.08 GM-% IV SOLR
INTRAVENOUS | Status: AC
  Filled 2014-01-12: qty 50

## 2014-01-12 MED ORDER — ROCURONIUM BROMIDE 100 MG/10ML IV SOLN
INTRAVENOUS | Status: DC | PRN
  Administered 2014-01-12: 50 mg via INTRAVENOUS

## 2014-01-12 MED ORDER — HYDROMORPHONE HCL PF 1 MG/ML IJ SOLN
0.2500 mg | INTRAMUSCULAR | Status: DC | PRN
  Administered 2014-01-12 (×2): 0.25 mg via INTRAVENOUS
  Administered 2014-01-12: 0.5 mg via INTRAVENOUS
  Administered 2014-01-12 (×2): 0.25 mg via INTRAVENOUS

## 2014-01-12 MED ORDER — ONDANSETRON HCL 4 MG/2ML IJ SOLN
INTRAMUSCULAR | Status: DC | PRN
  Administered 2014-01-12: 4 mg via INTRAVENOUS

## 2014-01-12 MED ORDER — FENTANYL CITRATE 0.05 MG/ML IJ SOLN
INTRAMUSCULAR | Status: AC
  Filled 2014-01-12: qty 5

## 2014-01-12 MED ORDER — BUPIVACAINE-EPINEPHRINE (PF) 0.5% -1:200000 IJ SOLN
INTRAMUSCULAR | Status: AC
  Filled 2014-01-12: qty 30

## 2014-01-12 MED ORDER — BUPIVACAINE-EPINEPHRINE 0.5% -1:200000 IJ SOLN
INTRAMUSCULAR | Status: DC | PRN
  Administered 2014-01-12: 3 mL

## 2014-01-12 MED ORDER — GLYCOPYRROLATE 0.2 MG/ML IJ SOLN
INTRAMUSCULAR | Status: AC
  Filled 2014-01-12: qty 3

## 2014-01-12 MED ORDER — FENTANYL CITRATE 0.05 MG/ML IJ SOLN
INTRAMUSCULAR | Status: DC | PRN
  Administered 2014-01-12: 50 ug via INTRAVENOUS
  Administered 2014-01-12 (×3): 100 ug via INTRAVENOUS

## 2014-01-12 MED ORDER — BUPIVACAINE HCL (PF) 0.5 % IJ SOLN
INTRAMUSCULAR | Status: AC
  Filled 2014-01-12: qty 30

## 2014-01-12 MED ORDER — ALVIMOPAN 12 MG PO CAPS
12.0000 mg | ORAL_CAPSULE | Freq: Two times a day (BID) | ORAL | Status: DC
  Administered 2014-01-13 – 2014-01-15 (×6): 12 mg via ORAL
  Filled 2014-01-12 (×9): qty 1

## 2014-01-12 MED ORDER — FENTANYL CITRATE 0.05 MG/ML IJ SOLN
12.5000 ug | INTRAMUSCULAR | Status: DC | PRN
  Administered 2014-01-12 – 2014-01-15 (×6): 25 ug via INTRAVENOUS
  Filled 2014-01-12 (×6): qty 2

## 2014-01-12 MED ORDER — GLYCOPYRROLATE 0.2 MG/ML IJ SOLN
INTRAMUSCULAR | Status: DC | PRN
  Administered 2014-01-12: 0.4 mg via INTRAVENOUS

## 2014-01-12 MED ORDER — BIOTENE DRY MOUTH MT LIQD
15.0000 mL | Freq: Two times a day (BID) | OROMUCOSAL | Status: DC
  Administered 2014-01-12 – 2014-01-17 (×9): 15 mL via OROMUCOSAL

## 2014-01-12 MED ORDER — ONDANSETRON HCL 4 MG/2ML IJ SOLN
INTRAMUSCULAR | Status: AC
  Filled 2014-01-12: qty 2

## 2014-01-12 MED ORDER — HEPARIN SODIUM (PORCINE) 5000 UNIT/ML IJ SOLN
5000.0000 [IU] | Freq: Three times a day (TID) | INTRAMUSCULAR | Status: DC
  Administered 2014-01-13 – 2014-01-21 (×24): 5000 [IU] via SUBCUTANEOUS
  Filled 2014-01-12 (×28): qty 1

## 2014-01-12 MED ORDER — HYDROMORPHONE HCL PF 1 MG/ML IJ SOLN
INTRAMUSCULAR | Status: AC
  Filled 2014-01-12: qty 1

## 2014-01-12 MED ORDER — LIDOCAINE HCL (PF) 2 % IJ SOLN
INTRAMUSCULAR | Status: DC | PRN
  Administered 2014-01-12: 50 mg via INTRADERMAL

## 2014-01-12 MED ORDER — KCL-LACTATED RINGERS-D5W 20 MEQ/L IV SOLN
INTRAVENOUS | Status: DC
  Administered 2014-01-12 – 2014-01-13 (×2): via INTRAVENOUS
  Filled 2014-01-12 (×9): qty 1000

## 2014-01-12 MED ORDER — DEXAMETHASONE SODIUM PHOSPHATE 10 MG/ML IJ SOLN
INTRAMUSCULAR | Status: AC
  Filled 2014-01-12: qty 1

## 2014-01-12 MED ORDER — LACTATED RINGERS IR SOLN
Status: DC | PRN
  Administered 2014-01-12: 1000 mL

## 2014-01-12 MED ORDER — PROPOFOL 10 MG/ML IV BOLUS
INTRAVENOUS | Status: DC | PRN
  Administered 2014-01-12: 50 mg via INTRAVENOUS
  Administered 2014-01-12: 100 mg via INTRAVENOUS

## 2014-01-12 MED ORDER — BUDESONIDE 0.25 MG/2ML IN SUSP: 0.2500 mg | Freq: Two times a day (BID) | RESPIRATORY_TRACT | Status: DC

## 2014-01-12 MED ORDER — ARFORMOTEROL TARTRATE 15 MCG/2ML IN NEBU
15.0000 ug | INHALATION_SOLUTION | Freq: Two times a day (BID) | RESPIRATORY_TRACT | Status: DC
  Filled 2014-01-12 (×2): qty 2

## 2014-01-12 MED ORDER — BUPIVACAINE HCL (PF) 0.5 % IJ SOLN
INTRAMUSCULAR | Status: DC | PRN
  Administered 2014-01-12: 10 mL

## 2014-01-12 MED ORDER — LIDOCAINE HCL (CARDIAC) 20 MG/ML IV SOLN
INTRAVENOUS | Status: AC
  Filled 2014-01-12: qty 5

## 2014-01-12 MED ORDER — PANTOPRAZOLE SODIUM 40 MG IV SOLR
40.0000 mg | Freq: Two times a day (BID) | INTRAVENOUS | Status: DC
  Administered 2014-01-12 – 2014-01-19 (×15): 40 mg via INTRAVENOUS
  Filled 2014-01-12 (×16): qty 40

## 2014-01-12 MED ORDER — SODIUM CHLORIDE 0.9 % IJ SOLN
INTRAMUSCULAR | Status: AC
  Filled 2014-01-12: qty 10

## 2014-01-12 MED ORDER — HEMOSTATIC AGENTS (NO CHARGE) OPTIME
TOPICAL | Status: DC | PRN
  Administered 2014-01-12: 1 via TOPICAL

## 2014-01-12 MED ORDER — ONDANSETRON HCL 4 MG PO TABS: 4.0000 mg | ORAL_TABLET | Freq: Four times a day (QID) | ORAL | Status: DC | PRN

## 2014-01-12 MED ORDER — LACTATED RINGERS IV SOLN
INTRAVENOUS | Status: DC
  Administered 2014-01-12: 1000 mL via INTRAVENOUS
  Administered 2014-01-12: 13:00:00 via INTRAVENOUS

## 2014-01-12 MED ORDER — LEVALBUTEROL HCL 0.63 MG/3ML IN NEBU
0.6300 mg | INHALATION_SOLUTION | RESPIRATORY_TRACT | Status: DC | PRN
  Administered 2014-01-13: 0.63 mg via RESPIRATORY_TRACT
  Filled 2014-01-12 (×2): qty 3

## 2014-01-12 MED ORDER — ATROPINE SULFATE 0.4 MG/ML IJ SOLN
INTRAMUSCULAR | Status: AC
  Filled 2014-01-12: qty 1

## 2014-01-12 MED ORDER — NEOSTIGMINE METHYLSULFATE 10 MG/10ML IV SOLN
INTRAVENOUS | Status: DC | PRN
  Administered 2014-01-12: 3.5 mg via INTRAVENOUS

## 2014-01-12 MED ORDER — EPHEDRINE SULFATE 50 MG/ML IJ SOLN
INTRAMUSCULAR | Status: AC
  Filled 2014-01-12: qty 1

## 2014-01-12 MED ORDER — ROCURONIUM BROMIDE 100 MG/10ML IV SOLN
INTRAVENOUS | Status: AC
  Filled 2014-01-12: qty 1

## 2014-01-12 MED ORDER — DEXTROSE 5 % IV SOLN
2.0000 g | Freq: Two times a day (BID) | INTRAVENOUS | Status: AC
  Administered 2014-01-12: 2 g via INTRAVENOUS
  Filled 2014-01-12: qty 2

## 2014-01-12 MED ORDER — ARFORMOTEROL TARTRATE 15 MCG/2ML IN NEBU
15.0000 ug | INHALATION_SOLUTION | Freq: Two times a day (BID) | RESPIRATORY_TRACT | Status: DC
  Administered 2014-01-12 – 2014-01-15 (×6): 15 ug via RESPIRATORY_TRACT
  Filled 2014-01-12 (×12): qty 2

## 2014-01-12 MED ORDER — FENTANYL CITRATE 0.05 MG/ML IJ SOLN
INTRAMUSCULAR | Status: AC
  Filled 2014-01-12: qty 2

## 2014-01-12 MED ORDER — PROPOFOL 10 MG/ML IV BOLUS
INTRAVENOUS | Status: AC
  Filled 2014-01-12: qty 20

## 2014-01-12 SURGICAL SUPPLY — 76 items
APPLIER CLIP 5 13 M/L LIGAMAX5 (MISCELLANEOUS)
APPLIER CLIP ROT 10 11.4 M/L (STAPLE)
BLADE EXTENDED COATED 6.5IN (ELECTRODE) ×3 IMPLANT
BLADE HEX COATED 2.75 (ELECTRODE) ×3 IMPLANT
BLADE SURG SZ10 CARB STEEL (BLADE) ×3 IMPLANT
CABLE HIGH FREQUENCY MONO STRZ (ELECTRODE) ×3 IMPLANT
CANISTER SUCTION 2500CC (MISCELLANEOUS) IMPLANT
CELLS DAT CNTRL 66122 CELL SVR (MISCELLANEOUS) IMPLANT
CLIP APPLIE 5 13 M/L LIGAMAX5 (MISCELLANEOUS) IMPLANT
CLIP APPLIE ROT 10 11.4 M/L (STAPLE) IMPLANT
COUNTER NEEDLE 20 DBL MAG RED (NEEDLE) ×3 IMPLANT
COVER MAYO STAND STRL (DRAPES) ×6 IMPLANT
DECANTER SPIKE VIAL GLASS SM (MISCELLANEOUS) ×3 IMPLANT
DISSECTOR BLUNT TIP ENDO 5MM (MISCELLANEOUS) IMPLANT
DRAIN CHANNEL 19F RND (DRAIN) IMPLANT
DRAPE LAPAROSCOPIC ABDOMINAL (DRAPES) ×3 IMPLANT
DRAPE LG THREE QUARTER DISP (DRAPES) ×6 IMPLANT
DRAPE TABLE BACK 44X90 PK DISP (DRAPES) ×3 IMPLANT
DRAPE UTILITY XL STRL (DRAPES) ×6 IMPLANT
DRAPE WARM FLUID 44X44 (DRAPE) ×3 IMPLANT
DRSG OPSITE POSTOP 4X10 (GAUZE/BANDAGES/DRESSINGS) IMPLANT
DRSG OPSITE POSTOP 4X6 (GAUZE/BANDAGES/DRESSINGS) ×3 IMPLANT
DRSG OPSITE POSTOP 4X8 (GAUZE/BANDAGES/DRESSINGS) IMPLANT
ELECT PENCIL ROCKER SW 15FT (MISCELLANEOUS) IMPLANT
ELECT REM PT RETURN 9FT ADLT (ELECTROSURGICAL) ×3
ELECTRODE REM PT RTRN 9FT ADLT (ELECTROSURGICAL) ×1 IMPLANT
EVACUATOR SILICONE 100CC (DRAIN) IMPLANT
FILTER SMOKE EVAC LAPAROSHD (FILTER) IMPLANT
GAUZE SPONGE 4X4 12PLY STRL (GAUZE/BANDAGES/DRESSINGS) ×3 IMPLANT
GLOVE ECLIPSE 8.0 STRL XLNG CF (GLOVE) ×12 IMPLANT
GLOVE INDICATOR 8.0 STRL GRN (GLOVE) ×6 IMPLANT
GOWN STRL REUS W/TWL XL LVL3 (GOWN DISPOSABLE) ×30 IMPLANT
KIT BASIN OR (CUSTOM PROCEDURE TRAY) ×9 IMPLANT
LEGGING LITHOTOMY PAIR STRL (DRAPES) ×3 IMPLANT
LIGASURE IMPACT 36 18CM CVD LR (INSTRUMENTS) ×3 IMPLANT
MANIFOLD NEPTUNE II (INSTRUMENTS) ×6 IMPLANT
PENCIL BUTTON HOLSTER BLD 10FT (ELECTRODE) ×6 IMPLANT
RELOAD PROXIMATE 75MM BLUE (ENDOMECHANICALS) ×12 IMPLANT
RTRCTR WOUND ALEXIS 18CM MED (MISCELLANEOUS)
SCISSORS LAP 5X35 DISP (ENDOMECHANICALS) ×3 IMPLANT
SET IRRIG TUBING LAPAROSCOPIC (IRRIGATION / IRRIGATOR) ×3 IMPLANT
SHEARS HARMONIC 9CM CVD (BLADE) ×3 IMPLANT
SHEARS HARMONIC ACE PLUS 36CM (ENDOMECHANICALS) IMPLANT
SLEEVE XCEL OPT CAN 5 100 (ENDOMECHANICALS) ×6 IMPLANT
SOLUTION ANTI FOG 6CC (MISCELLANEOUS) ×6 IMPLANT
SPONGE HEMORRHOID 8X3CM (HEMOSTASIS) ×3 IMPLANT
SPONGE LAP 18X18 X RAY DECT (DISPOSABLE) ×9 IMPLANT
STAPLER PROXIMATE 75MM BLUE (STAPLE) ×3 IMPLANT
STAPLER VISISTAT 35W (STAPLE) ×6 IMPLANT
SUCTION POOLE TIP (SUCTIONS) ×6 IMPLANT
SUT ETHILON 2 0 PS N (SUTURE) IMPLANT
SUT MNCRL AB 4-0 PS2 18 (SUTURE) ×3 IMPLANT
SUT PDS AB 1 CTX 36 (SUTURE) ×6 IMPLANT
SUT PDS AB 1 TP1 96 (SUTURE) IMPLANT
SUT PROLENE 2 0 KS (SUTURE) IMPLANT
SUT PROLENE 2 0 SH DA (SUTURE) IMPLANT
SUT SILK 2 0 (SUTURE) ×2
SUT SILK 2 0 SH CR/8 (SUTURE) ×3 IMPLANT
SUT SILK 2-0 18XBRD TIE 12 (SUTURE) ×1 IMPLANT
SUT SILK 3 0 (SUTURE) ×2
SUT SILK 3 0 SH CR/8 (SUTURE) ×6 IMPLANT
SUT SILK 3-0 18XBRD TIE 12 (SUTURE) ×1 IMPLANT
SUT VICRYL 2 0 18  UND BR (SUTURE) ×2
SUT VICRYL 2 0 18 UND BR (SUTURE) ×1 IMPLANT
SYR BULB IRRIGATION 50ML (SYRINGE) ×6 IMPLANT
SYS LAPSCP GELPORT 120MM (MISCELLANEOUS) ×3
SYSTEM LAPSCP GELPORT 120MM (MISCELLANEOUS) ×1 IMPLANT
TOWEL OR 17X26 10 PK STRL BLUE (TOWEL DISPOSABLE) ×9 IMPLANT
TOWEL OR NON WOVEN STRL DISP B (DISPOSABLE) ×6 IMPLANT
TRAY FOLEY CATH 14FRSI W/METER (CATHETERS) ×3 IMPLANT
TRAY LAP CHOLE (CUSTOM PROCEDURE TRAY) ×3 IMPLANT
TROCAR BLADELESS OPT 5 100 (ENDOMECHANICALS) ×3 IMPLANT
TROCAR XCEL BLUNT TIP 100MML (ENDOMECHANICALS) ×3 IMPLANT
TROCAR XCEL NON-BLD 11X100MML (ENDOMECHANICALS) ×3 IMPLANT
TUBING INSUFFLATION 10FT LAP (TUBING) ×3 IMPLANT
YANKAUER SUCT BULB TIP 10FT TU (MISCELLANEOUS) ×6 IMPLANT

## 2014-01-12 NOTE — Progress Notes (Signed)
[facility] Progress Note Patient Name: Amanda [name] DOB: Jun 29, 1941 MRN: [id number]  Date of Service  01/12/2014   HPI/Events of Note   POst op eval  eICU Interventions  No distress, vitals excellent On low O2   Intervention Category Major Interventions: Airway management  [name]. 01/12/2014, 5:02 PM

## 2014-01-12 NOTE — Progress Notes (Signed)
    Progress Note   Subjective  no complaints   Objective   Vital signs in last 24 hours: Temp:  [97.7 F (36.5 C)-98.2 F (36.8 C)] 98.2 F (36.8 C) (06/12 0500) Pulse Rate:  [74-103] 74 (06/12 0500) Resp:  [18-20] 20 (06/12 0500) BP: (107-162)/(52-73) 107/52 mmHg (06/12 0500) SpO2:  [92 %-98 %] 93 % (06/12 0736) Last BM Date: 01/11/14 General:    white female in NAD Abdomen:  Soft, nontender and nondistended. Normal bowel sounds. Extremities:  Without edema. Neurologic:  Alert and oriented,  grossly normal neurologically. Psych:  Cooperative. Normal mood and affect.    Lab Results:  Recent Labs  01/10/14 0353 01/11/14 0320 01/12/14 0327  WBC 13.2* 12.7* 8.6  HGB 9.9* 9.5* 9.2*  HCT 30.8* 29.7* 29.1*  PLT 329 312 290   BMET  Recent Labs  01/10/14 0353 01/11/14 0320  NA 142 139  K 3.8 3.4*  CL 103 102  CO2 28 26  GLUCOSE 97 174*  BUN 10 9  CREATININE 0.98 1.02  CALCIUM 8.6 8.8      Assessment / Plan:   Colon cancer (cecum). No metastatic disease on CTscan. For partial colectomy and transanal excision of rectal polyp later today  2. Hepatic flexure polyp and rectal polyp, both marked but not removed.   3. Esophageal stricture, s/p dilation. Stricture still tight post dilation. Will likely need repeat EGD with dilation (outpatient).   4. Candida esophagitis, getting Diflucan   5. 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  I agree with the extender's note, impression and recommendations.  Signing off.  Sandy Salaam. Deatra Ina, MD, [facility] [phone]

## 2014-01-12 NOTE — Anesthesia Preprocedure Evaluation (Addendum)
Anesthesia Evaluation  Patient identified by MRN, date of birth, ID band Patient awake  General Assessment Comment: Emphysema lung     .  Hyperlipidemia     .  GERD (gastroesophageal reflux disease)     .  Vitamin D deficiency     .  Closed fracture of unspecified part of upper end of humerus     .  Complete rupture of rotator cuff     .  Other vitamin B12 deficiency anemia     .  Neurogenic bladder, NOS     .  COPD (chronic obstructive pulmonary disease)     .  Type II or unspecified type diabetes mellitus without mention of complication, uncontrolled         diet controlled   .  Essential hypertension, benign         off lisinporil for last 2 months     Reviewed: Allergy & Precautions, H&P , NPO status , Patient's Chart, lab work & pertinent test results  Airway Mallampati: II TM Distance: >3 FB Neck ROM: Full    Dental no notable dental hx.    Pulmonary COPD COPD inhaler, former smoker,  breath sounds clear to auscultation  Pulmonary exam normal       Cardiovascular Exercise Tolerance: Good hypertension, Rhythm:Regular Rate:Normal     Neuro/Psych negative neurological ROS  negative psych ROS   GI/Hepatic Neg liver ROS, GERD-  Medicated,  Endo/Other  diabetes, Type 2  Renal/GU negative Renal ROS  negative genitourinary   Musculoskeletal negative musculoskeletal ROS (+)   Abdominal   Peds negative pediatric ROS (+)  Hematology  (+) anemia , Hgb 9.2   Anesthesia Other Findings   Reproductive/Obstetrics negative OB ROS                         Anesthesia Physical Anesthesia Plan  ASA: III  Anesthesia Plan: General   Post-op Pain Management:    Induction: Intravenous  Airway Management Planned: Oral ETT  Additional Equipment:   Intra-op Plan:   Post-operative Plan: Extubation in OR and Possible Post-op intubation/ventilation  Informed Consent: I have reviewed the patients  History and Physical, chart, labs and discussed the procedure including the risks, benefits and alternatives for the proposed anesthesia with the patient or authorized representative who has indicated his/her understanding and acceptance.   Dental advisory given  Plan Discussed with: CRNA  Anesthesia Plan Comments: (Discussed possible postoperative ventilation with patient due to significant COPD)       Anesthesia Quick Evaluation

## 2014-01-12 NOTE — Op Note (Signed)
Operative Note  Amanda [name] female 73 y.o. 01/12/2014  PREOPERATIVE DX:  1. Cecal cancer. 2. Hepatic flexure sessile polyp. 3. Rectal sessile polyp.  POSTOPERATIVE DX:  Same  PROCEDURE:  1. Laparoscopic-assisted right colectomy. 2. Transanal excision of rectal polyp.         Surgeon: Odis Hollingshead   Assistants: Saverio Danker PA  Anesthesia: General endotracheal anesthesia  Indications: This is a 72 year old female with weakness and shortness of breath. She is known to be profoundly anemic. She is admitted to the hospital. Colonoscopy demonstrated a lesion in the cecum that appeared to be bleeding. There is also a sessile polyp at the hepatic flexure and one at the rectum. 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 She now presents for the above procedures.    Procedure Detail:  She was brought to the operating room placed supine on the operating table and a general anesthetic was given. She was placed in the lithotomy position. A Foley catheter was inserted. An oral gastric tube was inserted. Digital rectal exam demonstrated a palpable polyp approximately 4 cm from the anal verge. The abdominal wall and perianal areas were then sterilely prepped and draped.  A 5 mm incision was made in the left upper quadrant. Using a 5 mm Optiview trocar and laparoscope access was gained into the peritoneal cavity. A pneumoperitoneum was created. Inspection of the area underneath the trocar demonstrated no evidence of bleeding or organ injury. A 5 mm trocar was then placed in the subumbilical area. 5 mm trocar was placed in the supraumbilical area.  The cecum was identified. I also noted a tattoo mark at the hepatic flexure. The cecum and right colon were then mobilized by dividing the lateral attachments using sharp and blunt dissection. I then mobilized the proximal half of transverse colon using sharp and blunt dissection. Assault was able to bring the right colon and  proximal portion of transverse colon directly under the umbilicus. The periumbilical trocar is removed and an small midline incision was made by connecting these 2 incisions. The wound protection device in place. I then exteriorized the terminal ileum right colon and proximal half of the transverse colon. Tumor was palpable in the cecum. It to mark was visualized. The transverse colon was divided with a linear cutting stapler proximal to the middle colic vessels. The ileum was divided with a linear cutting stapler approximately 10 cm proximal to the ileocecal valve.  Mesentery was divided in a wedge shaped using the LigaSure device.  The specimen was removed which included the terminal ileum, right colon, and proximal third of the transverse colon.  The specimen was opened up on the back table and the 2 lesions were identified.  A side-to-side anastomosis between the distal ileum and transverse colon was performed with the linear cutting stapler. The common defect was closed in 2 layers using a running locking 3-0 Vicryl full-thickness layer. Second layer was interrupted 3-0 silk sutures in a Lembert-type fashion. A crotch stitch of 3-0 silk was placed. The anastomosis was patent, viable, under no tension, and demonstrated no evidence of leak.  The abdominal cavity was copiously irrigated. There is no evidence of bleeding or organ injury. The fashion of a limited midline incision was closed with running #1 PDS suture. Repeat laparoscopy was performed. A 4 quadrant inspection demonstrated no evidence of bleeding or organ injury. The CO2 gas was released and the left upper quadrant trocar was removed.  Left upper quadrant skin incision was closed  with 4 Monocryl subcuticular stitch. Limited midline incision skin was closed with staples. Sterile dressings were applied. Needle sponge and instrument counts were correct before closure.  The anal rectal area was then approached. Using the anoscope identified the  sessile type polyp in the left lateral position. Marcaine with epinephrine was infiltrated in the submucosa. Using the harmonic scalpel the polyp was removed. Hemostasis was adequate. Inspection of the anal canal and distal rectum using the anoscope demonstrated no other obvious lesions. A piece of Gelfoam was placed in the anal canal. A bulky dressing was applied.  She tolerated the procedures well without any apparent complications and was taken to the recovery room in satisfactory condition.   Estimated Blood Loss:  200 mL         Drains: none  Blood Given: none          Specimens: Right colon. Rectal polyp.        Complications:  * No complications entered in OR log *         Disposition: PACU - hemodynamically stable.         Condition: stable

## 2014-01-12 NOTE — Progress Notes (Signed)
To OR later today.  Problem will end up in SDU/ICU postop.

## 2014-01-12 NOTE — Progress Notes (Addendum)
Patient ID: Amanda [name], female   DOB: Jul 31, 1941, 73 y.o.   MRN: [id number] [facility] PROGRESS NOTE  Amanda [name] NWG:[id number] DOB: Jun 19, 1941 DOA: 01/08/2014 PCP: Tamsen Roers, MD  Brief narrative: 73 y.o. female with a PMH of COPD emphysema, hypertension, type 2 diabetes mellitus, GERD, hyperlipidemia, seen in ED 1 week prior to this presentation for COPD and at which time CT angio chest was negative for PE. At that time she was found to have Hgb of 7.5 and was given oral iron supplementations and subsequently discharged home. Patient  was admitted 01/08/14 with complaints of fatigue, generalized weakness, and a four-week history of increasing shortness of breath. She was found to have hemoglobin of 6.5 and patient additionally reported black stools, unintentional 10 pound weight loss which pt attributed to ongoing dysphagia for past 1 month PTA. Hospital course was complicated due to finding of cecal mass on colonoscopy confirmed to be colon cancer. Patient has surgery planned today.  Assessment and Plan:  Principal Problem:  Acute on chronic anemia secondary to GI bleeding in the setting of newly diagnosed colon cancer  Pt initially admitted to SDU. She has received 2 units PRBC transfusion with post-transfusion Hgb 9.5. Hgb remains stable at 9.2-9.5 range. Plan is to transfuse for hemoglobin less than 8. Status post colonoscopy 01/10/2014 with findings of cecal mass and multiple polyps. She is going for surgery today. Pt also receiving protonix drip 5 mg/hr Appreciate surgery and GI following. Staging CT of abdomen and pelvis done 01/10/14, no evidence of mets. Had CT chest done already on 01/01/14, so would not repeat. Active Problems:  Malignant hypertension  Marked elevation of BP noted 214/143. She is on daily Norvasc 5 mg daily. This am BP 107/52.  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 F/U EGD for repeat dilation and examination of the upper GI tract distal to esophagusis  recommended. Continue protonix drip  Continue Diflucan 100 mg daily COPD (chronic obstructive pulmonary disease)  Stable after completing prednisone taper. Per Dr. Halford Chessman pt is medically optimized for surgery today with post-operative precaution that she may need prolonged intubation. Continue xopenx 4 times daily and dulera inhaled BID Supplemental oxygen as needed.  GERD (gastroesophageal reflux disease)  Continue Protonix drip 8 mg/hr infusion  Type II or unspecified type diabetes mellitus without mention of complication, uncontrolled  Continue insulin sensitive sliding scale. CBG's in past hours: 280, 196, 111   DVT prophylaxis: SCD's bilaterally; no AC due to planned surgery today   Code Status: full code  Family Communication: plan of care discussed with the patient and ger family at the bedside Disposition Plan: remains inpatient   [name], MD  [facility] Pager [phone]  If 7PM-7AM, please contact night-coverage [email or web address] Password [facility] 01/12/2014, 9:32 AM   LOS: 4 days   Consultants:  Surgery (Dr. Zella Richer)  GI (Dr. 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  Colonoscopy 01/10/14: 1. Bleeding malignant tumor/mass at the ileocecal valve; multiple biopsies were performed 2. Sessile polyp measuring 2-3 cm in size was found at the hepatic flexure - not removed 3. Sessile polyp measuring 6 mm in size was found at the hepatic flexure - not removed 4. Two sessile polyps  measuring 3 and 4 mm in size were found in the transverse colon; polypectomy was performed 5. Sessile polyp measuring 3 mm in size was found in the descending colon; polypectomy  was performed 6. Sessile polyp measuring 2 cm in size was found in the rectum; multiple biopsies were performed - not removed 7. There was severe diverticulosis noted in the sigmoid colon  Partial colectomy and transanal excision of rectal polyp planned for today 01/12/2014  Antibiotics:  Cefotetan   HPI/Subjective: No acute overnight events.  Objective: Filed Vitals:   01/11/14 1937 01/11/14 2157 01/12/14 0500 01/12/14 0736  BP:  142/73 107/52   Pulse:  88 74   Temp:  98.1 F (36.7 C) 98.2 F (36.8 C)   TempSrc:  Oral Oral   Resp:  20 20   Height:      Weight:      SpO2: 92% 98% 98% 93%    Intake/Output Summary (Last 24 hours) at 01/12/14 0932 Last data filed at 01/12/14 0700  Gross per 24 hour  Intake   1905 ml  Output   1050 ml  Net    855 ml    Exam:   General:  Pt is alert, follows commands appropriately, not in acute distress  Cardiovascular: Regular rate and rhythm, S1/S2, no murmurs  Respiratory: Clear to auscultation bilaterally, no wheezing, no crackles  Abdomen: Soft, non tender, non distended, bowel sounds present  Extremities: No edema, pulses DP and PT palpable bilaterally  Neuro: Grossly nonfocal  Data Reviewed: Basic Metabolic Panel:  Recent Labs Lab 01/08/14 1311 01/09/14 0315 01/10/14 0353 01/11/14 0320  NA 136* 138 142 139  K 3.9 3.8 3.8 3.4*  CL 97 101 103 102  CO2 26 25 28 26   GLUCOSE 244* 92 97 174*  BUN 12 12 10 9   CREATININE 0.90 0.94 0.98 1.02  CALCIUM 8.8 8.6 8.6 8.8   Liver Function Tests:  Recent Labs Lab 01/08/14 1311  AST 15  ALT 12  ALKPHOS 55  BILITOT 0.3  PROT 6.3  ALBUMIN 3.4*   No results found for this basename: LIPASE, AMYLASE,  in the last 168 hours No results found for this basename: AMMONIA,  in the last 168 hours CBC:  Recent Labs Lab 01/08/14 1311  01/09/14 0315 01/09/14 0759 01/09/14 1610 01/10/14 0353 01/11/14 0320 01/12/14 0327  WBC 14.4*  --  10.8*  --   --  13.2* 12.7* 8.6   NEUTROABS 13.7*  --   --   --   --   --   --   --   HGB 6.5*  < > 9.8* 9.5* 10.5* 9.9* 9.5* 9.2*  HCT 21.1*  < > 29.5* 28.6* 32.3* 30.8* 29.7* 29.1*  MCV 72.3*  --  77.6*  --   --  78.4 78.2 80.4  PLT 434*  --  322  --   --  329 312 290  < > = values in this interval not displayed. Cardiac Enzymes: No results found for this basename: CKTOTAL, CKMB, CKMBINDEX, TROPONINI,  in the last 168 hours BNP: No components found with this basename: POCBNP,  CBG:  Recent Labs Lab 01/10/14 2109 01/11/14 0814 01/11/14 1158 01/11/14 1642 01/11/14 2156  GLUCAP 116* 144* 280* 196* 111*    MRSA PCR SCREENING     Status: None   Collection Time    01/08/14  4:26 PM      Result Value Ref Range Status   MRSA by PCR NEGATIVE  NEGATIVE Final  URINE CULTURE     Status: None   Collection Time    01/08/14  5:28 PM      Result Value Ref Range Status   Specimen Description URINE, CLEAN CATCH   Final   Value: NO GROWTH     Performed at [facility]   Report Status 01/09/2014 FINAL   Final  SURGICAL PCR SCREEN     Status: None   Collection Time    01/12/14  7:23 AM      Result Value Ref Range Status   MRSA, PCR NEGATIVE  NEGATIVE Final   Staphylococcus aureus NEGATIVE  NEGATIVE Final     Studies: Ct Abdomen Pelvis W Contrast 01/10/2014   IMPRESSION: 1. 2.2 cm mass in the cecum is consistent with a large polyp. There is no evidence of extension beyond the cecum. Specifically, no evidence of locally invasive colon carcinoma. 2. No evidence of metastatic disease within the abdomen or pelvis. 3. No acute findings.   Electronically Signed   By: Lajean Manes M.D.   On: 01/10/2014 17:05    Scheduled Meds: . alvimopan  12 mg Oral Once  . amLODipine  5 mg Oral Daily  . cefoTEtan (CEFOTAN)   1 g Intravenous Q12H  .  (RESOURCE BREEZE)  1 Container Oral TID BM  . fluconazole  100 mg Oral Daily  . insulin aspart  0-5 Units Subcutaneous QHS  . insulin aspart  0-9 Units Subcutaneous TID WC  .  levalbuterol  0.63 mg Nebulization QID  . mometasone-formoterol  1 puff Inhalation BID   Continuous Infusions: . sodium chloride    . pantoprozole (PROTONIX) infusion 8 mg/hr (01/12/14 0429)

## 2014-01-12 NOTE — Progress Notes (Signed)
   Name: Amanda [name] MRN: [id number] DOB: [birth year]    ADMISSION DATE:  01/08/2014 CONSULTATION DATE:  01/11/2014  REFERRING MD :  Dr. Rockne Menghini  CHIEF COMPLAINT:  Pre-op respiratory evaluation  BRIEF PATIENT DESCRIPTION:  73 yo female former smoker admitted with dyspnea, fatigue, and anemia.  She was found to have cecal mass, and is scheduled for resection.  She is followed by Dr. Lake Bells for COPD (never had PFT's).  Pulmonary consult requested to assess pre-operative respiratory status.  SIGNIFICANT EVENTS: 6/08 Admit 6/10 CCS consulted 6/12 Laparoscopic-assisted right colectomy, transanal excision of rectal polyp  STUDIES:  6/01 CT chest >> atherosclerosis, mild apical scarring, moderate/severe emphysema 6/10 Colonoscopy >> cecal mass 6/10 CT abdomen/pelvis >> 2.2 cm mass in cecum  SUBJECTIVE:  Mild abdominal discomfort.  Breathing okay.  Denies chest pain.  VITAL SIGNS: Temp:  [97.4 F (36.3 C)-98.2 F (36.8 C)] 97.5 F (36.4 C) (06/12 1615) Pulse Rate:  [74-92] 92 (06/12 1700) Resp:  [8-20] 15 (06/12 1700) BP: (107-184)/(52-73) 152/69 mmHg (06/12 1700) SpO2:  [92 %-100 %] 97 % (06/12 1700) Weight:  [114 lb 6.7 oz (51.9 kg)] 114 lb 6.7 oz (51.9 kg) (06/12 1615)  PHYSICAL EXAMINATION: General: no distress Neuro:  Alert, normal strength HEENT:  Pupils reactive Cardiovascular:  Regular, no murmur Lungs:  Decreased breath sounds, prolonged exhalation, no wheeze/rales Abdomen:  Soft, mild tenderness, wound dressings clean Musculoskeletal:  No edema Skin:  No rashes  CBC Recent Labs     01/10/14  0353  01/11/14  0320  01/12/14  0327  WBC  13.2*  12.7*  8.6  HGB  9.9*  9.5*  9.2*  HCT  30.8*  29.7*  29.1*  PLT  329  312  290   BMET Recent Labs     01/10/14  0353  01/11/14  0320  NA  142  139  K  3.8  3.4*  CL  103  102  CO2  28  26  BUN  10  9  CREATININE  0.98  1.02  GLUCOSE  97  174*    Electrolytes Recent Labs     01/10/14  0353  01/11/14  0320    CALCIUM  8.6  8.8   Glucose Recent Labs     01/11/14  0814  01/11/14  1158  01/11/14  1642  01/11/14  2156  01/12/14  0727  01/12/14  1415  GLUCAP  144*  280*  196*  111*  122*  180*    Imaging No results found.  ASSESSMENT:  COPD/emphysema. P: Oxygen to keep SpO2 > 92% Mobilize as tolerated Bronchial hygiene Change to nebulized brovana/pulmicort with prn xopenex for now >> when more stable, then resume dulera F/u CXR as needed  Colon cancer s/p resection. P: Post op care, nutrition per [facility]  Updated family at bedside.  Chesley Mires, MD [facility] Pulmonary/Critical Care 01/12/2014, 5:33 PM Pager:  [phone] After 3pm call: [phone]

## 2014-01-12 NOTE — Transfer of Care (Signed)
Immediate Anesthesia Transfer of Care Note  Patient: Amanda [name]  Procedure(s) Performed: Procedure(s): LAPAROSCOPIC ASSISTED PARTIAL COLECTOMY AND REMOVAL OF RECTAL POLYP (N/A)  Patient Location: PACU  Anesthesia Type:General  Level of Consciousness: awake, sedated and responds to stimulation  Airway & Oxygen Therapy: Patient Spontanous Breathing and Patient connected to face mask oxygen  Post-op Assessment: Report given to PACU RN and Post -op Vital signs reviewed and stable  Post vital signs: Reviewed and stable  Complications: No apparent anesthesia complications

## 2014-01-13 LAB — BASIC METABOLIC PANEL
BUN: 13 mg/dL (ref 6–23)
CALCIUM: 8.7 mg/dL (ref 8.4–10.5)
CO2: 24 mEq/L (ref 19–32)
Chloride: 101 mEq/L (ref 96–112)
Creatinine, Ser: 0.98 mg/dL (ref 0.50–1.10)
GFR calc non Af Amer: 55 mL/min — ABNORMAL LOW (ref >90)
GFR, EST AFRICAN AMERICAN: 64 mL/min — AB (ref >90)
Glucose, Bld: 263 mg/dL — ABNORMAL HIGH (ref 70–99)
Potassium: 4.2 mEq/L (ref 3.7–5.3)
Sodium: 136 mEq/L — ABNORMAL LOW (ref 137–147)

## 2014-01-13 LAB — GLUCOSE, CAPILLARY
GLUCOSE-CAPILLARY: 254 mg/dL — AB (ref 70–99)
GLUCOSE-CAPILLARY: 151 mg/dL — AB (ref 70–99)
Glucose-Capillary: 84 mg/dL (ref 70–99)
Glucose-Capillary: 114 mg/dL — ABNORMAL HIGH (ref 70–99)

## 2014-01-13 LAB — CBC
HCT: 29.4 % — ABNORMAL LOW (ref 36.0–46.0)
Hemoglobin: 9.2 g/dL — ABNORMAL LOW (ref 12.0–15.0)
MCH: 25.5 pg — ABNORMAL LOW (ref 26.0–34.0)
MCHC: 31.3 g/dL (ref 30.0–36.0)
MCV: 81.4 fL (ref 78.0–100.0)
PLATELETS: 292 10*3/uL (ref 150–400)
RBC: 3.61 MIL/uL — ABNORMAL LOW (ref 3.87–5.11)
RDW: 21.4 % — AB (ref 11.5–15.5)
WBC: 30 10*3/uL — AB (ref 4.0–10.5)

## 2014-01-13 LAB — OSMOLALITY, URINE: Osmolality, Ur: 648 mOsm/kg (ref 390–1090)

## 2014-01-13 MED ORDER — SODIUM CHLORIDE 0.9 % IV SOLN
INTRAVENOUS | Status: DC
  Administered 2014-01-13: 08:00:00 via INTRAVENOUS

## 2014-01-13 MED ORDER — ZOLPIDEM TARTRATE 5 MG PO TABS
5.0000 mg | ORAL_TABLET | Freq: Every evening | ORAL | Status: DC | PRN
  Administered 2014-01-13 – 2014-01-20 (×7): 5 mg via ORAL
  Filled 2014-01-13 (×7): qty 1

## 2014-01-13 MED ORDER — DEXTROSE-NACL 5-0.9 % IV SOLN
INTRAVENOUS | Status: DC
  Administered 2014-01-13 – 2014-01-17 (×4): via INTRAVENOUS

## 2014-01-13 NOTE — Progress Notes (Signed)
1 Day Post-Op  Subjective: Complains of abdominal soreness. Otherwise she seems ok  Objective: Vital signs in last 24 hours: Temp:  [97.2 F (36.2 C)-98.7 F (37.1 C)] 98.6 F (37 C) (06/13 0400) Pulse Rate:  [64-94] 78 (06/13 0600) Resp:  [8-20] 14 (06/13 0600) BP: (116-184)/(46-79) 141/52 mmHg (06/13 0600) SpO2:  [94 %-100 %] 100 % (06/13 0600) Weight:  [109 lb 12.6 oz (49.8 kg)-114 lb 6.7 oz (51.9 kg)] 109 lb 12.6 oz (49.8 kg) (06/13 0400) Last BM Date: 01/11/14  Intake/Output from previous day: 06/12 0701 - 06/13 0700 In: 3229.6 [P.O.:60; I.V.:3169.6] Out: 460 [Urine:410; Blood:50] Intake/Output this shift:    Resp: clear to auscultation bilaterally and slightly elevated work of breathing Cardio: regular rate and rhythm GI: soft, appropriately tender. quiet. incision looks good  Lab Results:   Recent Labs  01/12/14 0327 01/13/14 0313  WBC 8.6 30.0*  HGB 9.2* 9.2*  HCT 29.1* 29.4*  PLT 290 292   BMET  Recent Labs  01/11/14 0320 01/13/14 0313  NA 139 136*  K 3.4* 4.2  CL 102 101  CO2 26 24  GLUCOSE 174* 263*  BUN 9 13  CREATININE 1.02 0.98  CALCIUM 8.8 8.7   PT/INR No results found for this basename: LABPROT, INR,  in the last 72 hours ABG No results found for this basename: PHART, PCO2, PO2, HCO3,  in the last 72 hours  Studies/Results: No results found.  Anti-infectives: Anti-infectives   Start     Dose/Rate Route Frequency Ordered Stop   01/12/14 2200  cefoTEtan (CEFOTAN) 2 g in dextrose 5 % 50 mL IVPB     2 g 100 mL/hr over 30 Minutes Intravenous Every 12 hours 01/12/14 1617 01/12/14 2249   01/12/14 2000  cefoTEtan (CEFOTAN) 1 g in dextrose 5 % 50 mL IVPB  Status:  Discontinued     1 g 100 mL/hr over 30 Minutes Intravenous Every 12 hours 01/11/14 1511 01/12/14 1617   01/12/14 0900  cefoTEtan (CEFOTAN) 1 g in dextrose 5 % 50 mL IVPB  Status:  Discontinued     1 g 100 mL/hr over 30 Minutes Intravenous Every 12 hours 01/11/14 1459 01/11/14  1511   01/12/14 0800  cefoTEtan (CEFOTAN) 2 g in dextrose 5 % 50 mL IVPB     2 g 100 mL/hr over 30 Minutes Intravenous On call 01/12/14 0713 01/12/14 1200   01/09/14 1600  fluconazole (DIFLUCAN) 40 MG/ML suspension 100 mg     100 mg Oral Daily 01/09/14 1333        Assessment/Plan: s/p Procedure(s): LAPAROSCOPIC ASSISTED PARTIAL COLECTOMY AND REMOVAL OF RECTAL POLYP (N/A) Continue ice chips until return of bowel function OOB. PT consult Will continue to monitor in icu for breathing  LOS: 5 days    TOTH III,PAUL S 01/13/2014

## 2014-01-13 NOTE — Progress Notes (Addendum)
Patient ID: Amanda [name], female   DOB: 11/30/1940, 73 y.o.   MRN: [id number] [facility] PROGRESS NOTE  Amanda [name] KLK:[id number] DOB: [birth year] DOA: 01/08/2014 PCP: Tamsen Roers, MD  Brief narrative: 73 y.o. female with a PMH of COPD emphysema, hypertension, type 2 diabetes mellitus, GERD, hyperlipidemia, seen in ED 1 week prior to this presentation for COPD and at which time CT angio chest was negative for PE. At that time she was found to have Hgb of 7.5 and was given oral iron supplementations and subsequently discharged home. Patient was admitted 01/08/14 with complaints of fatigue, generalized weakness, and a four-week history of increasing shortness of breath. She was found to have hemoglobin of 6.5 and patient additionally reported black stools, unintentional 10 pound weight loss which pt attributed to ongoing dysphagia for past 1 month PTA. Hospital course was complicated due to finding of cecal mass on colonoscopy confirmed to be colon cancer. Pt underwent laparoscopic-assisted right colectomy and transanal excision of rectal polyp 01/12/2014 with no subsequent complications.   Assessment and Plan:   Principal Problem:  Acute on chronic anemia secondary to GI bleeding in the setting of newly diagnosed colon cancer  Pt initially admitted to SDU. She has received 2 units PRBC transfusion with post-transfusion Hgb 9.5. Hgb remains stable at 9.2-9.5 range.  Status post colonoscopy 01/10/2014 with findings of cecal mass and multiple polyps. Pt underwent laparoscopic-assisted right colectomy and transanal excision of rectal polyp 01/12/2014 Appreciate surgery and GI following.  Staging CT of abdomen and pelvis done 01/10/14, no evidence of mets. Had CT chest done already on 01/01/14, so would not repeat. Active Problems:  Malignant hypertension  Marked elevation of BP noted 214/143. She is on daily Norvasc 5 mg daily. BP 141/52 this am. 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 F/U EGD for repeat dilation and examination of the upper GI tract distal to esophagusis recommended.  Was on protonix drip until prior to surgery  Continue Diflucan 100 mg daily COPD (chronic obstructive pulmonary disease)  Per pulmonary regimen now includes arformoterol and pulmicort  Supplemental oxygen as needed.  GERD (gastroesophageal reflux disease)  Protonix drip stopped prior to surgery 01/12/2014 Type II or unspecified type diabetes mellitus without mention of complication, uncontrolled  Continue insulin sensitive sliding scale.    DVT prophylaxis: SCD's bilaterally; heparin subQ   Code Status: full code  Family Communication: plan of care discussed with the patient and ger family at the bedside  Disposition Plan: transfer to medical floor  Consultants:  Surgery (Dr. Zella Richer)  GI (Dr. Deatra Ina)  Pulmonary (Dr.Sood) Procedures:  EGD 01/09/14: Tight peptic stricture in the distal esophagus, s/p balloon dilation to 10 mm; Candida esophagitis.  Colonoscopy 01/10/14: 1. Bleeding malignant tumor/mass at the ileocecal valve; multiple biopsies were performed 2. Sessile polyp measuring 2-3 cm in size was found at the hepatic flexure - not removed 3. Sessile polyp measuring 6 mm in size was found at the hepatic flexure - not removed 4. Two sessile polyps measuring 3 and 4 mm in size were found in the transverse colon; polypectomy was performed 5. Sessile polyp measuring 3 mm in size was found in the descending colon; polypectomy was performed 6. Sessile polyp measuring 2 cm in size was found in the rectum; multiple biopsies were performed - not removed  7. There was severe diverticulosis noted in the sigmoid colon Laparoscopic-assisted right colectomy. 2. Transanal excision of rectal  polyp Antibiotics:  Cefotetan pre-operatively  [name], MD  [facility] Pager [phone]  If 7PM-7AM, please contact night-coverage [email or web address] Password [facility] 01/13/2014, 6:26 AM   LOS: 5 days    HPI/Subjective: No acute overnight events.  Objective: Filed Vitals:   01/13/14 0200 01/13/14 0300 01/13/14 0400 01/13/14 0500  BP: 134/64  129/59   Pulse: 80 72 80 82  Temp:   98.6 F (37 C)   TempSrc:   Oral   Resp: 12 8 9 9   Height:      Weight:   49.8 kg (109 lb 12.6 oz)   SpO2: 98% 99% 99% 99%    Intake/Output Summary (Last 24 hours) at 01/13/14 0626 Last data filed at 01/13/14 0500  Gross per 24 hour  Intake 3349.58 ml  Output    285 ml  Net 3064.58 ml    Exam:  General: Pt is alert, sitting in a chair  Cardiovascular: Regular rate and rhythm, S1/S2 appreciated  Respiratory: bilateral air entry, no wheezing   Abdomen: Soft, non tender, non distended, bowel sounds present  Extremities: No edema, has SCD's; pulses DP and PT palpable bilaterally  Neuro: No focal neurologic deficits Data Reviewed: Basic Metabolic Panel:  Recent Labs Lab 01/08/14 1311 01/09/14 0315 01/10/14 0353 01/11/14 0320 01/13/14 0313  NA 136* 138 142 139 136*  K 3.9 3.8 3.8 3.4* 4.2  CL 97 101 103 102 101  CO2 26 25 28 26 24   GLUCOSE 244* 92 97 174* 263*  BUN 12 12 10 9 13   CREATININE 0.90 0.94 0.98 1.02 0.98  CALCIUM 8.8 8.6 8.6 8.8 8.7   Liver Function Tests:  Recent Labs Lab 01/08/14 1311  AST 15  ALT 12  ALKPHOS 55  BILITOT 0.3  PROT 6.3  ALBUMIN 3.4*   No results found for this basename: LIPASE, AMYLASE,  in the last 168 hours No results found for this basename: AMMONIA,  in the last 168 hours CBC:  Recent Labs Lab 01/08/14 1311  01/09/14 0315  01/09/14 1610 01/10/14 0353 01/11/14 0320 01/12/14 0327 01/13/14 0313  WBC 14.4*  --  10.8*  --   --  13.2* 12.7* 8.6 30.0*  NEUTROABS 13.7*  --   --   --   --   --   --   --   --   HGB 6.5*  < > 9.8*   < > 10.5* 9.9* 9.5* 9.2* 9.2*  HCT 21.1*  < > 29.5*  < > 32.3* 30.8* 29.7* 29.1* 29.4*  MCV 72.3*  --  77.6*  --   --  78.4 78.2 80.4 81.4  PLT 434*  --  322  --   --  329 312 290 292  < > = values in this interval not displayed. Cardiac Enzymes: No results found for this basename: CKTOTAL, CKMB, CKMBINDEX, TROPONINI,  in the last 168 hours BNP: No components found with this basename: POCBNP,  CBG:  Recent Labs Lab 01/12/14 0727 01/12/14 1415 01/12/14 1724 01/12/14 1943 01/12/14 2211  GLUCAP 122* 180* 220* 223* 200*    MRSA PCR SCREENING     Status: None   Collection Time    01/08/14  4:26 PM      Result Value Ref Range Status   MRSA by PCR NEGATIVE  NEGATIVE Final  URINE CULTURE     Status: None   Collection Time  01/08/14  5:28 PM      Result Value Ref Range Status   Specimen Description URINE, CLEAN CATCH   Final   Value: NO GROWTH     Performed at [facility]   Report Status 01/09/2014 FINAL   Final  SURGICAL PCR SCREEN     Status: None   Collection Time    01/12/14  7:23 AM      Result Value Ref Range Status   MRSA, PCR NEGATIVE  NEGATIVE Final   Staphylococcus aureus NEGATIVE  NEGATIVE Final     Studies: No results found.  Scheduled Meds: . alvimopan  12 mg Oral BID  . arformoterol  15 mcg Nebulization Q12H  . budesonide (PULMICORT) nebulizer  0.25 mg Nebulization Q12H  . fluconazole  100 mg Oral Daily  . heparin subcutaneous  5,000 Units Subcutaneous 3 times per day  . insulin aspart  0-5 Units Subcutaneous QHS  . insulin aspart  0-9 Units Subcutaneous TID WC  . pantoprazole   40 mg Intravenous Q12H   Continuous Infusions: . dextrose 5% lactated ringers with KCl 20 mEq/L 100 mL/hr at 01/13/14 [phone]

## 2014-01-13 NOTE — Progress Notes (Addendum)
   Name: Truc Winfree MRN: [id number] DOB: [birth year]    ADMISSION DATE:  01/08/2014 CONSULTATION DATE:  01/11/2014  REFERRING MD :  Dr. Rockne Menghini  CHIEF COMPLAINT:  Pre-op respiratory evaluation  BRIEF PATIENT DESCRIPTION:  73 yo female former smoker admitted with dyspnea, fatigue, and anemia.  She was found to have cecal mass, and is scheduled for resection.  She is followed by Dr. Lake Bells for COPD (never had PFT's).  Pulmonary consult requested to assess pre-operative respiratory status.  SIGNIFICANT EVENTS: 6/08 Admit 6/10 [facility] consulted 6/12 Laparoscopic-assisted right colectomy, transanal excision of rectal polyp 6/13- excellent resp status  STUDIES:  6/01 CT chest >> atherosclerosis, mild apical scarring, moderate/severe emphysema 6/10 Colonoscopy >> cecal mass 6/10 CT abdomen/pelvis >> 2.2 cm mass in cecum  SUBJECTIVE:  Wound pain, in a chair, no sob  VITAL SIGNS: Temp:  [97.2 F (36.2 C)-98.7 F (37.1 C)] 98.6 F (37 C) (06/13 0400) Pulse Rate:  [64-94] 78 (06/13 0600) Resp:  [8-20] 14 (06/13 0600) BP: (116-184)/(46-79) 141/52 mmHg (06/13 0600) SpO2:  [94 %-100 %] 100 % (06/13 0600) Weight:  [49.8 kg (109 lb 12.6 oz)-51.9 kg (114 lb 6.7 oz)] 49.8 kg (109 lb 12.6 oz) (06/13 0400)  PHYSICAL EXAMINATION: General: no distress Neuro:  Alert, normal strength HEENT:  Pupils reactive Cardiovascular:  Regular, no murmur s1 s 2 Lungs:  Decreased breath sounds, no wheeze Abdomen:  Soft, mild tenderness, wound dressings clean Musculoskeletal:  No edema Skin:  No rashes  CBC Recent Labs     01/11/14  0320  01/12/14  0327  01/13/14  0313  WBC  12.7*  8.6  30.0*  HGB  9.5*  9.2*  9.2*  HCT  29.7*  29.1*  29.4*  PLT  312  290  292   BMET Recent Labs     01/11/14  0320  01/13/14  0313  NA  139  136*  K  3.4*  4.2  CL  102  101  CO2  26  24  BUN  9  13  CREATININE  1.02  0.98  GLUCOSE  174*  263*    Electrolytes Recent Labs     01/11/14  0320  01/13/14  0313   CALCIUM  8.8  8.7   Glucose Recent Labs     01/11/14  2156  01/12/14  0727  01/12/14  1415  01/12/14  1724  01/12/14  1943  01/12/14  2211  GLUCAP  111*  122*  180*  220*  223*  200*    Imaging No results found.  ASSESSMENT:  COPD/emphysema. P: Oxygen to keep SpO2 > 88-92%, limit high sats Mobilize as tolerated, she appears well in chair this am  IS q4h while awake nebulized brovana/pulmicort with prn xopenex for now dulera on hold I have reviewed her last CT chest and pcxr,  If clinical status change then repeat pcxr  Colon cancer s/p resection. P: Post op care, nutrition per [facility]  Leukocytosis - de margination? Some hemoconcentration? -repeat wbc in am with diff -had abdominal surgery, free water loss limited as laproscopic -allow pos balance, re assess wbc trend, change to saline at 100 -assess urine osm -appears well  Lavon Paganini. Titus Mould, MD, [place] Pgr: [facility]

## 2014-01-13 NOTE — Anesthesia Postprocedure Evaluation (Signed)
  Anesthesia Post-op Note  Patient: Amanda [name]  Procedure(s) Performed: Procedure(s) (LRB): LAPAROSCOPIC ASSISTED PARTIAL COLECTOMY AND REMOVAL OF RECTAL POLYP (N/A)  Patient Location: PACU  Anesthesia Type: General  Level of Consciousness: awake and alert   Airway and Oxygen Therapy: Patient Spontanous Breathing  Post-op Pain: mild  Post-op Assessment: Post-op Vital signs reviewed, Patient's Cardiovascular Status Stable, Respiratory Function Stable, Patent Airway and No signs of Nausea or vomiting  Last Vitals:  Filed Vitals:   01/13/14 0830  BP:   Pulse: 79  Temp:   Resp: 8    Post-op Vital Signs: stable   Complications: No apparent anesthesia complications

## 2014-01-14 ENCOUNTER — Inpatient Hospital Stay (HOSPITAL_COMMUNITY): Payer: Medicare HMO

## 2014-01-14 LAB — GLUCOSE, CAPILLARY: Glucose-Capillary: 177 mg/dL — ABNORMAL HIGH (ref 70–99)

## 2014-01-14 LAB — CBC WITH DIFFERENTIAL/PLATELET
BASOS PCT: 0 % (ref 0–1)
Basophils Absolute: 0 10*3/uL (ref 0.0–0.1)
Eosinophils Absolute: 0.1 10*3/uL (ref 0.0–0.7)
Eosinophils Relative: 1 % (ref 0–5)
HEMATOCRIT: 26.7 % — AB (ref 36.0–46.0)
HEMOGLOBIN: 8.5 g/dL — AB (ref 12.0–15.0)
LYMPHS ABS: 0.7 10*3/uL (ref 0.7–4.0)
LYMPHS PCT: 5 % — AB (ref 12–46)
MCH: 26.2 pg (ref 26.0–34.0)
MCHC: 31.8 g/dL (ref 30.0–36.0)
MCV: 82.2 fL (ref 78.0–100.0)
MONOS PCT: 5 % (ref 3–12)
Monocytes Absolute: 0.7 10*3/uL (ref 0.1–1.0)
NEUTROS ABS: 13.3 10*3/uL — AB (ref 1.7–7.7)
Neutrophils Relative %: 89 % — ABNORMAL HIGH (ref 43–77)
Platelets: 229 10*3/uL (ref 150–400)
RBC: 3.25 MIL/uL — ABNORMAL LOW (ref 3.87–5.11)
RDW: 22.3 % — AB (ref 11.5–15.5)
WBC: 14.8 10*3/uL — AB (ref 4.0–10.5)

## 2014-01-14 LAB — BASIC METABOLIC PANEL
BUN: 12 mg/dL (ref 6–23)
CHLORIDE: 104 meq/L (ref 96–112)
CO2: 22 meq/L (ref 19–32)
CREATININE: 0.95 mg/dL (ref 0.50–1.10)
Calcium: 8.3 mg/dL — ABNORMAL LOW (ref 8.4–10.5)
GFR calc Af Amer: 66 mL/min — ABNORMAL LOW (ref >90)
GFR calc non Af Amer: 57 mL/min — ABNORMAL LOW (ref >90)
GLUCOSE: 193 mg/dL — AB (ref 70–99)
POTASSIUM: 3.7 meq/L (ref 3.7–5.3)
Sodium: 137 mEq/L (ref 137–147)

## 2014-01-14 MED ORDER — POLYETHYLENE GLYCOL 3350 17 G PO PACK
17.0000 g | PACK | Freq: Two times a day (BID) | ORAL | Status: DC
  Administered 2014-01-14 – 2014-01-21 (×6): 17 g via ORAL
  Filled 2014-01-14 (×15): qty 1

## 2014-01-14 MED ORDER — BISACODYL 10 MG RE SUPP: 10.0000 mg | Freq: Once | RECTAL | Status: DC

## 2014-01-14 MED ORDER — DOCUSATE SODIUM 100 MG PO CAPS
100.0000 mg | ORAL_CAPSULE | Freq: Two times a day (BID) | ORAL | Status: DC
  Administered 2014-01-14 – 2014-01-21 (×7): 100 mg via ORAL
  Filled 2014-01-14 (×16): qty 1

## 2014-01-14 NOTE — Progress Notes (Signed)
2 Days Post-Op  Subjective: Complains of some shortness of breath today  Objective: Vital signs in last 24 hours: Temp:  [97.4 F (36.3 C)-98.4 F (36.9 C)] 98.4 F (36.9 C) (06/14 0449) Pulse Rate:  [68-95] 95 (06/14 0449) Resp:  [14-18] 18 (06/14 0449) BP: (122-154)/(55-65) 149/65 mmHg (06/14 0449) SpO2:  [89 %-99 %] 95 % (06/14 0808) Weight:  [109 lb 4.8 oz (49.578 kg)] 109 lb 4.8 oz (49.578 kg) (06/13 1110) Last BM Date: 01/11/14  Intake/Output from previous day: 06/13 0701 - 06/14 0700 In: 421.7 [P.O.:120; I.V.:301.7] Out: 110 [Urine:110] Intake/Output this shift:    Resp: clear to auscultation bilaterally and slight increased work of breathing Cardio: regular rate and rhythm GI: soft, appropriately tender.  Lab Results:   Recent Labs  01/13/14 0313 01/14/14 0555  WBC 30.0* 14.8*  HGB 9.2* 8.5*  HCT 29.4* 26.7*  PLT 292 229   BMET  Recent Labs  01/13/14 0313 01/14/14 0555  NA 136* 137  K 4.2 3.7  CL 101 104  CO2 24 22  GLUCOSE 263* 193*  BUN 13 12  CREATININE 0.98 0.95  CALCIUM 8.7 8.3*   PT/INR No results found for this basename: LABPROT, INR,  in the last 72 hours ABG No results found for this basename: PHART, PCO2, PO2, HCO3,  in the last 72 hours  Studies/Results: No results found.  Anti-infectives: Anti-infectives   Start     Dose/Rate Route Frequency Ordered Stop   01/12/14 2200  cefoTEtan (CEFOTAN) 2 g in dextrose 5 % 50 mL IVPB     2 g 100 mL/hr over 30 Minutes Intravenous Every 12 hours 01/12/14 1617 01/12/14 2249   01/12/14 2000  cefoTEtan (CEFOTAN) 1 g in dextrose 5 % 50 mL IVPB  Status:  Discontinued     1 g 100 mL/hr over 30 Minutes Intravenous Every 12 hours 01/11/14 1511 01/12/14 1617   01/12/14 0900  cefoTEtan (CEFOTAN) 1 g in dextrose 5 % 50 mL IVPB  Status:  Discontinued     1 g 100 mL/hr over 30 Minutes Intravenous Every 12 hours 01/11/14 1459 01/11/14 1511   01/12/14 0800  cefoTEtan (CEFOTAN) 2 g in dextrose 5 % 50 mL  IVPB     2 g 100 mL/hr over 30 Minutes Intravenous On call 01/12/14 0713 01/12/14 1200   01/09/14 1600  fluconazole (DIFLUCAN) 40 MG/ML suspension 100 mg     100 mg Oral Daily 01/09/14 1333        Assessment/Plan: s/p Procedure(s): LAPAROSCOPIC ASSISTED PARTIAL COLECTOMY AND REMOVAL OF RECTAL POLYP (N/A) Concerned about her shortness of breath If this worsens then she may need to go back to icu Hold diet until bowel function returns  LOS: 6 days    TOTH III,PAUL S 01/14/2014

## 2014-01-14 NOTE — Progress Notes (Signed)
Foley catheter removed without difficulty.  Will continue to monitor.

## 2014-01-14 NOTE — Progress Notes (Addendum)
Patient ID: Amanda [name], female   DOB: 1940-12-16, 73 y.o.   MRN: [id number] [facility] PROGRESS NOTE  Keryn Nessler DDU:[id number] DOB: 01-29-1941 DOA: 01/08/2014 PCP: Tamsen Roers, MD  Brief narrative: 73 y.o. female with a PMH of COPD emphysema, hypertension, type 2 diabetes mellitus, GERD, hyperlipidemia, seen in ED 1 week prior to this presentation for COPD and at which time CT angio chest was negative for PE. At that time she was found to have Hgb of 7.5 and was given oral iron supplementations and subsequently discharged home. Patient was admitted 01/08/14 with complaints of fatigue, generalized weakness, and a four-week history of increasing shortness of breath. She was found to have hemoglobin of 6.5 and patient additionally reported black stools, unintentional 10 pound weight loss which pt attributed to ongoing dysphagia for past 1 month PTA. Hospital course was complicated due to finding of cecal mass on colonoscopy confirmed to be colon cancer. Pt underwent laparoscopic-assisted right colectomy and transanal excision of rectal polyp 01/12/2014 with no subsequent complications.   Assessment and Plan:   Principal Problem:  Acute on chronic anemia secondary to GI bleeding in the setting of newly diagnosed colon cancer  Pt initially admitted to SDU. She has received 2 units PRBC transfusion with post-transfusion Hgb 9.5. Hgb remains stable at 9.2-9.5 range.  Status post colonoscopy 01/10/2014 with findings of cecal mass and multiple polyps. Pt underwent laparoscopic-assisted right colectomy and transanal excision of rectal polyp 01/12/2014. Due to concern for possible difficult extubation she was transferred to SDU after surgery. She was doing quite good after surgery and has had no complications since.  Appreciate surgery and GI following.  Staging CT of abdomen and pelvis done 01/10/14, no evidence of mets. Had CT chest done already on 01/01/14, so would not repeat. Active Problems:  Malignant  hypertension  Marked elevation of BP noted 214/143. She is on daily Norvasc 5 mg daily.  Pt also on hydralazine PRN, 10 mg IV every 6 hours PRN. 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 F/U EGD for repeat dilation and examination of the upper GI tract distal to esophagusis recommended.  Was on protonix drip until prior to surgery  Continue Diflucan 100 mg daily COPD (chronic obstructive pulmonary disease)  Per pulmonary regimen now includes arformoterol and pulmicort  Supplemental oxygen as needed.  Was little short of breath this am, we did CXR but it did not show acute findings. Fluids cut down to 50 cc/hr GERD (gastroesophageal reflux disease)  Protonix drip stopped prior to surgery 01/12/2014 Type II or unspecified type diabetes mellitus without mention of complication, uncontrolled  Continue insulin sensitive sliding scale.    DVT prophylaxis: SCD's bilaterally; heparin subQ   Code Status: full code  Family Communication: plan of care discussed with the patient Disposition Plan: remains inpatient   Consultants:  Surgery (Dr. Zella Richer)  GI (Dr. Deatra Ina)  Pulmonary (Dr.Sood) Procedures:  EGD 01/09/14: Tight peptic stricture in the distal esophagus, s/p balloon dilation to 10 mm; Candida esophagitis.  Colonoscopy 01/10/14: 1. Bleeding malignant tumor/mass at the ileocecal valve; multiple biopsies were performed 2. Sessile polyp measuring 2-3 cm in size was found at the hepatic flexure - not removed 3. Sessile polyp measuring 6 mm in size was found at the hepatic flexure - not removed 4. Two sessile polyps measuring 3 and 4 mm in size were found in  the transverse colon; polypectomy was performed 5. Sessile polyp measuring 3 mm in size was found in the descending colon; polypectomy was performed 6. Sessile polyp measuring  2 cm in size was found in the rectum; multiple biopsies were performed - not removed 7. There was severe diverticulosis noted in the sigmoid colon  Laparoscopic-assisted right colectomy. 2. Transanal excision of rectal polyp  Antibiotics:  Cefotetan pre-operatively    [name], MD  [facility] Pager [phone]  If 7PM-7AM, please contact night-coverage [email or web address] Password [facility] 01/14/2014, 6:47 AM   LOS: 6 days    HPI/Subjective: No acute overnight events. Says she feels short of breath.   Objective: Filed Vitals:   01/13/14 1940 01/13/14 1948 01/13/14 2203 01/14/14 0449  BP:   154/64 149/65  Pulse:   68 95  Temp:   97.6 F (36.4 C) 98.4 F (36.9 C)  TempSrc:   Oral Oral  Resp:   14 18  Height:      Weight:      SpO2: 89% 99% 98% 96%    Intake/Output Summary (Last 24 hours) at 01/14/14 0647 Last data filed at 01/13/14 1829  Gross per 24 hour  Intake 521.66 ml  Output    110 ml  Net 411.66 ml    Exam:   General:  Pt is alert, follows commands appropriately, not in acute distress  Cardiovascular: Regular rate and rhythm, S1/S2 appreciated  Respiratory: Clear to auscultation bilaterally, no wheezing  Abdomen: Soft, non distended, bowel sounds present  Extremities: No edema, pulses DP and PT palpable bilaterally  Neuro: Grossly nonfocal  Data Reviewed: Basic Metabolic Panel:  Recent Labs Lab 01/08/14 1311 01/09/14 0315 01/10/14 0353 01/11/14 0320 01/13/14 0313  NA 136* 138 142 139 136*  K 3.9 3.8 3.8 3.4* 4.2  CL 97 101 103 102 101  CO2 26 25 28 26 24   GLUCOSE 244* 92 97 174* 263*  BUN 12 12 10 9 13   CREATININE 0.90 0.94 0.98 1.02 0.98  CALCIUM 8.8 8.6 8.6 8.8 8.7   Liver Function Tests:  Recent Labs Lab 01/08/14 1311  AST 15  ALT 12  ALKPHOS 55  BILITOT 0.3  PROT 6.3  ALBUMIN 3.4*   No results found for this basename: LIPASE, AMYLASE,  in the last 168 hours No results found for this basename: AMMONIA,  in the last 168  hours CBC:  Recent Labs Lab 01/08/14 1311  01/09/14 0315  01/09/14 1610 01/10/14 0353 01/11/14 0320 01/12/14 0327 01/13/14 0313  WBC 14.4*  --  10.8*  --   --  13.2* 12.7* 8.6 30.0*  NEUTROABS 13.7*  --   --   --   --   --   --   --   --   HGB 6.5*  < > 9.8*  < > 10.5* 9.9* 9.5* 9.2* 9.2*  HCT 21.1*  < > 29.5*  < > 32.3* 30.8* 29.7* 29.1* 29.4*  MCV 72.3*  --  77.6*  --   --  78.4 78.2 80.4 81.4  PLT 434*  --  322  --   --  329 312 290 292  < > = values in this interval not displayed. Cardiac Enzymes: No results found for this basename: CKTOTAL, CKMB, CKMBINDEX, TROPONINI,  in the last 168 hours BNP: No components found with this basename: POCBNP,  CBG:  Recent Labs Lab 01/12/14 2211 01/13/14 0754 01/13/14 1201 01/13/14 1652 01/13/14 2147  GLUCAP 200* 254* 84 151* 114*    Recent  Results (from the past 240 hour(s))  MRSA PCR SCREENING     Status: None   Collection Time    01/08/14  4:26 PM      Result Value Ref Range Status   MRSA by PCR NEGATIVE  NEGATIVE Final   Comment:            The GeneXpert MRSA Assay (FDA     approved for NASAL specimens     only), is one component of a     comprehensive MRSA colonization     surveillance program. It is not     intended to diagnose MRSA     infection nor to guide or     monitor treatment for     MRSA infections.  URINE CULTURE     Status: None   Collection Time    01/08/14  5:28 PM      Result Value Ref Range Status   Specimen Description URINE, CLEAN CATCH   Final   Special Requests NONE   Final   Culture  Setup Time     Final   Value: 01/08/2014 22:44     Performed at [facility] Count     Final   Value: NO GROWTH     Performed at [facility]   Culture     Final   Value: NO GROWTH     Performed at [facility]   Report Status 01/09/2014 FINAL   Final  SURGICAL PCR SCREEN     Status: None   Collection Time    01/12/14  7:23 AM      Result Value Ref Range Status   MRSA,  PCR NEGATIVE  NEGATIVE Final   Staphylococcus aureus NEGATIVE  NEGATIVE Final   Comment:            The Xpert SA Assay (FDA     approved for NASAL specimens     in patients over 24 years of age),     is one component of     a comprehensive surveillance     program.  Test performance has     been validated by [employer] for patients greater     than or equal to 11 year old.     It is not intended     to diagnose infection nor to     guide or monitor treatment.     Studies: No results found.  Scheduled Meds: . alvimopan  12 mg Oral BID  . antiseptic oral rinse  15 mL Mouth Rinse q12n4p  . arformoterol  15 mcg Nebulization Q12H  . budesonide (PULMICORT) nebulizer solution  0.25 mg Nebulization Q12H  . chlorhexidine  15 mL Mouth Rinse BID  . fluconazole  100 mg Oral Daily  . heparin subcutaneous  5,000 Units Subcutaneous 3 times per day  . insulin aspart  0-5 Units Subcutaneous QHS  . insulin aspart  0-9 Units Subcutaneous TID WC  . pantoprazole (PROTONIX) IV  40 mg Intravenous Q12H   Continuous Infusions: . dextrose 5 % and 0.9% NaCl 100 mL/hr at 01/13/14 2219  . dextrose 5% lactated ringers with KCl 20 mEq/L Stopped (01/13/14 0820)

## 2014-01-14 NOTE — Progress Notes (Signed)
CXR reviewed. No additional orders. [name]

## 2014-01-15 ENCOUNTER — Encounter (HOSPITAL_COMMUNITY): Payer: Self-pay | Admitting: Internal Medicine

## 2014-01-15 DIAGNOSIS — R5381 Other malaise: Secondary | ICD-10-CM

## 2014-01-15 LAB — GLUCOSE, CAPILLARY
GLUCOSE-CAPILLARY: 156 mg/dL — AB (ref 70–99)
Glucose-Capillary: 119 mg/dL — ABNORMAL HIGH (ref 70–99)
Glucose-Capillary: 303 mg/dL — ABNORMAL HIGH (ref 70–99)
Glucose-Capillary: 88 mg/dL (ref 70–99)

## 2014-01-15 MED ORDER — IPRATROPIUM-ALBUTEROL 0.5-2.5 (3) MG/3ML IN SOLN
3.0000 mL | Freq: Four times a day (QID) | RESPIRATORY_TRACT | Status: DC
  Administered 2014-01-16 – 2014-01-17 (×4): 3 mL via RESPIRATORY_TRACT
  Filled 2014-01-15 (×4): qty 3

## 2014-01-15 MED ORDER — IPRATROPIUM-ALBUTEROL 0.5-2.5 (3) MG/3ML IN SOLN
3.0000 mL | RESPIRATORY_TRACT | Status: DC
Start: 2014-01-15 — End: 2014-01-15
  Administered 2014-01-15 (×3): 3 mL via RESPIRATORY_TRACT
  Filled 2014-01-15 (×3): qty 3

## 2014-01-15 NOTE — Progress Notes (Addendum)
Patient ID: Seniyah Esker, female   DOB: 03-30-1941, 73 y.o.   MRN: [id number] [facility] PROGRESS NOTE  Denine Brotz YTK:[id number] DOB: 08/13/1940 DOA: 01/08/2014 PCP: Tamsen Roers, MD  Brief narrative: 73 y.o. female with a PMH of COPD emphysema, hypertension, type 2 diabetes mellitus, GERD, hyperlipidemia, seen in ED 1 week prior to this presentation for COPD and at which time CT angio chest was negative for PE. At that time she was found to have Hgb of 7.5 and was given oral iron supplementations and subsequently discharged home. Patient was admitted 01/08/14 with complaints of fatigue, generalized weakness, and a four-week history of increasing shortness of breath. She was found to have hemoglobin of 6.5 and patient additionally reported black stools, unintentional 10 pound weight loss which pt attributed to ongoing dysphagia for past 1 month PTA. Hospital course was complicated due to finding of cecal mass on colonoscopy confirmed to be colon cancer. Pt underwent laparoscopic-assisted right colectomy and transanal excision of rectal polyp 01/12/2014 with no subsequent complications.   Assessment and Plan:   Principal Problem:  Acute on chronic anemia secondary to GI bleeding in the setting of newly diagnosed colon cancer  Pt initially admitted to SDU. She has received 2 units PRBC transfusion with post-transfusion Hgb 9.5. Hgb remains stable at 8.5 Status post colonoscopy 01/10/2014 with findings of cecal mass and multiple polyps. Pt underwent laparoscopic-assisted right colectomy and transanal excision of rectal polyp 01/12/2014. Due to concern for possible difficult extubation she was transferred to SDU after surgery. She was doing quite good after surgery and has had no complications since.  Appreciate surgery and GI following.  Staging CT of abdomen and pelvis done 01/10/14, no evidence of mets. Had CT chest done already on 01/01/14, so would not repeat. Active Problems:  Malignant  hypertension  Marked elevation of BP noted 214/143. She is on daily Norvasc 5 mg daily. BP this am 128/56 Pt also on hydralazine PRN, 10 mg IV every 6 hours PRN Dysphagia secondary to esophageal stricture / candida esophagitis  EGD done 01/09/14 with findings of tight peptic stricture in distal esophagus, s/p balloon dilation to 10 mm, candida esophagitis. Plan per GI for EGD in am Was on protonix drip until prior to surgery  Continue Diflucan 100 mg daily COPD (chronic obstructive pulmonary disease)  Stable, pulmonary is following She does note require supplemental oxygen  Was little short of breath 6/14 am but her CXR did not reveal acute findings. We decreased the rate of IV fluids from 100 to 50 cc/hr GERD (gastroesophageal reflux disease)  Protonix drip stopped prior to surgery 01/12/2014 Type II or unspecified type diabetes mellitus without mention of complication, uncontrolled  Continue insulin sensitive sliding scale.  CBG's in past 24 hours: 114, 177, 119   DVT prophylaxis: SCD's bilaterally; heparin subQ   Code Status: full code  Family Communication: plan of care discussed with the patient  Disposition Plan: remains inpatient   Consultants:  Surgery (Dr. Zella Richer)  GI (Dr. Deatra Ina)  Pulmonary (Dr.Sood) Procedures:  EGD 01/09/14: Tight peptic stricture in the distal esophagus, s/p balloon dilation to 10 mm; Candida esophagitis.  Colonoscopy 01/10/14: 1. Bleeding malignant tumor/mass at the ileocecal valve; multiple biopsies were performed 2. Sessile polyp measuring 2-3 cm in size was found at the hepatic flexure - not removed 3. Sessile polyp measuring 6 mm in size was found at the hepatic flexure - not removed 4. Two sessile polyps measuring 3 and 4 mm in size were found in the transverse  colon; polypectomy was performed 5. Sessile polyp measuring 3 mm in size was found in the descending colon; polypectomy was performed 6. Sessile polyp measuring 2 cm in size was found in the  rectum; multiple biopsies were performed - not removed 7. There was severe diverticulosis noted in the sigmoid colon  Laparoscopic-assisted right colectomy. 2. Transanal excision of rectal polyp   Antibiotics:  Cefotetan pre-operatively  [name], MD  [facility] Pager [phone]  If 7PM-7AM, please contact night-coverage [email or web address] Password [email or web address] 01/15/2014, 10:45 AM   LOS: 7 days    HPI/Subjective: No acute overnight events.  Objective: Filed Vitals:   01/14/14 2125 01/14/14 2221 01/15/14 0515 01/15/14 0742  BP: 115/46  128/56   Pulse: 79  93   Temp: 99.2 F (37.3 C)  98.5 F (36.9 C)   TempSrc: Oral  Oral   Resp: 16  16   Height:      Weight:      SpO2: 96% 97% 92% 99%    Intake/Output Summary (Last 24 hours) at 01/15/14 1045 Last data filed at 01/14/14 2200  Gross per 24 hour  Intake    600 ml  Output    500 ml  Net    100 ml    Exam:   General:  Pt is alert, follows commands appropriately, not in acute distress  Cardiovascular: Regular rate and rhythm, S1/S2 appreciated   Respiratory: Clear to auscultation bilaterally, no wheezing  Abdomen: Soft, non tender, non distended; incision noted with no stigmata of infection  Extremities: No edema, pulses DP and PT palpable bilaterally  Neuro: No focal neurologic deficits   Data Reviewed: Basic Metabolic Panel:  Recent Labs Lab 01/09/14 0315 01/10/14 0353 01/11/14 0320 01/13/14 0313 01/14/14 0555  NA 138 142 139 136* 137  K 3.8 3.8 3.4* 4.2 3.7  CL 101 103 102 101 104  CO2 25 28 26 24 22   GLUCOSE 92 97 174* 263* 193*  BUN 12 10 9 13 12   CREATININE 0.94 0.98 1.02 0.98 0.95  CALCIUM 8.6 8.6 8.8 8.7 8.3*   Liver Function Tests:  Recent Labs Lab 01/08/14 1311  AST 15  ALT 12  ALKPHOS 55  BILITOT 0.3  PROT 6.3  ALBUMIN 3.4*   No results found for this basename: LIPASE, AMYLASE,  in the last 168 hours No results found for this basename: AMMONIA,  in the last 168  hours CBC:  Recent Labs Lab 01/08/14 1311  01/10/14 0353 01/11/14 0320 01/12/14 0327 01/13/14 0313 01/14/14 0555  WBC 14.4*  < > 13.2* 12.7* 8.6 30.0* 14.8*  NEUTROABS 13.7*  --   --   --   --   --  13.3*  HGB 6.5*  < > 9.9* 9.5* 9.2* 9.2* 8.5*  HCT 21.1*  < > 30.8* 29.7* 29.1* 29.4* 26.7*  MCV 72.3*  < > 78.4 78.2 80.4 81.4 82.2  PLT 434*  < > 329 312 290 292 229  < > = values in this interval not displayed. Cardiac Enzymes: No results found for this basename: CKTOTAL, CKMB, CKMBINDEX, TROPONINI,  in the last 168 hours BNP: No components found with this basename: POCBNP,  CBG:  Recent Labs Lab 01/13/14 1201 01/13/14 1652 01/13/14 2147 01/14/14 0806 01/15/14 0755  GLUCAP 84 151* 114* 177* 119*    MRSA PCR SCREENING     Status: None   Collection Time    01/08/14  4:26 PM      Result Value Ref Range Status  MRSA by PCR NEGATIVE  NEGATIVE Final  URINE CULTURE     Status: None   Collection Time    01/08/14  5:28 PM      Result Value Ref Range Status   Specimen Description URINE, CLEAN CATCH   Final   Value: NO GROWTH     Performed at [facility]   Report Status 01/09/2014 FINAL   Final  SURGICAL PCR SCREEN     Status: None   Collection Time    01/12/14  7:23 AM      Result Value Ref Range Status   MRSA, PCR NEGATIVE  NEGATIVE Final   Staphylococcus aureus NEGATIVE  NEGATIVE Final     Studies: Dg Chest Port 1 View 01/14/2014    IMPRESSION: 1. Mild congestive heart failure. 2. COPD.   Electronically Signed   By: Marcello Moores  Register   On: 01/14/2014 09:28    Scheduled Meds: . alvimopan  12 mg Oral BID  . budesonide (PULMICORT)  0.25 mg Nebulization Q12H  . docusate sodium  100 mg Oral BID  . fluconazole  100 mg Oral Daily  . heparin subcutaneous  5,000 Units Subcutaneous 3 times per day  . insulin aspart  0-5 Units Subcutaneous QHS  . insulin aspart  0-9 Units Subcutaneous TID WC  . ipratropium-albuterol  3 mL Nebulization Q4H  . pantoprazole   40  mg Intravenous Q12H  . polyethylene glycol  17 g Oral BID   Continuous Infusions: . dextrose 5 % and 0.9% NaCl 50 mL/hr at 01/14/14 [phone]

## 2014-01-15 NOTE — Progress Notes (Signed)
Patient ID: Amanda [name], female   DOB: 18-Sep-1940, 73 y.o.   MRN: [id number] 3 Days Post-Op  Subjective: Pt feels better today.  Passed some flatus.  SOB is better  Objective: Vital signs in last 24 hours: Temp:  [98.2 F (36.8 C)-99.2 F (37.3 C)] 98.5 F (36.9 C) (06/15 0515) Pulse Rate:  [79-102] 93 (06/15 0515) Resp:  [16] 16 (06/15 0515) BP: (115-135)/(46-72) 128/56 mmHg (06/15 0515) SpO2:  [92 %-100 %] 99 % (06/15 0742) Last BM Date: 01/09/14  Intake/Output from previous day: 06/14 0701 - 06/15 0700 In: 600 [I.V.:600] Out: 500 [Urine:500] Intake/Output this shift:    PE: Abd: soft, few BS, ND, appropriately tender  Lab Results:   Recent Labs  01/13/14 0313 01/14/14 0555  WBC 30.0* 14.8*  HGB 9.2* 8.5*  HCT 29.4* 26.7*  PLT 292 229   BMET  Recent Labs  01/13/14 0313 01/14/14 0555  NA 136* 137  K 4.2 3.7  CL 101 104  CO2 24 22  GLUCOSE 263* 193*  BUN 13 12  CREATININE 0.98 0.95  CALCIUM 8.7 8.3*   PT/INR No results found for this basename: LABPROT, INR,  in the last 72 hours CMP     Component Value Date/Time   NA 137 01/14/2014 0555   K 3.7 01/14/2014 0555   CL 104 01/14/2014 0555   CO2 22 01/14/2014 0555   GLUCOSE 193* 01/14/2014 0555   BUN 12 01/14/2014 0555   CREATININE 0.95 01/14/2014 0555   CALCIUM 8.3* 01/14/2014 0555   PROT 6.3 01/08/2014 1311   ALBUMIN 3.4* 01/08/2014 1311   AST 15 01/08/2014 1311   ALT 12 01/08/2014 1311   ALKPHOS 55 01/08/2014 1311   BILITOT 0.3 01/08/2014 1311   GFRNONAA 57* 01/14/2014 0555   GFRAA 66* 01/14/2014 0555   Lipase     Component Value Date/Time   LIPASE 29 07/15/2010 2105       Studies/Results: Dg Chest Port 1 View  01/14/2014   CLINICAL DATA:  Shortness of breath.  EXAM: PORTABLE CHEST - 1 VIEW  COMPARISON:  01/08/2014.  FINDINGS: Mediastinum and hilar structures are normal. Mild cardiomegaly with mild interstitial prominence a right-sided pleural effusion. These findings suggest mild congestive heart failure.  COPD. Right shoulder replacements. No acute bony abnormality .  IMPRESSION: 1. Mild congestive heart failure. 2. COPD.   Electronically Signed   By: Marcello Moores  Register   On: 01/14/2014 09:28    Anti-infectives: Anti-infectives   Start     Dose/Rate Route Frequency Ordered Stop   01/12/14 2200  cefoTEtan (CEFOTAN) 2 g in dextrose 5 % 50 mL IVPB     2 g 100 mL/hr over 30 Minutes Intravenous Every 12 hours 01/12/14 1617 01/12/14 2249   01/12/14 2000  cefoTEtan (CEFOTAN) 1 g in dextrose 5 % 50 mL IVPB  Status:  Discontinued     1 g 100 mL/hr over 30 Minutes Intravenous Every 12 hours 01/11/14 1511 01/12/14 1617   01/12/14 0900  cefoTEtan (CEFOTAN) 1 g in dextrose 5 % 50 mL IVPB  Status:  Discontinued     1 g 100 mL/hr over 30 Minutes Intravenous Every 12 hours 01/11/14 1459 01/11/14 1511   01/12/14 0800  cefoTEtan (CEFOTAN) 2 g in dextrose 5 % 50 mL IVPB     2 g 100 mL/hr over 30 Minutes Intravenous On call 01/12/14 0713 01/12/14 1200   01/09/14 1600  fluconazole (DIFLUCAN) 40 MG/ML suspension 100 mg     100 mg Oral  Daily 01/09/14 1333         Assessment/Plan  1. POD 3, s/p lap assisted right hemicolectomy for adenoca of ICV and polyp at hepatic flexure, and transanal excision of benign polyp in rectum 2. COPD 3. Leukocytosis, improving 4. Deconditioning  Plan: 1. PT/OT eval 2. Will give clear liquids today 3. pulm toilet and mobilize   LOS: 7 days    OSBORNE,KELLY E 01/15/2014, 8:35 AM Pager: [phone]   Seen and agree Amanda [name]

## 2014-01-15 NOTE — Progress Notes (Signed)
Name: Amanda [name] MRN: [id number] DOB: 1940/10/01    ADMISSION DATE:  01/08/2014 CONSULTATION DATE:  01/11/2014  REFERRING MD :  Dr. Rockne Menghini  CHIEF COMPLAINT:  Pre-op respiratory evaluation  BRIEF PATIENT DESCRIPTION:  73 yo female former smoker admitted with dyspnea, fatigue, and anemia.  She was found to have cecal mass, and is scheduled for resection.  She is followed by Dr. Lake Bells for COPD (severe based on CT 01/01/14 and clinical grounds but never had PFT's).  Pulmonary consult requested to assess pre-operative respiratory status.   has a past medical history of Emphysema lung; Hyperlipidemia; GERD (gastroesophageal reflux disease); Vitamin D deficiency; Closed fracture of unspecified part of upper end of humerus; Complete rupture of rotator cuff; Other vitamin B12 deficiency anemia; Neurogenic bladder, NOS; COPD (chronic obstructive pulmonary disease); Type II or unspecified type diabetes mellitus without mention of complication, uncontrolled; and Essential hypertension, benign.   has past surgical history that includes ORIF shoulder fracture (Right, 2011); Tonsillectomy and adenoidectomy (age 55 or 53); Eye surgery (Bilateral, 2010); Esophagogastroduodenoscopy (egd) with propofol (N/A, 01/09/2014); and Colonoscopy (N/A, 01/10/2014).   STUDIES:  6/01 CT chest >> atherosclerosis, mild apical scarring, moderate/severe emphysema 6/10 Colonoscopy >> cecal mass 6/10 CT abdomen/pelvis >> 2.2 cm mass in cecum   SIGNIFICANT EVENTS: 6/08 Admit 6/10 CCS consulted 6/12 Laparoscopic-assisted right colectomy, transanal excision of rectal polyp 6/13- excellent resp status 01/13/14 - denies dyspnea, only wound pain  SUBJECTIVE:   01/15/14: Overall better but reports being deconditioned and a bit more dyspneic due to abdominal pain and restricted breathing  VITAL SIGNS: Temp:  [98.2 F (36.8 C)-99.2 F (37.3 C)] 98.5 F (36.9 C) (06/15 0515) Pulse Rate:  [79-102] 93 (06/15 0515) Resp:  [16] 16  (06/15 0515) BP: (115-135)/(46-72) 128/56 mmHg (06/15 0515) SpO2:  [92 %-100 %] 99 % (06/15 0742)  PHYSICAL EXAMINATION: General: no distress Neuro:  Alert, normal strength HEENT:  Pupils reactive Cardiovascular:  Regular, no murmur s1 s 2 Lungs:  Decreased breath sounds, no wheeze Abdomen:  Soft, mild tenderness, wound dressings clean Musculoskeletal:  No edema Skin:  No rashes  PULMONARY No results found for this basename: PHART, PCO2, PCO2ART, PO2, PO2ART, HCO3, TCO2, O2SAT,  in the last 168 hours  CBC  Recent Labs Lab 01/12/14 0327 01/13/14 0313 01/14/14 0555  HGB 9.2* 9.2* 8.5*  HCT 29.1* 29.4* 26.7*  WBC 8.6 30.0* 14.8*  PLT 290 292 229    COAGULATION  Recent Labs Lab 01/08/14 1311  INR 0.89    CARDIAC  No results found for this basename: TROPONINI,  in the last 168 hours No results found for this basename: PROBNP,  in the last 168 hours   CHEMISTRY  Recent Labs Lab 01/09/14 0315 01/10/14 0353 01/11/14 0320 01/13/14 0313 01/14/14 0555  NA 138 142 139 136* 137  K 3.8 3.8 3.4* 4.2 3.7  CL 101 103 102 101 104  CO2 25 28 26 24 22   GLUCOSE 92 97 174* 263* 193*  BUN 12 10 9 13 12   CREATININE 0.94 0.98 1.02 0.98 0.95  CALCIUM 8.6 8.6 8.8 8.7 8.3*   Estimated Creatinine Clearance: 38.6 ml/min (by C-G formula based on Cr of 0.95).   LIVER  Recent Labs Lab 01/08/14 1311  AST 15  ALT 12  ALKPHOS 55  BILITOT 0.3  PROT 6.3  ALBUMIN 3.4*  INR 0.89     INFECTIOUS No results found for this basename: LATICACIDVEN, PROCALCITON,  in the last 168 hours   ENDOCRINE CBG (  last 3)   Recent Labs  01/13/14 2147 01/14/14 0806 01/15/14 0755  GLUCAP 114* 177* 119*         IMAGING x48h  Dg Chest Port 1 View  01/14/2014   CLINICAL DATA:  Shortness of breath.  EXAM: PORTABLE CHEST - 1 VIEW  COMPARISON:  01/08/2014.  FINDINGS: Mediastinum and hilar structures are normal. Mild cardiomegaly with mild interstitial prominence a right-sided  pleural effusion. These findings suggest mild congestive heart failure. COPD. Right shoulder replacements. No acute bony abnormality .  IMPRESSION: 1. Mild congestive heart failure. 2. COPD.   Electronically Signed   By: Marcello Moores  Register   On: 01/14/2014 09:28      ASSESSMENT:  COPD/emphysema./chronic resp failure   - clinically stable but deconditioning and abdominal incision weighing on resp status. Places her high risk for HCAP or atelectasis  and decompensation P: Oxygen to keep SpO2 > 88-92%, limit high sats Mobilize as tolerated,  Aggressive IS - I emphasized q1h Add Atrovent nebs and change neb structure to duoneb q4h Maintain pulmicort with prn xopenex for now r  Colon cancer s/p resection. P: Post op care, nutrition per CCS     PCCM will see again 01/16/14   Dr. Brand Males, M.D., [facility].C.P Pulmonary and Critical Care Medicine Staff Physician [facility] Pager: [phone], If no answer or between  15:00h - 7:00h: call [phone]  01/15/2014 9:09 AM

## 2014-01-15 NOTE — Progress Notes (Signed)
     [facility] Progress Note  Subjective:  Feeling well.  Going to try clear liquids for the first time since surgery.  Abdomen is sore.  Is passing some flatus.  Would like to do the EGD while she is here this week if possible.  Objective:  Vital signs in last 24 hours: Temp:  [98.2 F (36.8 C)-99.2 F (37.3 C)] 98.5 F (36.9 C) (06/15 0515) Pulse Rate:  [79-102] 93 (06/15 0515) Resp:  [16] 16 (06/15 0515) BP: (115-135)/(46-72) 128/56 mmHg (06/15 0515) SpO2:  [92 %-100 %] 99 % (06/15 0742) Last BM Date: 01/09/14 General:  Alert, Well-developed, in NAD Heart:  Regular rate and rhythm; no murmurs Pulm:  CTAB.  No W/R/R. Abdomen:  Soft, non-distended. Normal bowel sounds.  Appropriately tender.  Incisions noted. Extremities:  Without edema. Neurologic:  Alert and  oriented x4;  grossly normal neurologically. Psych:  Alert and cooperative. Normal mood and affect.  Intake/Output from previous day: 06/14 0701 - 06/15 0700 In: 600 [I.V.:600] Out: 500 [Urine:500]  Lab Results:  Recent Labs  01/13/14 0313 01/14/14 0555  WBC 30.0* 14.8*  HGB 9.2* 8.5*  HCT 29.4* 26.7*  PLT 292 229   BMET  Recent Labs  01/13/14 0313 01/14/14 0555  NA 136* 137  K 4.2 3.7  CL 101 104  CO2 24 22  GLUCOSE 263* 193*  BUN 13 12  CREATININE 0.98 0.95  CALCIUM 8.7 8.3*   Dg Chest Port 1 View  01/14/2014   CLINICAL DATA:  Shortness of breath.  EXAM: PORTABLE CHEST - 1 VIEW  COMPARISON:  01/08/2014.  FINDINGS: Mediastinum and hilar structures are normal. Mild cardiomegaly with mild interstitial prominence a right-sided pleural effusion. These findings suggest mild congestive heart failure. COPD. Right shoulder replacements. No acute bony abnormality .  IMPRESSION: 1. Mild congestive heart failure. 2. COPD.   Electronically Signed   By: Marcello Moores  Register   On: 01/14/2014 09:28    Assessment / Plan: 1.  Colon cancer (cecum). No metastatic disease on CT scan.  S/p lap assisted  right hemicolectomy. 2. Hepatic flexure polyp and rectal polyp:  Removed with hemicolectomy and transanal excision, respectively.    3. Esophageal stricture, s/p dilation on 6/9. Stricture still tight post dilation.  Is scheduled for repeat dilation as outpatient this week on 6/19, but would like to cancel that and perform while she is here this week. 4. Candida esophagitis, getting Diflucan  5. 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  *Spoke with surgery PA-C and she reports that it is likely ok from their standpoint to proceed with EGD.  We are planning tentatively for Wednesday, 6/17.    LOS: 7 days   ZEHR, JESSICA D.  01/15/2014, 9:45 AM  Pager number [phone]  [place] Attending  I have also seen and assessed the patient and agree with the above note. EGD and esophageal dilation tomorrow. The risks and benefits as well as alternatives of endoscopic procedure(s) have been discussed and reviewed. All questions answered. The patient agrees to proceed.  Gatha Mayer, MD, [facility] [phone] (pager) 01/15/2014 6:02 PM

## 2014-01-16 ENCOUNTER — Other Ambulatory Visit: Payer: Self-pay

## 2014-01-16 ENCOUNTER — Encounter (HOSPITAL_COMMUNITY): Payer: Self-pay | Admitting: *Deleted

## 2014-01-16 ENCOUNTER — Inpatient Hospital Stay (HOSPITAL_COMMUNITY): Payer: Medicare HMO | Admitting: *Deleted

## 2014-01-16 ENCOUNTER — Encounter (HOSPITAL_COMMUNITY)
Admission: EM | Disposition: A | Discharge status: Home Health | Payer: Medicare HMO | Source: Home / Self Care | Attending: Internal Medicine

## 2014-01-16 ENCOUNTER — Encounter (HOSPITAL_COMMUNITY): Payer: Medicare HMO | Admitting: *Deleted

## 2014-01-16 DIAGNOSIS — I519 Heart disease, unspecified: Secondary | ICD-10-CM | POA: Diagnosis not present

## 2014-01-16 DIAGNOSIS — J9610 Chronic respiratory failure, unspecified whether with hypoxia or hypercapnia: Secondary | ICD-10-CM | POA: Diagnosis not present

## 2014-01-16 DIAGNOSIS — C180 Malignant neoplasm of cecum: Secondary | ICD-10-CM | POA: Diagnosis not present

## 2014-01-16 DIAGNOSIS — I4891 Unspecified atrial fibrillation: Secondary | ICD-10-CM

## 2014-01-16 DIAGNOSIS — B3781 Candidal esophagitis: Secondary | ICD-10-CM | POA: Diagnosis not present

## 2014-01-16 HISTORY — PX: ESOPHAGOGASTRODUODENOSCOPY: SHX5428

## 2014-01-16 LAB — CBC
HCT: 29.9 % — ABNORMAL LOW (ref 36.0–46.0)
Hemoglobin: 9.5 g/dL — ABNORMAL LOW (ref 12.0–15.0)
MCH: 25.7 pg — ABNORMAL LOW (ref 26.0–34.0)
MCHC: 31.8 g/dL (ref 30.0–36.0)
MCV: 81 fL (ref 78.0–100.0)
Platelets: 240 10*3/uL (ref 150–400)
RBC: 3.69 MIL/uL — ABNORMAL LOW (ref 3.87–5.11)
RDW: 22 % — ABNORMAL HIGH (ref 11.5–15.5)
WBC: 7.2 10*3/uL (ref 4.0–10.5)

## 2014-01-16 LAB — GLUCOSE, CAPILLARY
Glucose-Capillary: 137 mg/dL — ABNORMAL HIGH (ref 70–99)
Glucose-Capillary: 171 mg/dL — ABNORMAL HIGH (ref 70–99)

## 2014-01-16 LAB — BASIC METABOLIC PANEL
BUN: 5 mg/dL — ABNORMAL LOW (ref 6–23)
BUN: 4 mg/dL — ABNORMAL LOW (ref 6–23)
CO2: 27 mEq/L (ref 19–32)
CO2: 29 mEq/L (ref 19–32)
Calcium: 8.6 mg/dL (ref 8.4–10.5)
Calcium: 8.7 mg/dL (ref 8.4–10.5)
Chloride: 100 mEq/L (ref 96–112)
Chloride: 103 mEq/L (ref 96–112)
Creatinine, Ser: 0.7 mg/dL (ref 0.50–1.10)
Creatinine, Ser: 0.67 mg/dL (ref 0.50–1.10)
GFR calc Af Amer: >90 mL/min (ref >90)
GFR calc non Af Amer: 84 mL/min — ABNORMAL LOW (ref >90)
GFR, EST NON AFRICAN AMERICAN: 83 mL/min — AB (ref >90)
Glucose, Bld: 195 mg/dL — ABNORMAL HIGH (ref 70–99)
Glucose, Bld: 148 mg/dL — ABNORMAL HIGH (ref 70–99)
Potassium: 3.2 mEq/L — ABNORMAL LOW (ref 3.7–5.3)
Potassium: 3 mEq/L — ABNORMAL LOW (ref 3.7–5.3)
SODIUM: 137 meq/L (ref 137–147)
Sodium: 140 mEq/L (ref 137–147)

## 2014-01-16 LAB — TSH: TSH: 1.05 u[IU]/mL (ref 0.350–4.500)

## 2014-01-16 SURGERY — EGD (ESOPHAGOGASTRODUODENOSCOPY)
Anesthesia: Monitor Anesthesia Care

## 2014-01-16 MED ORDER — HYDRALAZINE HCL 20 MG/ML IJ SOLN: 10.0000 mg | Freq: Four times a day (QID) | INTRAMUSCULAR | Status: DC | PRN

## 2014-01-16 MED ORDER — LACTATED RINGERS IV SOLN
INTRAVENOUS | Status: DC | PRN
  Administered 2014-01-16: 12:00:00 via INTRAVENOUS

## 2014-01-16 MED ORDER — PROPOFOL 10 MG/ML IV BOLUS
INTRAVENOUS | Status: AC
  Filled 2014-01-16: qty 20

## 2014-01-16 MED ORDER — PROPOFOL INFUSION 10 MG/ML OPTIME
INTRAVENOUS | Status: DC | PRN
  Administered 2014-01-16: 100 ug/kg/min via INTRAVENOUS

## 2014-01-16 MED ORDER — AMLODIPINE BESYLATE 5 MG PO TABS
5.0000 mg | ORAL_TABLET | Freq: Every day | ORAL | Status: DC
  Administered 2014-01-16 – 2014-01-21 (×6): 5 mg via ORAL
  Filled 2014-01-16 (×7): qty 1

## 2014-01-16 MED ORDER — POTASSIUM CHLORIDE 10 MEQ/100ML IV SOLN
10.0000 meq | INTRAVENOUS | Status: AC
  Administered 2014-01-16 (×3): 10 meq via INTRAVENOUS
  Filled 2014-01-16 (×4): qty 100

## 2014-01-16 MED ORDER — DILTIAZEM HCL 25 MG/5ML IV SOLN
5.0000 mg | Freq: Once | INTRAVENOUS | Status: AC
  Administered 2014-01-16: 5 mg via INTRAVENOUS
  Filled 2014-01-16 (×2): qty 5

## 2014-01-16 MED ORDER — SODIUM CHLORIDE 0.9 % IV SOLN: INTRAVENOUS | Status: DC

## 2014-01-16 MED ORDER — METOPROLOL TARTRATE 25 MG PO TABS
25.0000 mg | ORAL_TABLET | Freq: Two times a day (BID) | ORAL | Status: DC
Start: 2014-01-16 — End: 2014-01-21
  Administered 2014-01-16 – 2014-01-21 (×11): 25 mg via ORAL
  Filled 2014-01-16 (×14): qty 1

## 2014-01-16 NOTE — Progress Notes (Signed)
Name: Amanda [name] MRN: [id number] DOB: 1940/11/06    ADMISSION DATE:  01/08/2014 CONSULTATION DATE:  01/11/2014  REFERRING MD :  Dr. Rockne Menghini  CHIEF COMPLAINT:  Pre-op respiratory evaluation  BRIEF PATIENT DESCRIPTION:  73 yo female former smoker admitted with dyspnea, fatigue, and anemia.  She was found to have cecal mass, and is scheduled for resection.  She is followed by Dr. Lake Bells for COPD (severe based on CT 01/01/14 and clinical grounds but never had PFT's).  Pulmonary consult requested to assess pre-operative respiratory status.   has a past medical history of Emphysema lung; Hyperlipidemia; GERD (gastroesophageal reflux disease); Vitamin D deficiency; Closed fracture of unspecified part of upper end of humerus; Complete rupture of rotator cuff; Other vitamin B12 deficiency anemia; Neurogenic bladder, NOS; COPD (chronic obstructive pulmonary disease); Type II or unspecified type diabetes mellitus without mention of complication, uncontrolled; Essential hypertension, benign; and Esophageal stricture (12/28/2013).   has past surgical history that includes ORIF shoulder fracture (Right, 2011); Tonsillectomy and adenoidectomy (age 87 or 60); Eye surgery (Bilateral, 2010); Esophagogastroduodenoscopy (egd) with propofol (N/A, 01/09/2014); and Colonoscopy (N/A, 01/10/2014).   STUDIES:  6/01 CT chest >> atherosclerosis, mild apical scarring, moderate/severe emphysema 6/10 Colonoscopy >> cecal mass 6/10 CT abdomen/pelvis >> 2.2 cm mass in cecum   SIGNIFICANT EVENTS: 6/08 Admit 6/10 CCS consulted 6/12 Laparoscopic-assisted right colectomy, transanal excision of rectal polyp 6/13- excellent resp status 01/13/14 - denies dyspnea, only wound pain 01/15/14: Overall better but reports being deconditioned and a bit more dyspneic due to abdominal pain and restricted breathing   SUBJECTIVE/OVERNIGHT/INTERVAL HX 01/16/14: Eso dilatation today. Still deconditioned but improved. REluctantly open to going to  SNF rehab if need be  VITAL SIGNS: Temp:  [98.2 F (36.8 C)-98.5 F (36.9 C)] 98.3 F (36.8 C) (06/16 0628) Pulse Rate:  [90-106] 90 (06/16 0628) Resp:  [16-20] 20 (06/16 0628) BP: (120-127)/(60-99) 127/99 mmHg (06/16 0628) SpO2:  [91 %-98 %] 98 % (06/16 0628)  PHYSICAL EXAMINATION: General: no distress, more conditioned but still deconditioned. Sitting in chair as 24h ago Neuro:  Alert,Moves all 4s HEENT:  Pupils reactive Cardiovascular:  Regular, no murmur s1 s 2 Lungs:  Decreased breath sounds, no wheeze Abdomen:  Soft, mild tenderness, wound dressings clean Musculoskeletal:  No edema Skin:  No rashes  PULMONARY No results found for this basename: PHART, PCO2, PCO2ART, PO2, PO2ART, HCO3, TCO2, O2SAT,  in the last 168 hours  CBC  Recent Labs Lab 01/13/14 0313 01/14/14 0555 01/16/14 1010  HGB 9.2* 8.5* 9.5*  HCT 29.4* 26.7* 29.9*  WBC 30.0* 14.8* 7.2  PLT 292 229 240    COAGULATION No results found for this basename: INR,  in the last 168 hours  CARDIAC  No results found for this basename: TROPONINI,  in the last 168 hours No results found for this basename: PROBNP,  in the last 168 hours   CHEMISTRY  Recent Labs Lab 01/10/14 0353 01/11/14 0320 01/13/14 0313 01/14/14 0555  NA 142 139 136* 137  K 3.8 3.4* 4.2 3.7  CL 103 102 101 104  CO2 28 26 24 22   GLUCOSE 97 174* 263* 193*  BUN 10 9 13 12   CREATININE 0.98 1.02 0.98 0.95  CALCIUM 8.6 8.8 8.7 8.3*   Estimated Creatinine Clearance: 38.6 ml/min (by C-G formula based on Cr of 0.95).   LIVER No results found for this basename: AST, ALT, ALKPHOS, BILITOT, PROT, ALBUMIN, INR,  in the last 168 hours   INFECTIOUS No results found for  this basename: LATICACIDVEN, PROCALCITON,  in the last 168 hours   ENDOCRINE CBG (last 3)   Recent Labs  01/15/14 1213 01/15/14 1637 01/15/14 2101  GLUCAP 303* 88 156*         IMAGING x48h  No results found.    ASSESSMENT:  COPD/emphysema./chronic  resp failure   - clinically stable but deconditioning and abdominal incision weighing on resp status. BUt improved 01/16/14  P: Oxygen to keep SpO2 > 88-92%, limit high sats Mobilize as tolerated,  Aggressive IS - I emphasized q1h to her Neb duoneb q4h Maintain pulmicort with prn xopenex for now; might need QVAR mdi at dc due to cost r  Colon cancer s/p resection. P: Post op care, nutrition per CCS     PCCM will see  Intermittently. Needs SNF reab v Home PT    Dr. Brand Males, M.D., [facility].C.P Pulmonary and Critical Care Medicine Staff Physician [place] [facility] Pager: [phone], If no answer or between  15:00h - 7:00h: call [phone]  01/16/2014 10:35 AM

## 2014-01-16 NOTE — Progress Notes (Signed)
PT Cancellation Note  Patient Details Name: Amanda [name] MRN: [id number] DOB: September 25, 1940   Cancelled Treatment:    Reason Eval/Treat Not Completed: Medical issues which prohibited therapy--pt transferred to SDU. Will hold PT today and check back on tomorrow. Weston Anna, MPT Pager: [phone]

## 2014-01-16 NOTE — Discharge Instructions (Addendum)
[facility]      [facility] °[phone] ° °OPEN ABDOMINAL SURGERY: POST OP INSTRUCTIONS ° °Always review your discharge instruction sheet given to you by the facility where your surgery was performed. ° °IF YOU HAVE DISABILITY OR FAMILY LEAVE FORMS, YOU MUST BRING THEM TO THE OFFICE FOR PROCESSING.  PLEASE DO NOT GIVE THEM TO YOUR DOCTOR. ° °1. A prescription for pain medication may be given to you upon discharge.  Take your pain medication as prescribed, if needed.  If narcotic pain medicine is not needed, then you may take acetaminophen (Tylenol) or ibuprofen (Advil) as needed. °2. Take your usually prescribed medications unless otherwise directed. °3. If you need a refill on your pain medication, please contact your pharmacy. They will contact our office to request authorization.  Prescriptions will not be filled after 5pm or on week-ends. °4. You should follow a light diet the first few days after arrival home, such as soup and crackers, pudding, etc.unless your doctor has advised otherwise. A high-fiber, low fat diet can be resumed as tolerated.   Be sure to include lots of fluids daily. Most patients will experience some swelling and bruising on the chest and neck area.  Ice packs will help.  Swelling and bruising can take several days to resolve °5. Most patients will experience some swelling and bruising in the area of the incision. Ice pack will help. Swelling and bruising can take several days to resolve..  °6. It is common to experience some constipation if taking pain medication after surgery.  Increasing fluid intake and taking a stool softener will usually help or prevent this problem from occurring.  A mild laxative (Milk of Magnesia or Miralax) should be taken according to package directions if there are no bowel movements after 48 hours. °7.  You may have steri-strips (small skin tapes) in place directly over the incision.  These strips should be left on the skin for 7-10 days.  If your  surgeon used skin glue on the incision, you may shower in 24 hours.  The glue will flake off over the next 2-3 weeks.  Any sutures or staples will be removed at the office during your follow-up visit. You may find that a light gauze bandage over your incision may keep your staples from being rubbed or pulled. You may shower and replace the bandage daily. °8. ACTIVITIES:  You may resume regular (light) daily activities beginning the next day--such as daily self-care, walking, climbing stairs--gradually increasing activities as tolerated.  You may have sexual intercourse when it is comfortable.  Refrain from any heavy lifting or straining until approved by your doctor. °a. You may drive when you no longer are taking prescription pain medication, you can comfortably wear a seatbelt, and you can safely maneuver your car and apply brakes °b. Return to Work: ___________________________________ °9. You should see your doctor in the office for a follow-up appointment approximately two weeks after your surgery.  Make sure that you call for this appointment within a day or two after you arrive home to insure a convenient appointment time. °OTHER INSTRUCTIONS:  °_____________________________________________________________ °_____________________________________________________________ ° °WHEN TO CALL YOUR DOCTOR: °1. Fever over 101.0 °2. Inability to urinate °3. Nausea and/or vomiting °4. Extreme swelling or bruising °5. Continued bleeding from incision. °6. Increased pain, redness, or drainage from the incision. °7. Difficulty swallowing or breathing °8. Muscle cramping or spasms. °9. Numbness or tingling in hands or feet or around lips. ° °The clinic staff is available to   answer your questions during regular business hours.  Please dont hesitate to call and ask to speak to one of the nurses if you have concerns.  For further questions, please visit [email or web address]    You were cared for by a hospitalist  during your hospital stay. If you have any questions about your discharge medications or the care you received while you were in the hospital after you are discharged, you can call the unit and asked to speak with the hospitalist on call if the hospitalist that took care of you is not available. Once you are discharged, your primary care physician will handle any further medical issues. Please note that NO REFILLS for any discharge medications will be authorized once you are discharged, as it is imperative that you return to your primary care physician (or establish a relationship with a primary care physician if you do not have one) for your aftercare needs so that they can reassess your need for medications and monitor your lab values.     If you do not have a primary care physician, you can call [phone] for a physician referral.  Follow with Primary MD Tamsen Roers, MD in 5-7 days   Get CBC, CMP checked by your doctor and again as further instructed.  Get a 2 view Chest X ray done next visit if you had Pneumonia of Lung problems at the [place] [facility] reviewed and adjusted.  Please request your Prim.MD to go over all Hospital Tests and Procedure/Radiological results at the follow up, please get all Hospital records sent to your Prim MD by signing hospital release before you go home.  Activity: As tolerated with Full fall precautions use walker/cane & assistance as needed  Diet: soft  For Heart failure patients - Check your Weight same time everyday, if you gain over 2 pounds, or you develop in leg swelling, experience more shortness of breath or chest pain, call your Primary MD immediately. Follow Cardiac Low Salt Diet and 1.8 lit/day fluid restriction.  Disposition Home   If you experience worsening of your admission symptoms, develop shortness of breath, life threatening emergency, suicidal or homicidal thoughts you must seek medical attention immediately by calling 911 or  calling your MD immediately  if symptoms less severe.  You Must read complete instructions/literature along with all the possible adverse reactions/side effects for all the Medicines you take and that have been prescribed to you. Take any new Medicines after you have completely understood and accpet all the possible adverse reactions/side effects.   Do not drive and provide baby sitting services if your were admitted for syncope or siezures until you have seen by Primary MD or a Neurologist and advised to do so again.  Do not drive when taking Pain medications.   Do not take more than prescribed Pain, Sleep and Anxiety Medications  Special Instructions: If you have smoked or chewed Tobacco  in the last 2 yrs please stop smoking, stop any regular Alcohol  and or any Recreational drug use.  Wear Seat belts while driving.    Your      Medication List    STOP taking these medications       predniSONE 10 MG tablet  Commonly known as:  DELTASONE      TAKE these medications       amLODipine 5 MG tablet  Commonly known as:  NORVASC  Take 1 tablet (5 mg total) by mouth daily.     feeding supplement (RESOURCE BREEZE) Liqd  Take 1 Container by mouth 3 (three) times daily between meals.     ferrous sulfate 325 (65 FE) MG tablet  Take 1 tablet (325 mg total) by mouth daily.     metFORMIN 500 MG tablet  Commonly known as:  GLUCOPHAGE  Take 1 tablet (500 mg total) by mouth 2 (two) times daily with a meal.     metoprolol tartrate 25 MG tablet  Commonly known as:  LOPRESSOR  Take 1 tablet (25 mg total) by mouth 2 (two) times daily.     mometasone-formoterol 100-5 MCG/ACT Aero  Commonly known as:  DULERA  Inhale 1 puff into the lungs 2 (two) times daily.     omeprazole 40 MG capsule  Commonly known as:  PRILOSEC  Take 1 capsule (40 mg total) by mouth 2 (two) times daily before lunch and supper.     PROAIR HFA 108 (90 BASE) MCG/ACT inhaler  Generic drug:  albuterol  Inhale 2  puffs into the lungs 4 (four) times daily.     albuterol (2.5 MG/3ML) 0.083% nebulizer solution  Commonly known as:  PROVENTIL  Take 2.5 mg by nebulization 4 (four) times daily.       Please take your medications as prescribed.   Amlodipine and Metoprolol are for blood pressure and to control your heart rate. Metformin is to control your blood sugars.

## 2014-01-16 NOTE — Progress Notes (Signed)
Report called to Jessica/ICU

## 2014-01-16 NOTE — Progress Notes (Signed)
OT Cancellation Note  Patient Details Name: Amanda [name] MRN: [id number] DOB: 1940-12-10   Cancelled Treatment:    Reason Eval/Treat Not Completed: Medical issues which prohibited therapy. Note events of today and transfer to SDU. Please clarify when pt appropriate for OOB with therapy.   Jules Schick [phone] 01/16/2014, 12:22 PM

## 2014-01-16 NOTE — Anesthesia Postprocedure Evaluation (Signed)
  Anesthesia Post-op Note  Patient: Amanda [name]  Procedure(s) Performed: Procedure(s): ESOPHAGOGASTRODUODENOSCOPY (EGD) (N/A)  Patient Location: ICU  Anesthesia Type:MAC  Level of Consciousness: awake, oriented and patient cooperative  Airway and Oxygen Therapy: Patient Spontanous Breathing  Post-op Pain: none  Post-op Assessment: PATIENT'S CARDIOVASCULAR STATUS UNSTABLE  Post-op Vital Signs: Reviewed  Last Vitals:  Filed Vitals:   01/16/14 1310  BP: 177/77  Pulse: 86  Temp:   Resp: 17    Complications: No apparent anesthesia complications

## 2014-01-16 NOTE — Progress Notes (Signed)
Patient ID: Amanda [name], female   DOB: [birth year], 73 y.o.   MRN: [id number] 4 Days Post-Op  Subjective: Pt doing better.  Had small BM and tolerated clears well yesterday.  For upper Endo today  Objective: Vital signs in last 24 hours: Temp:  [98.2 F (36.8 C)-98.5 F (36.9 C)] 98.3 F (36.8 C) (06/16 0628) Pulse Rate:  [90-106] 90 (06/16 0628) Resp:  [16-20] 20 (06/16 0628) BP: (120-127)/(60-99) 127/99 mmHg (06/16 0628) SpO2:  [91 %-98 %] 98 % (06/16 0628) Last BM Date: 01/09/14  Intake/Output from previous day: 06/15 0701 - 06/16 0700 In: 360 [P.O.:360] Out: 500 [Urine:500] Intake/Output this shift:    PE: Abd: soft, appropriately tender, +BS, ND, incisions c/d/i with staples  Lab Results:   Recent Labs  01/14/14 0555  WBC 14.8*  HGB 8.5*  HCT 26.7*  PLT 229   BMET  Recent Labs  01/14/14 0555  NA 137  K 3.7  CL 104  CO2 22  GLUCOSE 193*  BUN 12  CREATININE 0.95  CALCIUM 8.3*   PT/INR No results found for this basename: LABPROT, INR,  in the last 72 hours CMP     Component Value Date/Time   NA 137 01/14/2014 0555   K 3.7 01/14/2014 0555   CL 104 01/14/2014 0555   CO2 22 01/14/2014 0555   GLUCOSE 193* 01/14/2014 0555   BUN 12 01/14/2014 0555   CREATININE 0.95 01/14/2014 0555   CALCIUM 8.3* 01/14/2014 0555   PROT 6.3 01/08/2014 1311   ALBUMIN 3.4* 01/08/2014 1311   AST 15 01/08/2014 1311   ALT 12 01/08/2014 1311   ALKPHOS 55 01/08/2014 1311   BILITOT 0.3 01/08/2014 1311   GFRNONAA 57* 01/14/2014 0555   GFRAA 66* 01/14/2014 0555   Lipase     Component Value Date/Time   LIPASE 29 07/15/2010 2105       Studies/Results: Dg Chest Port 1 View  01/14/2014   CLINICAL DATA:  Shortness of breath.  EXAM: PORTABLE CHEST - 1 VIEW  COMPARISON:  01/08/2014.  FINDINGS: Mediastinum and hilar structures are normal. Mild cardiomegaly with mild interstitial prominence a right-sided pleural effusion. These findings suggest mild congestive heart failure. COPD. Right shoulder  replacements. No acute bony abnormality .  IMPRESSION: 1. Mild congestive heart failure. 2. COPD.   Electronically Signed   By: Marcello Moores  Register   On: 01/14/2014 09:28    Anti-infectives: Anti-infectives   Start     Dose/Rate Route Frequency Ordered Stop   01/12/14 2200  cefoTEtan (CEFOTAN) 2 g in dextrose 5 % 50 mL IVPB     2 g 100 mL/hr over 30 Minutes Intravenous Every 12 hours 01/12/14 1617 01/12/14 2249   01/12/14 2000  cefoTEtan (CEFOTAN) 1 g in dextrose 5 % 50 mL IVPB  Status:  Discontinued     1 g 100 mL/hr over 30 Minutes Intravenous Every 12 hours 01/11/14 1511 01/12/14 1617   01/12/14 0900  cefoTEtan (CEFOTAN) 1 g in dextrose 5 % 50 mL IVPB  Status:  Discontinued     1 g 100 mL/hr over 30 Minutes Intravenous Every 12 hours 01/11/14 1459 01/11/14 1511   01/12/14 0800  cefoTEtan (CEFOTAN) 2 g in dextrose 5 % 50 mL IVPB     2 g 100 mL/hr over 30 Minutes Intravenous On call 01/12/14 0713 01/12/14 1200   01/09/14 1600  fluconazole (DIFLUCAN) 40 MG/ML suspension 100 mg     100 mg Oral Daily 01/09/14 1333  Assessment/Plan  1. POD 4, s/p lap assisted right hemicolectomy and transanal excision of benign polyp 2. adenoca of colon, surgical path pending 3. COPD  Plan: 1. Doing well surgically.  After endo may advance to full liquids from our standpoint. 2. Cont mobilization and pulm toilet.   LOS: 8 days    Trinetta Alemu E 01/16/2014, 8:27 AM Pager: [phone]

## 2014-01-16 NOTE — Progress Notes (Signed)
NUTRITION FOLLOW UP  Intervention:   -Diet advancement per MD -Continue to recommend Resource Breeze TID as tolerated -RD to continue to monitor  Nutrition Dx:   Inadequate oral intake related to clear liquid diet as evidenced by diet order; ongoing now related to NPO status    Goal:  Advance diet as tolerated to diabetic diet-not met   Monitor:   Diet order, total protein/energy intake, labs, weight, GI profile, swallow profile  Assessment:   6/09: - Pt and family report pt eats 4-6 small well balanced meals/day at home  - Pt c/o dysphagia x 15 years with 10 pound unintended weight loss in the past month  - Pt reports 2-3 times/week food will get stuck in the bottom of her throat and she will have to vomit it back up. States the worst is cornbread and dry bread.  - Not on any nutritional supplements at home  - Eating a popsicle during visit, did not perform nutrition focused physical exam    6/16: -Pt newly dx colon cancer, s/p colectomy on 6/12 d/t cecal mass and multiple polyps per MD -Tolerating clear liquid diet, consuming 65-75%. Received Resource Breeze on 6/10-6/11, then was cancelled d/t NPO status. Will continue to recommend as diet advancement tolerated -Receiving Diflucan for esophagitis candidiasis -Was NPO for EGD r/t dysphagia. EGD was cancelled when pt went into Afib w/RVR per MD    Height: Ht Readings from Last 1 Encounters:  01/13/14 5' 1"  (1.549 m)    Weight Status:   Wt Readings from Last 1 Encounters:  01/13/14 109 lb 4.8 oz (49.578 kg)    Re-estimated needs:  Kcal: 1250-1450  Protein: 50-60g  Fluid: 1.2-1.4L/day   Skin: surgical incision on abdomen  Diet Order: NPO   Intake/Output Summary (Last 24 hours) at 01/16/14 1250 Last data filed at 01/16/14 1236  Gross per 24 hour  Intake    560 ml  Output   1075 ml  Net   -515 ml    Last BM: 6/09, flatus on 6/15   Labs:   Recent Labs Lab 01/13/14 0313 01/14/14 0555 01/16/14 1010   NA 136* 137 137  K 4.2 3.7 3.2*  CL 101 104 100  CO2 24 22 27   BUN 13 12 5*  CREATININE 0.98 0.95 0.70  CALCIUM 8.7 8.3* 8.6  GLUCOSE 263* 193* 195*    CBG (last 3)   Recent Labs  01/15/14 1213 01/15/14 1637 01/15/14 2101  GLUCAP 303* 88 156*    Scheduled Meds: . alvimopan  12 mg Oral BID  . amLODipine  5 mg Oral Daily  . antiseptic oral rinse  15 mL Mouth Rinse q12n4p  . budesonide (PULMICORT) nebulizer solution  0.25 mg Nebulization Q12H  . chlorhexidine  15 mL Mouth Rinse BID  . diltiazem  5 mg Intravenous Once  . docusate sodium  100 mg Oral BID  . fluconazole  100 mg Oral Daily  . heparin subcutaneous  5,000 Units Subcutaneous 3 times per day  . insulin aspart  0-5 Units Subcutaneous QHS  . insulin aspart  0-9 Units Subcutaneous TID WC  . ipratropium-albuterol  3 mL Nebulization QID  . pantoprazole (PROTONIX) IV  40 mg Intravenous Q12H  . polyethylene glycol  17 g Oral BID  . potassium chloride  10 mEq Intravenous Q1 Hr x 3    Continuous Infusions: . sodium chloride    . dextrose 5 % and 0.9% NaCl 50 mL/hr at 01/14/14 [place]  Gelene Mink MS RD LDN Clinical Dietitian [phone]

## 2014-01-16 NOTE — Progress Notes (Signed)
Patient ID: Amanda [name], female   DOB: 03-13-1941, 73 y.o.   MRN: [id number] [facility] PROGRESS NOTE  Amanda [name] GYI:[id number] DOB: 1941/06/11 DOA: 01/08/2014 PCP: Tamsen Roers, MD  Brief narrative: 73 y.o. female with a PMH of COPD emphysema, hypertension, type 2 diabetes mellitus, GERD, hyperlipidemia, seen in ED 1 week prior to this presentation for COPD and at which time CT angio chest was negative for PE. At that time she was found to have Hgb of 7.5 and was given oral iron supplementations and subsequently discharged home. Patient was admitted 01/08/14 with complaints of fatigue, generalized weakness, and a four-week history of increasing shortness of breath. She was found to have hemoglobin of 6.5 and patient additionally reported black stools, unintentional 10 pound weight loss which pt attributed to ongoing dysphagia for past 1 month PTA. Hospital course was complicated due to finding of cecal mass on colonoscopy confirmed to be colon cancer. Pt underwent laparoscopic-assisted right colectomy and transanal excision of rectal polyp 01/12/2014. She was scheduled for EGD 01/16/2014 but prior to procedure went in to A fib requiring transfer to SDU. Cardiology was consulted.  Assessment and Plan:   Principal Problem:  Acute on chronic anemia secondary to GI bleeding in the setting of newly diagnosed colon cancer  Pt initially admitted to SDU. She has received 2 units PRBC transfusion with post-transfusion Hgb 9.5. Hgb remains stable at 8.5 - 9.5 Status post colonoscopy 01/10/2014 with findings of cecal mass and multiple polyps. Pt underwent laparoscopic-assisted right colectomy and transanal excision of rectal polyp 01/12/2014. Due to concern for possible difficult extubation she was transferred to SDU after surgery. She was doing quite good after surgery and has had no complications since.  Appreciate surgery and GI following.  Staging CT of abdomen and pelvis done 01/10/14, no evidence of  mets. Had CT chest done already on 01/01/14, so would not repeat. Active Problems:  New onset atrial fibrillation  Patient was scheduled for endoscopy however procedure not done because patient went into atrial fibrillation. She was not hypoxic but was little short of breath this morning. Her oxygen saturation was 98% on 2 L nasal cannula oxygen support.   Order placed for 12-lead EKG. Order for small dose of Cardizem 5 mg IV placed. Cardiology consulted.  Order placed for 2-D echo. Hypertension Continue Norvasc 5 mg daily. Her blood pressure is 122/99 this am. 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  EGD sch for today but not done as pt went into A fib. Was on protonix drip until prior to surgery  Continue Diflucan 100 mg daily COPD (chronic obstructive pulmonary disease)  Stable, pulmonary is following n  Was little short of breath 6/14 but her CXR did not reveal acute findings. We decreased the rate of IV fluids from 100 to 50 to 20 cc/hr. This am also slightly short of breath but not in respiratory distress. GERD (gastroesophageal reflux disease)  Protonix drip stopped prior to surgery 01/12/2014 Type II or unspecified type diabetes mellitus without mention of complication, uncontrolled  Continue insulin sensitive sliding scale.  CBG's in past 24 hours: 303, 88, 156   DVT prophylaxis: SCD's bilaterally; heparin subQ   Code Status: full code  Family Communication: plan of care discussed with the patient and with her family at the bedside on  daily basis Disposition Plan: Transfer to step down unit from endo suite  Consultants:  Surgery (Dr. Zella Richer)  GI (Dr. Deatra Ina)  Pulmonary ([place]) Cardiology - consulted 01/16/2014 Procedures:  EGD 01/09/14: Tight peptic stricture in  the distal esophagus, s/p balloon dilation to 10 mm; Candida esophagitis.  Colonoscopy 01/10/14: 1. Bleeding malignant tumor/mass at the ileocecal valve; multiple biopsies were performed 2. Sessile polyp measuring 2-3 cm in size was found at the hepatic flexure - not removed 3. Sessile polyp measuring 6 mm in size was found at the hepatic flexure - not removed 4. Two sessile polyps measuring 3 and 4 mm in size were found in the transverse colon; polypectomy was performed 5. Sessile polyp measuring 3 mm in size was found in the descending colon; polypectomy was performed 6. Sessile polyp measuring 2 cm in size was found in the rectum; multiple biopsies were performed - not removed 7. There was severe diverticulosis noted in the sigmoid colon  Laparoscopic-assisted right colectomy. 2. Transanal excision of rectal polyp  01/12/2014 Antibiotics:  Cefotetan pre-operatively  Code Status: full code  Family Communication: plan of care discussed with the patient Disposition Plan: home when stable   [name], MD  [facility] Pager [phone]  If 7PM-7AM, please contact night-coverage [email or web address] Password [facility] 01/16/2014, 9:14 AM   LOS: 8 days    HPI/Subjective: No acute overnight events. Little short of breath this am but not in respiratory distress.  She said every time she gets up from the bed she feels little exerted.  Objective: Filed Vitals:   01/15/14 1538 01/15/14 1939 01/15/14 2112 01/16/14 0628  BP:   121/63 127/99  Pulse:  94 106 90  Temp:   98.5 F (36.9 C) 98.3 F (36.8 C)  TempSrc:   Oral Oral  Resp:  18 20 20   Height:      Weight:      SpO2: 93% 91% 93% 98%    Intake/Output Summary (Last 24 hours) at 01/16/14 0914 Last data filed at 01/16/14 0500  Gross per 24 hour  Intake    360 ml  Output    500 ml  Net   -140 ml    Exam:   General:  Pt is alert, follows commands appropriately  Cardiovascular: tachycardic , S1/S2 appreciated   Respiratory: short of  breath but no wheezing or crackles  Abdomen: Soft, non tender, non distended, bowel sounds present  Extremities: No edema, pulses DP and PT palpable bilaterally  Neuro: Grossly nonfocal  Data Reviewed: Basic Metabolic Panel:  Recent Labs Lab 01/10/14 0353 01/11/14 0320 01/13/14 0313 01/14/14 0555  NA 142 139 136* 137  K 3.8 3.4* 4.2 3.7  CL 103 102 101 104  CO2 28 26 24 22   GLUCOSE 97 174* 263* 193*  BUN 10 9 13 12   CREATININE 0.98 1.02 0.98 0.95  CALCIUM 8.6 8.8 8.7 8.3*   Liver Function Tests: No results found for this basename: AST, ALT, ALKPHOS, BILITOT, PROT, ALBUMIN,  in the last 168 hours No results found for this basename: LIPASE, AMYLASE,  in the last 168 hours No results found for this basename: AMMONIA,  in the last 168 hours CBC:  Recent Labs Lab 01/10/14 0353 01/11/14 0320 01/12/14 0327 01/13/14 0313 01/14/14 0555  WBC 13.2* 12.7* 8.6 30.0* 14.8*  NEUTROABS  --   --   --   --  13.3*  HGB 9.9* 9.5* 9.2* 9.2* 8.5*  HCT 30.8* 29.7* 29.1* 29.4* 26.7*  MCV 78.4 78.2 80.4  81.4 82.2  PLT 329 312 290 292 229   Cardiac Enzymes: No results found for this basename: CKTOTAL, CKMB, CKMBINDEX, TROPONINI,  in the last 168 hours BNP: No components found with this basename: POCBNP,  CBG:  Recent Labs Lab 01/14/14 0806 01/15/14 0755 01/15/14 1213 01/15/14 1637 01/15/14 2101  GLUCAP 177* 119* 303* 88 156*    Recent Results (from the past 240 hour(s))  MRSA PCR SCREENING     Status: None   Collection Time    01/08/14  4:26 PM      Result Value Ref Range Status   MRSA by PCR NEGATIVE  NEGATIVE Final   Comment:            The GeneXpert MRSA Assay (FDA     approved for NASAL specimens     only), is one component of a     comprehensive MRSA colonization     surveillance program. It is not     intended to diagnose MRSA     infection nor to guide or     monitor treatment for     MRSA infections.  URINE CULTURE     Status: None   Collection Time     01/08/14  5:28 PM      Result Value Ref Range Status   Specimen Description URINE, CLEAN CATCH   Final   Special Requests NONE   Final   Culture  Setup Time     Final   Value: 01/08/2014 22:44     Performed at [facility] Count     Final   Value: NO GROWTH     Performed at [facility]   Culture     Final   Value: NO GROWTH     Performed at [facility]   Report Status 01/09/2014 FINAL   Final  SURGICAL PCR SCREEN     Status: None   Collection Time    01/12/14  7:23 AM      Result Value Ref Range Status   MRSA, PCR NEGATIVE  NEGATIVE Final   Staphylococcus aureus NEGATIVE  NEGATIVE Final   Comment:            The Xpert SA Assay (FDA     approved for NASAL specimens     in patients over 14 years of age),     is one component of     a comprehensive surveillance     program.  Test performance has     been validated by [employer] for patients greater     than or equal to 71 year old.     It is not intended     to diagnose infection nor to     guide or monitor treatment.     Studies: No results found.  Scheduled Meds: . alvimopan  12 mg Oral BID  . antiseptic oral rinse  15 mL Mouth Rinse q12n4p  . budesonide (PULMICORT) nebulizer solution  0.25 mg Nebulization Q12H  . chlorhexidine  15 mL Mouth Rinse BID  . docusate sodium  100 mg Oral BID  . fluconazole  100 mg Oral Daily  . heparin subcutaneous  5,000 Units Subcutaneous 3 times per day  . insulin aspart  0-5 Units Subcutaneous QHS  . insulin aspart  0-9 Units Subcutaneous TID WC  . ipratropium-albuterol  3 mL Nebulization QID  . pantoprazole (PROTONIX) IV  40 mg Intravenous Q12H  . polyethylene  glycol  17 g Oral BID   Continuous Infusions: . dextrose 5 % and 0.9% NaCl 50 mL/hr at 01/14/14 [phone]

## 2014-01-16 NOTE — Progress Notes (Signed)
Patient went into Afib with RVR - stable BP. Will cancel EGD Transfer to SDU Cardiology will be consulted 12 lead EKG Discused w/ anesthesia and Dr. Charlies Silvers  Note - no CP, SOB.

## 2014-01-16 NOTE — Consult Note (Addendum)
Admit date: 01/08/2014 Referring Physician  Dr. Charlies Silvers Primary Physician  Dr. Tamsen Roers Primary Cardiologist : NONE Reason for Consultation  afib with RVR  HPI: Patient is a 73 year old female with history of COPD emphysema, hypertension, type 2 diabetes mellitus, GERD, hyperlipidemia presented to the ER with anemia. Patient was complaining of increasing fatigue, generalized weakness, increasing shortness of breath in the last 4 weeks. Patient was seen in the ED a week ago for dyspnea and had a negative CT angiogram of the chest for PE. Patient was treated for COPD exacerbation. Patient was found to have Low hemoglobin of 7.5, however the fecal occult test was negative. Patient was started on iron supplementation and was DC'd home. Patient saw her PCP on 6/8 and was sent to the ER for low hemoglobin, hb 6.5, hematocrit 21.1 in the ED. Patient has noticed black stools, attributed to iron supplementation however a stool occult test is positive in the ED. Patient denies any excessive NSAID use. She has noticed difficulty swallowing in the last 1 month. She has lost about 10 lbs from not eating enough due to dysphagia. Patient was seen by Dr. Carlean Purl on 5/28 and recommended EGD, per patient scheduled on on 01/10/14.  She was found to have a cecal mass on colonoscopy and was confirmed to be colon CA.  She underwent laparoscopic assisted right colectomy and transanal excision of rectal poyp 6/12.  She was scheduled to have an EGD today for dysphagia and prior to the procedure went into afib with RVR and was given 5mg  IV Cardizem.  Cardiology is now asked to evaluate.     PMH:   Past Medical History  Diagnosis Date  . Emphysema lung   . Hyperlipidemia   . GERD (gastroesophageal reflux disease)   . Vitamin D deficiency   . Closed fracture of unspecified part of upper end of humerus   . Complete rupture of rotator cuff   . Other vitamin B12 deficiency anemia   . Neurogenic bladder, NOS   . COPD  (chronic obstructive pulmonary disease)   . Type II or unspecified type diabetes mellitus without mention of complication, uncontrolled     diet controlled  . Essential hypertension, benign     off lisinporil for last 2 months  . Esophageal stricture 12/28/2013     PSH:   Past Surgical History  Procedure Laterality Date  . Orif shoulder fracture Right 2011    arthroplasty  . Tonsillectomy and adenoidectomy  age 42 or 72  . Eye surgery Bilateral 2010    both eyes lens replacments  . Esophagogastroduodenoscopy (egd) with propofol N/A 01/09/2014    Procedure: ESOPHAGOGASTRODUODENOSCOPY (EGD) WITH PROPOFOL;  Surgeon: [name], MD;  Location: [facility] ENDOSCOPY;  Service: Endoscopy;  Laterality: N/A;  . Colonoscopy N/A 01/10/2014    Procedure: COLONOSCOPY;  Surgeon: [name], MD;  Location: [facility] ENDOSCOPY;  Service: Endoscopy;  Laterality: N/A;    Allergies:  Review of patient's allergies indicates no known allergies. Prior to Admit Meds:   Prescriptions prior to admission  Medication Sig Dispense Refill  . albuterol (PROAIR HFA) 108 (90 BASE) MCG/ACT inhaler Inhale 2 puffs into the lungs 4 (four) times daily.      Marland Kitchen albuterol (PROVENTIL) (2.5 MG/3ML) 0.083% nebulizer solution Take 2.5 mg by nebulization 4 (four) times daily.      . ferrous sulfate 325 (65 FE) MG tablet Take 1 tablet (325 mg total) by mouth daily.  30 tablet  0  . mometasone-formoterol (DULERA)  100-5 MCG/ACT AERO Inhale 1 puff into the lungs 2 (two) times daily.      Marland Kitchen omeprazole (PRILOSEC) 40 MG capsule Take 40 mg by mouth every morning.       . predniSONE (DELTASONE) 10 MG tablet Take 60mg  daily for 3 days, then take 40mg  daily for three days, then take 20mg  daily for three days, then take 10mg  daily for three days  45 tablet  0   Fam HX:    Family History  Problem Relation Age of Onset  . Diabetes Father   . Bone cancer Father     bone marrow  . Emphysema Mother   . Pneumonia Mother    Social HX:    History    Social History  . Marital Status: Divorced    Spouse Name: N/A    Number of Children: 66  . Years of Education: N/A   Occupational History  . retired    Social History Main Topics  . Smoking status: Former Smoker -- 1.00 packs/day for 20 years    Types: Cigarettes    Quit date: 08/03/2005  . Smokeless tobacco: Never Used  . Alcohol Use: No  . Drug Use: No  . Sexual Activity: No   Other Topics Concern  . 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 2 caffeinated beverages daily.   Lives in [place].     ROS:  All 11 ROS were addressed and are negative except what is stated in the HPI  Physical Exam: Blood pressure 167/76, pulse 96, temperature 97.7 F (36.5 C), temperature source Oral, resp. rate 17, height 5\' 1"  (1.549 m), weight 109 lb 4.8 oz (49.578 kg), SpO2 99.00%.    General: Well developed, well nourished, in no acute distress Head: Eyes PERRLA, No xanthomas.   Normal cephalic and atramatic  Lungs:   Clear bilaterally to auscultation and percussion. Heart:   HRRR S1 S2 Pulses are 2+ & equal. Occasional ectopy            No carotid bruit. No JVD.  No abdominal bruits. No femoral bruits. Abdomen: Bowel sounds are positive, abdomen soft and non-tender without masses Extremities:   No clubbing, cyanosis or edema.  DP +1 Neuro: Alert and oriented X 3. Psych:  Good affect, responds appropriately   Labs:   Lab Results  Component Value Date   WBC 7.2 01/16/2014   HGB 9.5* 01/16/2014   HCT 29.9* 01/16/2014   MCV 81.0 01/16/2014   PLT 240 01/16/2014    Recent Labs Lab 01/16/14 1010  NA 137  K 3.2*  CL 100  CO2 27  BUN 5*  CREATININE 0.70  CALCIUM 8.6  GLUCOSE 195*   No results found for this basename: PTT   Lab Results  Component Value Date   INR 0.89 01/08/2014   INR 0.90 07/15/2010   No results found for this basename: CKTOTAL, CKMB, CKMBINDEX, TROPONINI     Lab Results  Component Value Date    CHOL  Value: 120        ATP III CLASSIFICATION:  <200     mg/dL   Desirable  200-239  mg/dL   Borderline High  >=240    mg/dL   High        07/17/2010   Lab Results  Component Value Date   HDL 46 07/17/2010   Lab Results  Component Value Date   LDLCALC  Value: 61  Total Cholesterol/HDL:CHD Risk Coronary Heart Disease Risk Table                     Men   Women  1/2 Average Risk   3.4   3.3  Average Risk       5.0   4.4  2 X Average Risk   9.6   7.1  3 X Average Risk  23.4   11.0        Use the calculated Patient Ratio above and the CHD Risk Table to determine the patient's CHD Risk.        ATP III CLASSIFICATION (LDL):  <100     mg/dL   Optimal  100-129  mg/dL   Near or Above                    Optimal  130-159  mg/dL   Borderline  160-189  mg/dL   High  >190     mg/dL   Very High 07/17/2010   Lab Results  Component Value Date   TRIG 67 07/17/2010   Lab Results  Component Value Date   CHOLHDL 2.6 07/17/2010   No results found for this basename: LDLDIRECT      Radiology:  No results found.  EKG:  #1 atrial fibrillation with RVR at 140-150bpm            #2 NSR with PAC's  ASSESSMENT:  1.  New onset afib with RVR before EGD.  Currently HR in the 90's after Cardizem 5mg  IV.  She has never had afib before or palpitations. 2.  HTN with elevated BP 3.  Dysphagia secondary to esophageal stricture 4.  COPD with chronic DOE 5.  Acute on chronic anemia secondary to GI bleeding in setting of newly diagnosed colon CA 6.  Hypokalemia  PLAN:   1. Add Lopressor 25mg  BID for rate control 2. Check TSH 3. Check 2D echo to evaluate LVF and LA size.  4. Her CHADS2VASC score is 4 (female -1, HTN - 1, age>75 -2) but this is the first incidence.  Would hold on anticoagulation for now given recent colon CA surgery and anemia.  If she continues in afib longer than 24 hours may need to reevaluate.  She appears to be going in and out of PAF at this time and hopefully will settle down with  lopressor. 5.  Replete potassium and try to keep >4  Sueanne Margarita, MD  01/16/2014  1:27 PM

## 2014-01-16 NOTE — Transfer of Care (Signed)
Immediate Anesthesia Transfer of Care Note  Patient: Amanda [name]  Procedure(s) Performed: Procedure(s): ESOPHAGOGASTRODUODENOSCOPY (EGD) (N/A)  Patient Location: PACU and Endoscopy Unit  Anesthesia Type:MAC  Level of Consciousness: awake, alert , oriented and patient cooperative  Airway & Oxygen Therapy: Patient connected to nasal cannula oxygen  Post-op Assessment: Report given to PACU RN and Post -op Vital signs reviewed and stable  Post vital signs: Reviewed and stable  Complications: No apparent anesthesia complications

## 2014-01-16 NOTE — Anesthesia Preprocedure Evaluation (Signed)
Anesthesia Evaluation  Patient identified by MRN, date of birth, ID band Patient awake  General Assessment Comment: Emphysema lung     .  Hyperlipidemia     .  GERD (gastroesophageal reflux disease)     .  Vitamin D deficiency     .  Closed fracture of unspecified part of upper end of humerus     .  Complete rupture of rotator cuff     .  Other vitamin B12 deficiency anemia     .  Neurogenic bladder, NOS     .  COPD (chronic obstructive pulmonary disease)     .  Type II or unspecified type diabetes mellitus without mention of complication, uncontrolled         diet controlled   .  Essential hypertension, benign         off lisinporil for last 2 months     Reviewed: Allergy & Precautions, H&P , NPO status , Patient's Chart, lab work & pertinent test results  Airway Mallampati: II TM Distance: >3 FB Neck ROM: Full    Dental no notable dental hx. (+) Edentulous Upper, Edentulous Lower   Pulmonary COPD COPD inhaler, former smoker,  breath sounds clear to auscultation  Pulmonary exam normal       Cardiovascular Exercise Tolerance: Good hypertension, Rhythm:Regular Rate:Normal     Neuro/Psych negative neurological ROS  negative psych ROS   GI/Hepatic Neg liver ROS, GERD-  Medicated,  Endo/Other  diabetes, Type 2  Renal/GU negative Renal ROS  negative genitourinary   Musculoskeletal negative musculoskeletal ROS (+)   Abdominal   Peds negative pediatric ROS (+)  Hematology  (+) anemia , Hgb 9.2   Anesthesia Other Findings   Reproductive/Obstetrics negative OB ROS                           Anesthesia Physical Anesthesia Plan  ASA: III  Anesthesia Plan: MAC   Post-op Pain Management:    Induction: Intravenous  Airway Management Planned: Nasal Cannula  Additional Equipment:   Intra-op Plan:   Post-operative Plan: Extubation in OR  Informed Consent: I have reviewed the patients  History and Physical, chart, labs and discussed the procedure including the risks, benefits and alternatives for the proposed anesthesia with the patient or authorized representative who has indicated his/her understanding and acceptance.   Dental advisory given  Plan Discussed with: CRNA  Anesthesia Plan Comments:         Anesthesia Quick Evaluation

## 2014-01-16 NOTE — Progress Notes (Signed)
Patient noted to have sudden onset of SVT with rapid ventricular response upon attachment of monitors in endoscopy suite. Case cancelled and cardiology consult will be obtained.

## 2014-01-17 ENCOUNTER — Encounter (HOSPITAL_COMMUNITY): Payer: Self-pay | Admitting: Internal Medicine

## 2014-01-17 DIAGNOSIS — I059 Rheumatic mitral valve disease, unspecified: Secondary | ICD-10-CM

## 2014-01-17 LAB — GLUCOSE, CAPILLARY
GLUCOSE-CAPILLARY: 164 mg/dL — AB (ref 70–99)
GLUCOSE-CAPILLARY: 133 mg/dL — AB (ref 70–99)
GLUCOSE-CAPILLARY: 130 mg/dL — AB (ref 70–99)
GLUCOSE-CAPILLARY: 133 mg/dL — AB (ref 70–99)
Glucose-Capillary: 156 mg/dL — ABNORMAL HIGH (ref 70–99)
Glucose-Capillary: 103 mg/dL — ABNORMAL HIGH (ref 70–99)
Glucose-Capillary: 158 mg/dL — ABNORMAL HIGH (ref 70–99)
Glucose-Capillary: 175 mg/dL — ABNORMAL HIGH (ref 70–99)
Glucose-Capillary: 181 mg/dL — ABNORMAL HIGH (ref 70–99)
Glucose-Capillary: 187 mg/dL — ABNORMAL HIGH (ref 70–99)

## 2014-01-17 LAB — BASIC METABOLIC PANEL
BUN: 3 mg/dL — ABNORMAL LOW (ref 6–23)
CO2: 27 mEq/L (ref 19–32)
Calcium: 8.6 mg/dL (ref 8.4–10.5)
Chloride: 102 mEq/L (ref 96–112)
Creatinine, Ser: 0.75 mg/dL (ref 0.50–1.10)
GFR calc Af Amer: >90 mL/min (ref >90)
GFR, EST NON AFRICAN AMERICAN: 81 mL/min — AB (ref >90)
Glucose, Bld: 162 mg/dL — ABNORMAL HIGH (ref 70–99)
Potassium: 3.7 mEq/L (ref 3.7–5.3)
SODIUM: 138 meq/L (ref 137–147)

## 2014-01-17 MED ORDER — ARFORMOTEROL TARTRATE 15 MCG/2ML IN NEBU
15.0000 ug | INHALATION_SOLUTION | Freq: Two times a day (BID) | RESPIRATORY_TRACT | Status: DC
  Administered 2014-01-17 (×2): 15 ug via RESPIRATORY_TRACT
  Filled 2014-01-17 (×3): qty 2

## 2014-01-17 MED ORDER — BOOST / RESOURCE BREEZE PO LIQD
1.0000 | Freq: Three times a day (TID) | ORAL | Status: DC
  Administered 2014-01-17 – 2014-01-20 (×4): 1 via ORAL
  Administered 2014-01-21: 0.9875 via ORAL
  Administered 2014-01-21: 1 via ORAL

## 2014-01-17 NOTE — Progress Notes (Signed)
NUTRITION FOLLOW UP  Intervention:   -Clear liquid diet -Diet advancement per MD -Resource Breeze TID as tolerated -RD to continue to monitor  Nutrition Dx:   Inadequate oral intake related to clear liquid diet as evidenced by diet order; ongoing.   Goal:  Advance diet as tolerated to diabetic diet-not met   Monitor:   Diet order, total protein/energy intake, labs, weight, GI profile, swallow profile  Assessment:   6/09: - Pt and family report pt eats 4-6 small well balanced meals/day at home  - Pt c/o dysphagia x 15 years with 10 pound unintended weight loss in the past month  - Pt reports 2-3 times/week food will get stuck in the bottom of her throat and she will have to vomit it back up. States the worst is cornbread and dry bread.  - Not on any nutritional supplements at home  - Eating a popsicle during visit, did not perform nutrition focused physical exam    6/16: -Pt newly dx colon cancer, s/p colectomy on 6/12 d/t cecal mass and multiple polyps per MD -Tolerating clear liquid diet, consuming 65-75%. Received Resource Breeze on 6/10-6/11, then was cancelled d/t NPO status. Will continue to recommend as diet advancement tolerated -Receiving Diflucan for esophagitis candidiasis -Was NPO for EGD r/t dysphagia. EGD was cancelled when pt went into Afib w/RVR per MD  6/17: -Tolerating clear liquid diet -Complains of difficulty swallowing solids and would like to repeat dilation while here. -Add [employer] while on clear liquid diet.   Height: Ht Readings from Last 1 Encounters:  01/13/14 5' 1"  (1.549 m)    Weight Status:   Wt Readings from Last 1 Encounters:  01/13/14 109 lb 4.8 oz (49.578 kg)    Re-estimated needs:  Kcal: 1250-1450  Protein: 50-60g  Fluid: 1.2-1.4L/day   Skin: surgical incision on abdomen  Diet Order: Clear Liquid   Intake/Output Summary (Last 24 hours) at 01/17/14 1242 Last data filed at 01/17/14 1000  Gross per 24 hour  Intake    1280 ml  Output   1500 ml  Net   -220 ml    Last BM: 6/09, flatus on 6/15   Labs:   Recent Labs Lab 01/16/14 1010 01/16/14 1455 01/17/14 0814  NA 137 140 138  K 3.2* 3.0* 3.7  CL 100 103 102  CO2 27 29 27   BUN 5* 4* 3*  CREATININE 0.70 0.67 0.75  CALCIUM 8.6 8.7 8.6  GLUCOSE 195* 148* 162*    CBG (last 3)   Recent Labs  01/16/14 1528 01/16/14 2140 01/17/14 0837  GLUCAP 137* 171* 164*    Scheduled Meds: . amLODipine  5 mg Oral Daily  . antiseptic oral rinse  15 mL Mouth Rinse q12n4p  . arformoterol  15 mcg Nebulization Q12H  . budesonide (PULMICORT) nebulizer solution  0.25 mg Nebulization Q12H  . chlorhexidine  15 mL Mouth Rinse BID  . docusate sodium  100 mg Oral BID  . fluconazole  100 mg Oral Daily  . heparin subcutaneous  5,000 Units Subcutaneous 3 times per day  . insulin aspart  0-5 Units Subcutaneous QHS  . insulin aspart  0-9 Units Subcutaneous TID WC  . metoprolol tartrate  25 mg Oral BID  . pantoprazole (PROTONIX) IV  40 mg Intravenous Q12H  . polyethylene glycol  17 g Oral BID    Continuous Infusions: . dextrose 5 % and 0.9% NaCl 50 mL/hr at 01/16/14 [place], RD, LDN Clinical Inpatient Dietitian  Pager:  [phone] Weekend and after hours pager:  [phone]

## 2014-01-17 NOTE — Evaluation (Signed)
Occupational Therapy Evaluation Patient Details Name: Amanda [name] MRN: [id number] DOB: 1941/04/26 Today's Date: 01/17/2014    History of Present Illness Pt admitted with colon CA and underwent a R hemicolectomy.  Pt with new onset afib on 6/16 and was converted back to normal rythm.  Pt with PMH of emphysema, Gerd, R humerous fx w surgery and full rotator cuff rupture and COPD.   Clinical Impression   Pt admitted with the above diagnosis and had the deficits listed below. Pt would benefit from cont OT to increase I and efficiency with adls so she can possibly d/c straight home with husband.  Pt does well with adls but her endurance is poor at this time.      Follow Up Recommendations  SNF;Supervision/Assistance - 24 hour;Other (comment) (pt may be able to go home with HHOT dep on progress.)    Equipment Recommendations  None recommended by OT    Recommendations for Other Services       Precautions / Restrictions Precautions Precautions: Fall Restrictions Weight Bearing Restrictions: No      Mobility Bed Mobility Overal bed mobility: Needs Assistance Bed Mobility: Supine to Sit     Supine to sit: Min assist     General bed mobility comments: Pt did well with bed mobilty.  HOB at 30 degrees.  Transfers Overall transfer level: Needs assistance Equipment used: 1 person hand held assist Transfers: Stand Pivot Transfers;Sit to/from Stand Sit to Stand: Min guard Stand pivot transfers: Min assist            Balance Overall balance assessment: Needs assistance Sitting-balance support: No upper extremity supported;Feet supported Sitting balance-Leahy Scale: Good     Standing balance support: Bilateral upper extremity supported;During functional activity Standing balance-Leahy Scale: Fair Standing balance comment: Pt stood for appx 4 minutes holding to therapist at times but was able to let go and stand on own for 1 minute.  No challenges given.                             ADL Overall ADL's : Needs assistance/impaired Eating/Feeding: Set up;Sitting Eating/Feeding Details (indicate cue type and reason): pt uses L hand due to painful R shoulder (long standing) Grooming: Wash/dry hands;Wash/dry face;Oral care;Brushing hair;Sitting;Set up Grooming Details (indicate cue type and reason): uses L hand for most tasks.  R just to assist. Upper Body Bathing: Set up;Sitting   Lower Body Bathing: Minimal assistance;Sit to/from stand Lower Body Bathing Details (indicate cue type and reason): Pt requires steading assist when standing to wash bottom Upper Body Dressing : Set up;Sitting   Lower Body Dressing: Minimal assistance;Sit to/from stand Lower Body Dressing Details (indicate cue type and reason): min assist when on feet pulling pants up. Toilet Transfer: Minimal Production assistant, radio Details (indicate cue type and reason): assist for lines and just to steady pt. Toileting- Clothing Manipulation and Hygiene: Minimal assistance;Sit to/from stand Toileting - Clothing Manipulation Details (indicate cue type and reason): Pt able to pull pants up with assist to steady self w/o walker     Functional mobility during ADLs: Minimal assistance General ADL Comments: Pt overall does very well with adls.  Pt becomes fatigued quickly and needs rest breaks.     Vision                     Perception Perception Perception Tested?: No   Praxis      Pertinent Vitals/Pain Pt with BP  of 147/46.  O2 in 90s.  HR stable during session.  Pt only reports pain with ROM to R shoulder which is not new.     Hand Dominance Right   Extremity/Trunk Assessment Upper Extremity Assessment Upper Extremity Assessment: RUE deficits/detail RUE Deficits / Details: Pt is WFL except for R shoulder.  Pt had humerous fx in past wtih rotator cuff rupture and basically tolerates no ROM to R shoulder. Pt was R handed and now is L handed. RUE: Unable to  fully assess due to pain RUE Coordination: decreased gross motor   Lower Extremity Assessment Lower Extremity Assessment: Defer to PT evaluation       Communication Communication Communication: No difficulties   Cognition Arousal/Alertness: Awake/alert Behavior During Therapy: WFL for tasks assessed/performed Overall Cognitive Status: Within Functional Limits for tasks assessed                     General Comments       Exercises       Shoulder Instructions      Home Living Family/patient expects to be discharged to:: Private residence Living Arrangements: Spouse/significant other Available Help at Discharge: Family;Available 24 hours/day Type of Home: House Home Access: Stairs to enter [employer] of Steps: 3 Entrance Stairs-Rails: Left Home Layout: One level     Bathroom Shower/Tub: Tub/shower unit;Walk-in shower;Door Shower/tub characteristics: Door Biochemist, clinical: Standard     Home Equipment: Clinical cytogeneticist - 2 wheels;Bedside commode   Additional Comments: Pt has walk in shower and tub shower.  Walk in has built in seat.      Prior Functioning/Environment Level of Independence: Independent             OT Diagnosis: Generalized weakness;Acute pain   OT Problem List: Decreased strength;Decreased activity tolerance;Impaired balance (sitting and/or standing);Decreased knowledge of use of DME or AE;Impaired UE functional use   OT Treatment/Interventions: Self-care/ADL training;Therapeutic activities;Energy conservation    OT Goals(Current goals can be found in the care plan section) Acute Rehab OT Goals Patient Stated Goal: to go home and not to a SNF OT Goal Formulation: With patient Time For Goal Achievement: 01/31/14 Potential to Achieve Goals: Good ADL Goals Pt Will Perform Grooming: with modified independence;standing Pt Will Perform Lower Body Bathing: with supervision;sit to/from stand Pt Will Perform Lower Body  Dressing: with supervision;sit to/from stand Pt Will Perform Tub/Shower Transfer: with supervision;ambulating;shower seat;Shower transfer;rolling walker Additional ADL Goal #1: Pt will complete all toileting with 3:1 over top of commode with S Additional ADL Goal #2: Pt will state 3 energy conservation techniques she can implement at home to save energy during adls.  OT Frequency: Min 2X/week   Barriers to D/C:    very supportive family with husband and children available to assist.       Co-evaluation              End of Session Nurse Communication: Mobility status  Activity Tolerance: Patient limited by fatigue Patient left: in chair;with call bell/phone within reach;with family/visitor present   Time: 0932-1002 OT Time Calculation (min): 30 min Charges:  OT General Charges $OT Visit: 1 Procedure OT Evaluation $Initial OT Evaluation Tier I: 1 Procedure OT Treatments $Self Care/Home Management : 23-37 mins G-Codes:    Glenford Peers 2014/02/03, 10:15 AM [phone]

## 2014-01-17 NOTE — Progress Notes (Signed)
Patient ID: Amanda [name], female   DOB: 1941-07-28, 73 y.o.   MRN: [id number] 1 Day Post-Op  Subjective: Pt feels well this morning.  Had BM yesterday.  No nausea.  hungry  Objective: Vital signs in last 24 hours: Temp:  [97.6 F (36.4 C)-98.5 F (36.9 C)] 98.5 F (36.9 C) (06/17 0400) Pulse Rate:  [63-105] 63 (06/17 0600) Resp:  [13-25] 18 (06/17 0600) BP: (119-190)/(38-104) 129/55 mmHg (06/17 0600) SpO2:  [92 %-100 %] 100 % (06/17 0600) Last BM Date: 01/16/14  Intake/Output from previous day: 06/16 0701 - 06/17 0700 In: 1330 [P.O.:480; I.V.:750; IV Piggyback:100] Out: 1975 [Urine:1975] Intake/Output this shift:    PE: Abd: soft, appropriately tender, +BS, ND, incision c/d/i with staples  Lab Results:   Recent Labs  01/16/14 1010  WBC 7.2  HGB 9.5*  HCT 29.9*  PLT 240   BMET  Recent Labs  01/16/14 1010 01/16/14 1455  NA 137 140  K 3.2* 3.0*  CL 100 103  CO2 27 29  GLUCOSE 195* 148*  BUN 5* 4*  CREATININE 0.70 0.67  CALCIUM 8.6 8.7   PT/INR No results found for this basename: LABPROT, INR,  in the last 72 hours CMP     Component Value Date/Time   NA 140 01/16/2014 1455   K 3.0* 01/16/2014 1455   CL 103 01/16/2014 1455   CO2 29 01/16/2014 1455   GLUCOSE 148* 01/16/2014 1455   BUN 4* 01/16/2014 1455   CREATININE 0.67 01/16/2014 1455   CALCIUM 8.7 01/16/2014 1455   PROT 6.3 01/08/2014 1311   ALBUMIN 3.4* 01/08/2014 1311   AST 15 01/08/2014 1311   ALT 12 01/08/2014 1311   ALKPHOS 55 01/08/2014 1311   BILITOT 0.3 01/08/2014 1311   GFRNONAA 84* 01/16/2014 1455   GFRAA >90 01/16/2014 1455   Lipase     Component Value Date/Time   LIPASE 29 07/15/2010 2105       Studies/Results: No results found.  Anti-infectives: Anti-infectives   Start     Dose/Rate Route Frequency Ordered Stop   01/12/14 2200  cefoTEtan (CEFOTAN) 2 g in dextrose 5 % 50 mL IVPB     2 g 100 mL/hr over 30 Minutes Intravenous Every 12 hours 01/12/14 1617 01/12/14 2249   01/12/14 2000   cefoTEtan (CEFOTAN) 1 g in dextrose 5 % 50 mL IVPB  Status:  Discontinued     1 g 100 mL/hr over 30 Minutes Intravenous Every 12 hours 01/11/14 1511 01/12/14 1617   01/12/14 0900  cefoTEtan (CEFOTAN) 1 g in dextrose 5 % 50 mL IVPB  Status:  Discontinued     1 g 100 mL/hr over 30 Minutes Intravenous Every 12 hours 01/11/14 1459 01/11/14 1511   01/12/14 0800  cefoTEtan (CEFOTAN) 2 g in dextrose 5 % 50 mL IVPB     2 g 100 mL/hr over 30 Minutes Intravenous On call 01/12/14 0713 01/12/14 1200   01/09/14 1600  fluconazole (DIFLUCAN) 40 MG/ML suspension 100 mg     100 mg Oral Daily 01/09/14 1333         Assessment/Plan  1. POD 5, s/p lap assisted right hemicolectomy and transanal excision of benign polyp  2. adenoca of colon, surgical path pending  3. COPD 4. New onset A fib yesterday  Plan: 1. May advance diet to full liquids after endo able to be performed.  Patient doing well surgically. 2. If anticoagulation eventually needed, it is ok from our standpoint. 3. Cont to mobilize and pulm  toilet  LOS: 9 days    OSBORNE,KELLY E 01/17/2014, 7:39 AM Pager: [phone]

## 2014-01-17 NOTE — Progress Notes (Signed)
  Echocardiogram 2D Echocardiogram has been performed.  Diamond Nickel 01/17/2014, 12:14 PM

## 2014-01-17 NOTE — Progress Notes (Signed)
     [facility] Progress Note  Subjective:  Feels well.  Had a large BM yesterday.  Abdomen just sore.  Objective:  Vital signs in last 24 hours: Temp:  [97.6 F (36.4 C)-98.5 F (36.9 C)] 98.5 F (36.9 C) (06/17 0400) Pulse Rate:  [63-105] 77 (06/17 0700) Resp:  [13-25] 17 (06/17 0700) BP: (119-190)/(38-104) 146/54 mmHg (06/17 0700) SpO2:  [92 %-100 %] 97 % (06/17 0828) Last BM Date: 01/16/14 General:  Alert, Well-developed, in NAD Heart:  Regular rate and rhythm; no murmurs Pulm:  Some end expiratory wheezing heard in right lung Abdomen:  Soft, non-distended. Normal bowel sounds.  Appropriately tender.  Extremities:  Without edema. Neurologic:  Alert and  oriented x4;  grossly normal neurologically. Psych:  Alert and cooperative. Normal mood and affect.  Intake/Output from previous day: 06/16 0701 - 06/17 0700 In: 1330 [P.O.:480; I.V.:750; IV Piggyback:100] Out: 1975 [Urine:1975]  Lab Results:  Recent Labs  01/16/14 1010  WBC 7.2  HGB 9.5*  HCT 29.9*  PLT 240   BMET  Recent Labs  01/16/14 1010 01/16/14 1455 01/17/14 0814  NA 137 140 138  K 3.2* 3.0* PENDING  CL 100 103 102  CO2 27 29 27   GLUCOSE 195* 148* 162*  BUN 5* 4* 3*  CREATININE 0.70 0.67 0.75  CALCIUM 8.6 8.7 8.6   Assessment / Plan: 1. Colon cancer (cecum). No metastatic disease on CT scan. POD #5 s/p lap assisted right hemicolectomy. 2. Hepatic flexure polyp and rectal polyp: Removed with hemicolectomy and transanal excision, respectively (were tubular adenomas). 3. Esophageal stricture, s/p dilation on 6/9. Stricture still tight post dilation would like to repeat EGD with dilation while she is here this week if possible. 4. Candida esophagitis, getting Diflucan  5. 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  6.  New onset Afib yesterday:  Converted back to sinus rhythm.  ECHO pending.  *Could possibly plan for EGD with dilation Friday if ECHO is ok and  cleared by cards.  Pulmonary CC is recommending propofol with LMA anesthesia support for EGD.    LOS: 9 days   ZEHR, JESSICA D.  01/17/2014, 9:13 AM  Pager number [phone]  [place] Attending  I have also seen and assessed the patient and agree with the above note. Will defer to anesthesia re: sedation/anesthesia and airways.  Will go ahead and order EGD dili forFri.  Gatha Mayer, MD, [facility] [phone] (pager) 01/17/2014 4:37 PM

## 2014-01-17 NOTE — Evaluation (Signed)
Physical Therapy Evaluation Patient Details Name: Amanda [name] MRN: [id number] DOB: [birth year] Today's Date: 01/17/2014   History of Present Illness  Pt admitted with colon CA and underwent a R hemicolectomy.  Pt with new onset afib on 6/16 and was converted back to normal rythm.  Pt with PMH of emphysema, Gerd, R humerous fx w surgery and full rotator cuff rupture and COPD.  Clinical Impression  On eval, pt required Min assist for mobility-able to ambulate ~250 feet while holding onto IV pole for support. Demonstrates general weakness, decreased activity tolerance, and impaired gait and balance. Recommend HHPT, 24 hour supervision/assist at this time. May need to consider ST rehab if progress with mobility is slow.     Follow Up Recommendations Home health PT;Supervision/Assistance - 24 hour (SNF possibly if progress is slow. )    Equipment Recommendations  None recommended by PT    Recommendations for Other Services OT consult     Precautions / Restrictions Precautions Precautions: Fall Restrictions Weight Bearing Restrictions: No      Mobility  Bed Mobility Overal bed mobility: Needs Assistance Bed Mobility: Supine to Sit;Sit to Supine     Supine to sit: Min guard;HOB elevated Sit to supine: Min assist;HOB elevated   General bed mobility comments: Assist for LEs onto bed. Increased time.   Transfers Overall transfer level: Needs assistance   Transfers: Sit to/from Stand Sit to Stand: Min guard         General transfer comment: close guard for safety  Ambulation/Gait Ambulation/Gait assistance: Min assist Ambulation Distance (Feet): 250 Feet Assistive device: None (pt held onto IV pole with 2 hands) Gait Pattern/deviations: Trunk flexed;Decreased stride length     General Gait Details: slow gait speed. dyspnea 2/4 with ambulation. May need to try RW on next visit.   Stairs            Wheelchair Mobility    Modified Rankin (Stroke Patients Only)        Balance   Sitting-balance support: No upper extremity supported Sitting balance-Leahy Scale: Good     Standing balance support: During functional activity Standing balance-Leahy Scale: Poor                               Pertinent Vitals/Pain Start of session: 64-73 bpm, 98% RA, 14 RR During: 77-84 bpm, 91% RA (dyspnea 2/4) End of session: 73 bpm, 97% RA, 15 RR    Home Living Family/patient expects to be discharged to:: Private residence Living Arrangements: Spouse/significant other Available Help at Discharge: Family;Available 24 hours/day Type of Home: House Home Access: Stairs to enter Entrance Stairs-Rails: Left Entrance Stairs-Number of Steps: 3 Home Layout: One level Home Equipment: Clinical cytogeneticist - 2 wheels;Bedside commode Additional Comments: Pt has walk in shower and tub shower.  Walk in has built in seat.    Prior Function Level of Independence: Independent               Hand Dominance   Dominant Hand: Right    Extremity/Trunk Assessment   Upper Extremity Assessment: Defer to OT evaluation           Lower Extremity Assessment: Generalized weakness      Cervical / Trunk Assessment: Kyphotic  Communication   Communication: No difficulties  Cognition Arousal/Alertness: Awake/alert Behavior During Therapy: WFL for tasks assessed/performed Overall Cognitive Status: Within Functional Limits for tasks assessed  General Comments      Exercises        Assessment/Plan    PT Assessment Patient needs continued PT services  PT Diagnosis Difficulty walking;Generalized weakness   PT Problem List Decreased strength;Decreased activity tolerance;Decreased balance;Decreased mobility;Decreased knowledge of use of DME;Cardiopulmonary status limiting activity  PT Treatment Interventions DME instruction;Gait training;Stair training;Functional mobility training;Therapeutic activities;Therapeutic  exercise;Patient/family education;Balance training   PT Goals (Current goals can be found in the Care Plan section) Acute Rehab PT Goals Patient Stated Goal: to go home and not to a SNF PT Goal Formulation: With patient Time For Goal Achievement: 01/31/14 Potential to Achieve Goals: Good    Frequency Min 3X/week   Barriers to discharge        Co-evaluation               End of Session   Activity Tolerance: Patient limited by fatigue Patient left: in bed;with call bell/phone within reach           Time: 1430-1448 PT Time Calculation (min): 18 min   Charges:   PT Evaluation $Initial PT Evaluation Tier I: 1 Procedure PT Treatments $Gait Training: 8-22 mins   PT G Codes:          Weston Anna, MPT Pager: [phone]

## 2014-01-17 NOTE — Progress Notes (Signed)
    Subjective:  Denies CP or dyspnea   Objective:  Filed Vitals:   01/17/14 0000 01/17/14 0200 01/17/14 0400 01/17/14 0600  BP: 135/55 119/38 129/52 129/55  Pulse: 64 70 70 63  Temp: 98.3 F (36.8 C)  98.5 F (36.9 C)   TempSrc: Oral  Oral   Resp: 19 16 18 18   Height:      Weight:      SpO2: 100% 98% 97% 100%    Intake/Output from previous day:  Intake/Output Summary (Last 24 hours) at 01/17/14 0749 Last data filed at 01/17/14 0700  Gross per 24 hour  Intake   1330 ml  Output   1975 ml  Net   -645 ml    Physical Exam: Physical exam: Well-developed well-nourished in no acute distress.  Skin is warm and dry.  HEENT is normal.  Neck is supple.  Chest is clear to auscultation with normal expansion.  Cardiovascular exam is regular rate and rhythm.  Abdominal exam s/p abdominal surgery Extremities show no edema. neuro grossly intact    Lab Results: Basic Metabolic Panel:  Recent Labs  01/16/14 1010 01/16/14 1455  NA 137 140  K 3.2* 3.0*  CL 100 103  CO2 27 29  GLUCOSE 195* 148*  BUN 5* 4*  CREATININE 0.70 0.67  CALCIUM 8.6 8.7   CBC:  Recent Labs  01/16/14 1010  WBC 7.2  HGB 9.5*  HCT 29.9*  MCV 81.0  PLT 240     Assessment/Plan:  1 postoperative atrial fibrillation-the patient has converted to sinus rhythm. Await echocardiograml; TSH normal. Continue metoprolol. Would not anticoagulate unless atrial fibrillation recurs. 2 colon cancer status post resection-management per general surgery. 3 dysphagia-awaiting EGD. 4 COPD  Kirk Ruths 01/17/2014, 7:49 AM

## 2014-01-17 NOTE — Progress Notes (Signed)
Name: Amanda [name] MRN: [id number] DOB: 1940/10/13    ADMISSION DATE:  01/08/2014 CONSULTATION DATE:  01/11/2014  REFERRING MD :  Dr. Rockne Menghini  CHIEF COMPLAINT:  Pre-op respiratory evaluation  BRIEF PATIENT DESCRIPTION:  73 yo female former smoker admitted with dyspnea, fatigue, and anemia.  She was found to have cecal mass, and is scheduled for resection.  She is followed by Dr. Lake Bells for COPD (severe based on CT 01/01/14 and clinical grounds but never had PFT's).  Pulmonary consult requested to assess pre-operative respiratory status.   has a past medical history of Emphysema lung; Hyperlipidemia; GERD (gastroesophageal reflux disease); Vitamin D deficiency; Closed fracture of unspecified part of upper end of humerus; Complete rupture of rotator cuff; Other vitamin B12 deficiency anemia; Neurogenic bladder, NOS; COPD (chronic obstructive pulmonary disease); Type II or unspecified type diabetes mellitus without mention of complication, uncontrolled; Essential hypertension, benign; and Esophageal stricture (12/28/2013).   has past surgical history that includes ORIF shoulder fracture (Right, 2011); Tonsillectomy and adenoidectomy (age 72 or 20); Eye surgery (Bilateral, 2010); Esophagogastroduodenoscopy (egd) with propofol (N/A, 01/09/2014); Colonoscopy (N/A, 01/10/2014); and Laparoscopic partial colectomy (N/A, 01/12/2014).   STUDIES:  6/01 CT chest >> atherosclerosis, mild apical scarring, moderate/severe emphysema 6/10 Colonoscopy >> cecal mass 6/10 CT abdomen/pelvis >> 2.2 cm mass in cecum   SIGNIFICANT EVENTS: 6/08 Admit 6/10 [facility] consulted 6/12 Laparoscopic-assisted right colectomy, transanal excision of rectal polyp 6/13- excellent resp status 01/13/14 - denies dyspnea, only wound pain 01/15/14: Overall better but reports being deconditioned and a bit more dyspneic due to abdominal pain and restricted breathing   SUBJECTIVE/OVERNIGHT/INTERVAL HX 01/16/14: Eso dilatation today. Still  deconditioned but improved. REluctantly open to going to SNF rehab if need be  VITAL SIGNS: Temp:  [97.6 F (36.4 C)-98.5 F (36.9 C)] 98.5 F (36.9 C) (06/17 0400) Pulse Rate:  [63-105] 77 (06/17 0700) Resp:  [13-25] 17 (06/17 0700) BP: (119-190)/(38-104) 146/54 mmHg (06/17 0700) SpO2:  [92 %-100 %] 97 % (06/17 0828) 2 liters  PHYSICAL EXAMINATION: General: no distress, more conditioned but still deconditioned.  Neuro:  Alert,Moves all 4s HEENT:  Pupils reactive Cardiovascular:  Regular, no murmur s1 s 2 Lungs:  Decreased breath sounds, exp wheeze  Abdomen:  Soft, mild tenderness, wound dressings clean Musculoskeletal:  No edema Skin:  No rashes  PULMONARY No results found for this basename: PHART, PCO2, PCO2ART, PO2, PO2ART, HCO3, TCO2, O2SAT,  in the last 168 hours  CBC  Recent Labs Lab 01/13/14 0313 01/14/14 0555 01/16/14 1010  HGB 9.2* 8.5* 9.5*  HCT 29.4* 26.7* 29.9*  WBC 30.0* 14.8* 7.2  PLT 292 229 240    COAGULATION No results found for this basename: INR,  in the last 168 hours  CARDIAC  No results found for this basename: TROPONINI,  in the last 168 hours No results found for this basename: PROBNP,  in the last 168 hours   CHEMISTRY  Recent Labs Lab 01/13/14 0313 01/14/14 0555 01/16/14 1010 01/16/14 1455 01/17/14 0814  NA 136* 137 137 140 138  K 4.2 3.7 3.2* 3.0* PENDING  CL 101 104 100 103 102  CO2 24 22 27 29 27   GLUCOSE 263* 193* 195* 148* 162*  BUN 13 12 5* 4* 3*  CREATININE 0.98 0.95 0.70 0.67 0.75  CALCIUM 8.7 8.3* 8.6 8.7 8.6   Estimated Creatinine Clearance: 45.8 ml/min (by C-G formula based on Cr of 0.75).   LIVER No results found for this basename: AST, ALT, ALKPHOS, BILITOT, PROT, ALBUMIN, INR,  in the last 168 hours   INFECTIOUS No results found for this basename: LATICACIDVEN, PROCALCITON,  in the last 168 hours   ENDOCRINE CBG (last 3)   Recent Labs  01/15/14 2101 01/16/14 1528 01/16/14 2140  GLUCAP 156* 137*  171*     IMAGING x48h  No results found.    ASSESSMENT:  COPD/emphysema./chronic resp failure  - clinically stable but deconditioning and abdominal incision weighing on resp status. She does have some increased dyspnea w/ activity above baseline. Hard to know if this was AF related or the lungs contributed to the AF  Plan:  Oxygen to keep SpO2 > 88-92%, limit high sats  Mobilize as tolerated,   Aggressive IS --> add flutter   Change to BROVANA and Budesonide. Back to dulera at DC  PRN xopenex   Get CXR   OK to go ahead w/ ENDO from our stand-point   AF w/ RVR Plan  Per cards   Colon cancer s/p resection. Plan: Post op care, nutrition per [facility]      01/17/2014 9:11 AM

## 2014-01-17 NOTE — Progress Notes (Signed)
STAFF NOTE   - seen patient. Overnight events noted. She is very deconditioned at this point. Ok for endoscopy but would advise LMA and diprivan as opposed to moderate sedation or proceed with moderate sedation but with caution; will check with GI their plan. Rest per NP. She likely needs SNF rehab at dc - she is reluctant but husband who I spoke to yesterday seems to support my rec   Dr. Brand Males, M.D., [facility].C.P Pulmonary and Critical Care Medicine Staff Physician [facility] Pager: [phone], If no answer or between  15:00h - 7:00h: call [phone]  01/17/2014 9:55 AM

## 2014-01-17 NOTE — Progress Notes (Signed)
PT demonstrated verbal and hands on understanding of Flutter device. 

## 2014-01-17 NOTE — Progress Notes (Signed)
PROGRESS NOTE  Amanda [name] ION:[id number] DOB: 1941/03/15 DOA: 01/08/2014 PCP: Tamsen Roers, MD  HPI: 73 y.o. female with a PMH of COPD emphysema, hypertension, type 2 diabetes mellitus, GERD, hyperlipidemia, seen in ED 1 week prior to this presentation for COPD and at which time CT angio chest was negative for PE. At that time she was found to have Hgb of 7.5 and was given oral iron supplementations and subsequently discharged home. Patient was admitted 01/08/14 with complaints of fatigue, generalized weakness, and a four-week history of increasing shortness of breath. She was found to have hemoglobin of 6.5 and patient additionally reported black stools, unintentional 10 pound weight loss which pt attributed to ongoing dysphagia for past 1 month PTA. Hospital course was complicated due to finding of cecal mass on colonoscopy confirmed to be colon cancer. Pt underwent laparoscopic-assisted right colectomy and transanal excision of rectal polyp 01/12/2014. She was scheduled for EGD 01/16/2014 but prior to procedure went in to A fib requiring transfer to SDU. Cardiology was consulted.  Assessment/Plan: Acute on chronic anemia secondary to GI bleeding in the setting of newly diagnosed colon cancer  - Pt initially admitted to SDU. She has received 2 units PRBC transfusion with post-transfusion Hgb 9.5. Hgb stable at 8.5 - 9.5  - Status post colonoscopy 01/10/2014 with findings of cecal mass and multiple polyps. Pt underwent laparoscopic-assisted right colectomy and transanal excision of rectal polyp 01/12/2014. Due to concern for possible difficult extubation she was transferred to SDU after surgery. She was doing quite good after surgery and has had no complications since.  - Appreciate surgery and GI following.  - Staging CT of abdomen and pelvis done 01/10/14, no evidence of mets. Had CT chest done already on 01/01/14, so would not repeat.  New onset atrial fibrillation  - Patient was scheduled for endoscopy  however procedure not done because patient went into atrial fibrillation on 6/16.  - Order placed for 2-D echo. - cardiology consulted, appreciate input.  - back to sinus rhythm this morning, continue metop.  Hypertension  - Continue Norvasc 5 mg daily.  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 / candida esophagitis  - EGD done 01/09/14 with findings of tight peptic stricture in distal esophagus, s/p balloon dilation to 10 mm, candida esophagitis, plan for repeat EGD per GI to dilate again - EGD on hold pending A fib evaluation/2D echo - Was on protonix drip until prior to surgery  - Continue Diflucan 100 mg daily  COPD (chronic obstructive pulmonary disease)  - Stable, pulmonary is following  GERD (gastroesophageal reflux disease)  - Protonix drip stopped prior to surgery 01/12/2014  Type II or unspecified type diabetes mellitus without mention of complication, uncontrolled  - Continue insulin sensitive sliding scale.  - CBGs look OK   Diet: clear liquid Fluids: none DVT Prophylaxis: heparin s.q.  Code Status: Full Family Communication: d/w patient  Disposition Plan: inpatient  Consultants:  Cardiology  PCCM  Surgery   Procedures: EGD 01/09/14: Tight peptic stricture in the distal esophagus, s/p balloon dilation to 10 mm; Candida esophagitis.  Colonoscopy 01/10/14: 1. Bleeding malignant tumor/mass at the ileocecal valve; multiple biopsies were performed 2. Sessile polyp measuring 2-3 cm in size was found at the hepatic flexure - not removed 3. Sessile polyp measuring 6 mm in size was found at the hepatic flexure - not removed 4. Two sessile polyps measuring 3 and 4 mm in size were  found in the transverse colon; polypectomy was performed 5. Sessile polyp measuring 3 mm in size was found in the descending colon; polypectomy was performed 6. Sessile polyp measuring 2 cm in size was found in the rectum; multiple biopsies  were performed - not removed 7. There was severe diverticulosis noted in the sigmoid colon  Laparoscopic-assisted right colectomy. 2. Transanal excision of rectal polyp 01/12/2014   Antibiotics Cefotetan pre-op Fluconazole 6/9 >>  HPI/Subjective: No complaints  Objective: Filed Vitals:   01/17/14 0000 01/17/14 0200 01/17/14 0400 01/17/14 0600  BP: 135/55 119/38 129/52 129/55  Pulse: 64 70 70 63  Temp: 98.3 F (36.8 C)  98.5 F (36.9 C)   TempSrc: Oral  Oral   Resp: 19 16 18 18   Height:      Weight:      SpO2: 100% 98% 97% 100%    Intake/Output Summary (Last 24 hours) at 01/17/14 0735 Last data filed at 01/17/14 0700  Gross per 24 hour  Intake   1330 ml  Output   1975 ml  Net   -645 ml   Filed Weights   01/12/14 1615 01/13/14 0400 01/13/14 1110  Weight: 51.9 kg (114 lb 6.7 oz) 49.8 kg (109 lb 12.6 oz) 49.578 kg (109 lb 4.8 oz)   Exam:  General:  NAD  Cardiovascular: regular rate and rhythm, without MRG  Respiratory: good air movement, clear to auscultation throughout, no wheezing, ronchi or rales  Abdomen: soft, positive bowel sounds  MSK: no peripheral edema  Neuro: non focal  Data Reviewed: Basic Metabolic Panel:  Recent Labs Lab 01/13/14 0313 01/14/14 0555 01/16/14 1010 01/16/14 1455 01/17/14 0814  NA 136* 137 137 140 138  K 4.2 3.7 3.2* 3.0* 3.7  CL 101 104 100 103 102  CO2 24 22 27 29 27   GLUCOSE 263* 193* 195* 148* 162*  BUN 13 12 5* 4* 3*  CREATININE 0.98 0.95 0.70 0.67 0.75  CALCIUM 8.7 8.3* 8.6 8.7 8.6   CBC:  Recent Labs Lab 01/11/14 0320 01/12/14 0327 01/13/14 0313 01/14/14 0555 01/16/14 1010  WBC 12.7* 8.6 30.0* 14.8* 7.2  NEUTROABS  --   --   --  13.3*  --   HGB 9.5* 9.2* 9.2* 8.5* 9.5*  HCT 29.7* 29.1* 29.4* 26.7* 29.9*  MCV 78.2 80.4 81.4 82.2 81.0  PLT 312 290 292 229 240   BNP (last 3 results)  Recent Labs  01/01/14 1100  PROBNP 1711.0*   CBG:  Recent Labs Lab 01/15/14 1213 01/15/14 1637 01/15/14 2101  01/16/14 1528 01/16/14 2140  GLUCAP 303* 88 156* 137* 171*    Recent Results (from the past 240 hour(s))  MRSA PCR SCREENING     Status: None   Collection Time    01/08/14  4:26 PM      Result Value Ref Range Status   MRSA by PCR NEGATIVE  NEGATIVE Final   Comment:            The GeneXpert MRSA Assay (FDA     approved for NASAL specimens     only), is one component of a     comprehensive MRSA colonization     surveillance program. It is not     intended to diagnose MRSA     infection nor to guide or     monitor treatment for     MRSA infections.  URINE CULTURE     Status: None   Collection Time    01/08/14  5:28 PM  Result Value Ref Range Status   Specimen Description URINE, CLEAN CATCH   Final   Special Requests NONE   Final   Culture  Setup Time     Final   Value: 01/08/2014 22:44     Performed at [facility] Count     Final   Value: NO GROWTH     Performed at [facility]   Culture     Final   Value: NO GROWTH     Performed at [facility]   Report Status 01/09/2014 FINAL   Final  SURGICAL PCR SCREEN     Status: None   Collection Time    01/12/14  7:23 AM      Result Value Ref Range Status   MRSA, PCR NEGATIVE  NEGATIVE Final   Staphylococcus aureus NEGATIVE  NEGATIVE Final   Comment:            The Xpert SA Assay (FDA     approved for NASAL specimens     in patients over 10 years of age),     is one component of     a comprehensive surveillance     program.  Test performance has     been validated by [employer] for patients greater     than or equal to 38 year old.     It is not intended     to diagnose infection nor to     guide or monitor treatment.   Studies: No results found.  Scheduled Meds: . alvimopan  12 mg Oral BID  . amLODipine  5 mg Oral Daily  . antiseptic oral rinse  15 mL Mouth Rinse q12n4p  . budesonide (PULMICORT) nebulizer solution  0.25 mg Nebulization Q12H  . chlorhexidine  15 mL Mouth  Rinse BID  . docusate sodium  100 mg Oral BID  . fluconazole  100 mg Oral Daily  . heparin subcutaneous  5,000 Units Subcutaneous 3 times per day  . insulin aspart  0-5 Units Subcutaneous QHS  . insulin aspart  0-9 Units Subcutaneous TID WC  . ipratropium-albuterol  3 mL Nebulization QID  . metoprolol tartrate  25 mg Oral BID  . pantoprazole (PROTONIX) IV  40 mg Intravenous Q12H  . polyethylene glycol  17 g Oral BID   Continuous Infusions: . dextrose 5 % and 0.9% NaCl 50 mL/hr at 01/16/14 1559   Principal Problem:   Anemia Active Problems:   GI bleed   Dysphagia   COPD (chronic obstructive pulmonary disease)   GERD (gastroesophageal reflux disease)   Hyperlipidemia   Type II or unspecified type diabetes mellitus without mention of complication, uncontrolled   Essential hypertension, benign   Stricture and stenosis of esophagus   Candida esophagitis   Colon cancer   Benign neoplasm of colon   Diverticulosis of colon (without mention of hemorrhage)   Diverticulosis of colon without hemorrhage   Malignant hypertension   Pre-operative respiratory examination   COPD with emphysema  Time spent: 35  This note has been created with Surveyor, quantity. Any transcriptional errors are unintentional.   Marzetta Board, MD [facility] Pager [phone]. If 7 PM - 7 AM, please contact night-coverage at [email or web address], password [facility] 01/17/2014, 7:35 AM  LOS: 9 days

## 2014-01-18 LAB — CBC
HEMATOCRIT: 27.3 % — AB (ref 36.0–46.0)
Hemoglobin: 8.6 g/dL — ABNORMAL LOW (ref 12.0–15.0)
MCH: 25.4 pg — ABNORMAL LOW (ref 26.0–34.0)
MCHC: 31.5 g/dL (ref 30.0–36.0)
MCV: 80.5 fL (ref 78.0–100.0)
Platelets: 285 10*3/uL (ref 150–400)
RBC: 3.39 MIL/uL — ABNORMAL LOW (ref 3.87–5.11)
RDW: 22.5 % — AB (ref 11.5–15.5)
WBC: 5.7 10*3/uL (ref 4.0–10.5)

## 2014-01-18 LAB — GLUCOSE, CAPILLARY
GLUCOSE-CAPILLARY: 177 mg/dL — AB (ref 70–99)
GLUCOSE-CAPILLARY: 132 mg/dL — AB (ref 70–99)
Glucose-Capillary: 269 mg/dL — ABNORMAL HIGH (ref 70–99)
Glucose-Capillary: 173 mg/dL — ABNORMAL HIGH (ref 70–99)

## 2014-01-18 LAB — BASIC METABOLIC PANEL
BUN: 4 mg/dL — ABNORMAL LOW (ref 6–23)
CHLORIDE: 103 meq/L (ref 96–112)
CO2: 26 mEq/L (ref 19–32)
Calcium: 8.7 mg/dL (ref 8.4–10.5)
Creatinine, Ser: 0.82 mg/dL (ref 0.50–1.10)
GFR calc non Af Amer: 68 mL/min — ABNORMAL LOW (ref >90)
GFR, EST AFRICAN AMERICAN: 79 mL/min — AB (ref >90)
GLUCOSE: 142 mg/dL — AB (ref 70–99)
POTASSIUM: 3.6 meq/L — AB (ref 3.7–5.3)
Sodium: 138 mEq/L (ref 137–147)

## 2014-01-18 MED ORDER — POTASSIUM CHLORIDE CRYS ER 20 MEQ PO TBCR
40.0000 meq | EXTENDED_RELEASE_TABLET | Freq: Once | ORAL | Status: AC
  Administered 2014-01-18: 40 meq via ORAL
  Filled 2014-01-18: qty 2

## 2014-01-18 MED ORDER — ARFORMOTEROL TARTRATE 15 MCG/2ML IN NEBU
15.0000 ug | INHALATION_SOLUTION | Freq: Two times a day (BID) | RESPIRATORY_TRACT | Status: DC
  Administered 2014-01-18 – 2014-01-21 (×7): 15 ug via RESPIRATORY_TRACT
  Filled 2014-01-18 (×10): qty 2

## 2014-01-18 MED ORDER — FUROSEMIDE 20 MG PO TABS
20.0000 mg | ORAL_TABLET | Freq: Once | ORAL | Status: AC
  Administered 2014-01-18: 20 mg via ORAL
  Filled 2014-01-18: qty 1

## 2014-01-18 NOTE — Progress Notes (Signed)
     [facility] Progress Note  Subjective:  Feels ok.  She is hungry.  Diet being advanced to soft today by surgery.  Objective:  Vital signs in last 24 hours: Temp:  [98 F (36.7 C)-98.5 F (36.9 C)] 98 F (36.7 C) (06/18 0812) Pulse Rate:  [66-70] 66 (06/17 2115) Resp:  [19-21] 20 (06/17 2115) BP: (145-169)/(46-77) 165/77 mmHg (06/18 0812) SpO2:  [96 %-99 %] 96 % (06/18 0812) Weight:  [119 lb 8 oz (54.205 kg)] 119 lb 8 oz (54.205 kg) (06/18 0400) Last BM Date: 01/18/14 General:  Alert, Well-developed, in NAD Heart:  Regular rate and rhythm; no murmurs Pulm:  Decreased BS B/L Abdomen:  Soft, non-distended. Normal bowel sounds.  Appropriately tender. Extremities:  Without edema. Neurologic:  Alert and  oriented x4;  grossly normal neurologically. Psych:  Alert and cooperative. Normal mood and affect.  Assessment / Plan: 1. Colon cancer (cecum). No metastatic disease on CT scan. POD #6 s/p lap assisted right hemicolectomy. 2. Hepatic flexure polyp and rectal polyp: Removed with hemicolectomy and transanal excision, respectively (were tubular adenomas).  3. Esophageal stricture, s/p dilation on 6/9. Stricture still tight post dilation.  4. Candida esophagitis, getting Diflucan  5. 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  6. New onset Afib: Converted back to sinus rhythm. ECHO with normal LV function.  *Plan for repeat EGD with dilation tomorrow, 6/19.    LOS: 10 days   ZEHR, JESSICA D.  01/18/2014, 9:12 AM  Pager number [phone]   [place] Attending  I have also seen and assessed the patient and agree with the above note. Gatha Mayer, MD, [facility] [phone] (pager) 01/18/2014 5:00 PM

## 2014-01-18 NOTE — Progress Notes (Addendum)
Name: Amanda [name] MRN: [id number] DOB: 10-23-1940    ADMISSION DATE:  01/08/2014 CONSULTATION DATE:  01/11/2014  REFERRING MD :  Dr. Rockne Menghini  CHIEF COMPLAINT:  Pre-op respiratory evaluation  BRIEF PATIENT DESCRIPTION:  73 yo female former smoker admitted with dyspnea, fatigue, and anemia.  She was found to have cecal mass, and is scheduled for resection.  She is followed by Dr. Lake Bells for COPD (severe based on CT 01/01/14 and clinical grounds but never had PFT's).  Pulmonary consult requested to assess pre-operative respiratory status.   has a past medical history of Emphysema lung; Hyperlipidemia; GERD (gastroesophageal reflux disease); Vitamin D deficiency; Closed fracture of unspecified part of upper end of humerus; Complete rupture of rotator cuff; Other vitamin B12 deficiency anemia; Neurogenic bladder, NOS; COPD (chronic obstructive pulmonary disease); Type II or unspecified type diabetes mellitus without mention of complication, uncontrolled; Essential hypertension, benign; and Esophageal stricture (12/28/2013).   has past surgical history that includes ORIF shoulder fracture (Right, 2011); Tonsillectomy and adenoidectomy (age 50 or 33); Eye surgery (Bilateral, 2010); Esophagogastroduodenoscopy (egd) with propofol (N/A, 01/09/2014); Colonoscopy (N/A, 01/10/2014); Laparoscopic partial colectomy (N/A, 01/12/2014); and Esophagogastroduodenoscopy (N/A, 01/16/2014).   STUDIES:  6/01 CT chest >> atherosclerosis, mild apical scarring, moderate/severe emphysema 6/10 Colonoscopy >> cecal mass 6/10 CT abdomen/pelvis >> 2.2 cm mass in cecum   SIGNIFICANT EVENTS: 6/08 Admit 6/10 CCS consulted 6/12 Laparoscopic-assisted right colectomy, transanal excision of rectal polyp 6/13- excellent resp status 01/13/14 - denies dyspnea, only wound pain 01/15/14: Overall better but reports being deconditioned and a bit more dyspneic due to abdominal pain and restricted breathing   SUBJECTIVE/OVERNIGHT/INTERVAL  HX 01/16/14: Eso dilatation today. Still deconditioned but improved. REluctantly open to going to SNF rehab if need be  VITAL SIGNS: Temp:  [98 F (36.7 C)-98.5 F (36.9 C)] 98 F (36.7 C) (06/18 0812) Pulse Rate:  [66-81] 66 (06/17 2115) Resp:  [18-21] 20 (06/17 2115) BP: (145-169)/(46-77) 165/77 mmHg (06/18 0812) SpO2:  [96 %-99 %] 96 % (06/18 0812) Weight:  [54.205 kg (119 lb 8 oz)] 54.205 kg (119 lb 8 oz) (06/18 0400) 2 liters  PHYSICAL EXAMINATION: General: no distress, more conditioned but still deconditioned.  Neuro:  Alert,Moves all 4s HEENT:  Pupils reactive Cardiovascular:  Regular, no murmur s1 s 2 Lungs:  Good air movement. No wheeze   Abdomen:  Soft, mild tenderness, wound dressings clean Musculoskeletal:  No edema Skin:  No rashes  PULMONARY No results found for this basename: PHART, PCO2, PCO2ART, PO2, PO2ART, HCO3, TCO2, O2SAT,  in the last 168 hours  CBC  Recent Labs Lab 01/14/14 0555 01/16/14 1010 01/18/14 0315  HGB 8.5* 9.5* 8.6*  HCT 26.7* 29.9* 27.3*  WBC 14.8* 7.2 5.7  PLT 229 240 285    COAGULATION No results found for this basename: INR,  in the last 168 hours  CARDIAC  No results found for this basename: TROPONINI,  in the last 168 hours No results found for this basename: PROBNP,  in the last 168 hours   CHEMISTRY  Recent Labs Lab 01/14/14 0555 01/16/14 1010 01/16/14 1455 01/17/14 0814 01/18/14 0315  NA 137 137 140 138 138  K 3.7 3.2* 3.0* 3.7 3.6*  CL 104 100 103 102 103  CO2 22 27 29 27 26   GLUCOSE 193* 195* 148* 162* 142*  BUN 12 5* 4* 3* 4*  CREATININE 0.95 0.70 0.67 0.75 0.82  CALCIUM 8.3* 8.6 8.7 8.6 8.7   Estimated Creatinine Clearance: 44.7 ml/min (by C-G formula based  on Cr of 0.82).   LIVER No results found for this basename: AST, ALT, ALKPHOS, BILITOT, PROT, ALBUMIN, INR,  in the last 168 hours   INFECTIOUS No results found for this basename: LATICACIDVEN, PROCALCITON,  in the last 168  hours   ENDOCRINE CBG (last 3)   Recent Labs  01/17/14 1640 01/17/14 2138 01/17/14 2310  GLUCAP 133* 130* 133*     IMAGING x48h  No results found.    ASSESSMENT:  COPD/emphysema./chronic resp failure  - clinically stable but deconditioning and abdominal incision weighing on resp status. Seems to like the nebulized therapies.  Plan:  Oxygen to keep SpO2 > 88-92%, limit high sats  Mobilize as tolerated,   Aggressive IS --> add flutter   Cont BROVANA and Budesonide. Back to dulera at DC  PRN xopenex   OK to go ahead w/ ENDO from our stand-point  But recommend with diprivan and LMA (Staff MD d/w PA Janett Billow Zehr of GI)  AF w/ RVR >now NSR Plan  Per cards   Colon cancer s/p resection. Plan: Post op care, nutrition per CCS  GLOBAL  PCCM will sign off   PETE BABCOCK. NP 01/18/2014 8:46 AM   STAFF MD  - she is stable from copd stand point. Please start dulera/spiriva prior to dc. PCCM wil sign off. Call if needed again. GIprocedure recs - see above. REst per NP. FU Dr Lake Bells pulm 01/29/14 set   Dr. Brand Males, M.D., [facility].C.P Pulmonary and Critical Care Medicine Staff Physician [facility] Pager: [phone], If no answer or between  15:00h - 7:00h: call [phone]  01/18/2014 12:01 PM

## 2014-01-18 NOTE — Progress Notes (Signed)
    Subjective:  Denies CP; mild dyspea   Objective:  Filed Vitals:   01/17/14 2046 01/17/14 2115 01/18/14 0000 01/18/14 0400  BP:  155/64    Pulse:  66    Temp: 98 F (36.7 C)  98.3 F (36.8 C) 98.5 F (36.9 C)  TempSrc: Oral  Oral Oral  Resp:  20    Height:      Weight:    119 lb 8 oz (54.205 kg)  SpO2: 98% 96%      Intake/Output from previous day:  Intake/Output Summary (Last 24 hours) at 01/18/14 0754 Last data filed at 01/18/14 0300  Gross per 24 hour  Intake   1100 ml  Output    975 ml  Net    125 ml    Physical Exam: Physical exam: Well-developed well-nourished in no acute distress.  Skin is warm and dry.  HEENT is normal.  Neck is supple.  Chest with mildly diminished BS  Cardiovascular exam is regular rate and rhythm.  Abdominal exam s/p abdominal surgery Extremities show 1+ ankle edema. neuro grossly intact    Lab Results: Basic Metabolic Panel:  Recent Labs  01/17/14 0814 01/18/14 0315  NA 138 138  K 3.7 3.6*  CL 102 103  CO2 27 26  GLUCOSE 162* 142*  BUN 3* 4*  CREATININE 0.75 0.82  CALCIUM 8.6 8.7   CBC:  Recent Labs  01/16/14 1010 01/18/14 0315  WBC 7.2 5.7  HGB 9.5* 8.6*  HCT 29.9* 27.3*  MCV 81.0 80.5  PLT 240 285     Assessment/Plan:  1 postoperative atrial fibrillation-the patient remains in sinus rhythm. Echo with normal LV function; TSH normal. Continue metoprolol. Would not anticoagulate unless atrial fibrillation recurs. Atrial fibrillation most likely related to hyperadrenergic state associated with surgery. 2 colon cancer status post resection-management per general surgery. 3 dysphagia-awaiting EGD. 4 COPD 5 Postop volume excess-mildly volume overloaded; DC IVFs and will give lasix 20 mg IV x 1.  Kirk Ruths 01/18/2014, 7:54 AM

## 2014-01-18 NOTE — Progress Notes (Signed)
Patient ID: Amanda [name], female   DOB: 1941/05/18, 73 y.o.   MRN: [id number] 2 Days Post-Op  Subjective: Pt feeling ok this morning.  Hungry.  +BM.  Objective: Vital signs in last 24 hours: Temp:  [98 F (36.7 C)-98.5 F (36.9 C)] 98.5 F (36.9 C) (06/18 0400) Pulse Rate:  [66-81] 66 (06/17 2115) Resp:  [18-21] 20 (06/17 2115) BP: (145-169)/(46-64) 155/64 mmHg (06/17 2115) SpO2:  [96 %-99 %] 96 % (06/17 2115) Weight:  [119 lb 8 oz (54.205 kg)] 119 lb 8 oz (54.205 kg) (06/18 0400) Last BM Date: 01/17/14  Intake/Output from previous day: 06/17 0701 - 06/18 0700 In: 1100 [P.O.:300; I.V.:800] Out: 975 [Urine:975] Intake/Output this shift:    PE: Abd: soft, appropriately tender, honeycomb dressing removed.  +BS  Lab Results:   Recent Labs  01/16/14 1010 01/18/14 0315  WBC 7.2 5.7  HGB 9.5* 8.6*  HCT 29.9* 27.3*  PLT 240 285   BMET  Recent Labs  01/17/14 0814 01/18/14 0315  NA 138 138  K 3.7 3.6*  CL 102 103  CO2 27 26  GLUCOSE 162* 142*  BUN 3* 4*  CREATININE 0.75 0.82  CALCIUM 8.6 8.7   PT/INR No results found for this basename: LABPROT, INR,  in the last 72 hours CMP     Component Value Date/Time   NA 138 01/18/2014 0315   K 3.6* 01/18/2014 0315   CL 103 01/18/2014 0315   CO2 26 01/18/2014 0315   GLUCOSE 142* 01/18/2014 0315   BUN 4* 01/18/2014 0315   CREATININE 0.82 01/18/2014 0315   CALCIUM 8.7 01/18/2014 0315   PROT 6.3 01/08/2014 1311   ALBUMIN 3.4* 01/08/2014 1311   AST 15 01/08/2014 1311   ALT 12 01/08/2014 1311   ALKPHOS 55 01/08/2014 1311   BILITOT 0.3 01/08/2014 1311   GFRNONAA 68* 01/18/2014 0315   GFRAA 79* 01/18/2014 0315   Lipase     Component Value Date/Time   LIPASE 29 07/15/2010 2105       Studies/Results: No results found.  Anti-infectives: Anti-infectives   Start     Dose/Rate Route Frequency Ordered Stop   01/12/14 2200  cefoTEtan (CEFOTAN) 2 g in dextrose 5 % 50 mL IVPB     2 g 100 mL/hr over 30 Minutes Intravenous Every 12 hours  01/12/14 1617 01/12/14 2249   01/12/14 2000  cefoTEtan (CEFOTAN) 1 g in dextrose 5 % 50 mL IVPB  Status:  Discontinued     1 g 100 mL/hr over 30 Minutes Intravenous Every 12 hours 01/11/14 1511 01/12/14 1617   01/12/14 0900  cefoTEtan (CEFOTAN) 1 g in dextrose 5 % 50 mL IVPB  Status:  Discontinued     1 g 100 mL/hr over 30 Minutes Intravenous Every 12 hours 01/11/14 1459 01/11/14 1511   01/12/14 0800  cefoTEtan (CEFOTAN) 2 g in dextrose 5 % 50 mL IVPB     2 g 100 mL/hr over 30 Minutes Intravenous On call 01/12/14 0713 01/12/14 1200   01/09/14 1600  fluconazole (DIFLUCAN) 40 MG/ML suspension 100 mg     100 mg Oral Daily 01/09/14 1333         Assessment/Plan  1. POD 6, s/p lap assisted right hemicolectomy and transanal excision of benign polyp  2. 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 COPD  4. PAF  Plan: 1. Will give soft diet today.  Plan for endo tomorrow 2. Surgically doing well and stable for dc home whenever medically stable.  LOS: 10 days    OSBORNE,KELLY E 01/18/2014, 8:06 AM Pager: [phone]

## 2014-01-18 NOTE — Progress Notes (Signed)
PROGRESS NOTE  Amanda [name] WUJ:[id number] DOB: 1940/09/23 DOA: 01/08/2014 PCP: Tamsen Roers, MD  HPI: 73 y.o. female with a PMH of COPD emphysema, hypertension, type 2 diabetes mellitus, GERD, hyperlipidemia, seen in ED 1 week prior to this presentation for COPD and at which time CT angio chest was negative for PE. At that time she was found to have Hgb of 7.5 and was given oral iron supplementations and subsequently discharged home. Patient was admitted 01/08/14 with complaints of fatigue, generalized weakness, and a four-week history of increasing shortness of breath. She was found to have hemoglobin of 6.5 and patient additionally reported black stools, unintentional 10 pound weight loss which pt attributed to ongoing dysphagia for past 1 month PTA. Hospital course was complicated due to finding of cecal mass on colonoscopy confirmed to be colon cancer. Pt underwent laparoscopic-assisted right colectomy and transanal excision of rectal polyp 01/12/2014. She was scheduled for EGD 01/16/2014 but prior to procedure went in to A fib requiring transfer to SDU. Cardiology was consulted.  Assessment/Plan: Acute on chronic anemia secondary to GI bleeding in the setting of newly diagnosed colon cancer  - Pt initially admitted to SDU. She has received 2 units PRBC transfusion with post-transfusion Hgb 9.5. Hgb stable at 8.5 - 9.5  - Status post colonoscopy 01/10/2014 with findings of cecal mass and multiple polyps. Pt underwent laparoscopic-assisted right colectomy and transanal excision of rectal polyp 01/12/2014. Due to concern for possible difficult extubation she was transferred to SDU after surgery. She was doing quite good after surgery and has had no complications since.  - Appreciate surgery and GI following.  - Staging CT of abdomen and pelvis done 01/10/14, no evidence of mets. Had CT chest done already on 01/01/14, so would not repeat.  New onset atrial fibrillation  - Patient was scheduled for endoscopy  however procedure not done because patient went into atrial fibrillation on 6/16.  - Order placed for 2-D echo. - cardiology consulted, appreciate input.  - in sinus now, continue metop.  Hypertension  - Continue Norvasc 5 mg daily.  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 / candida esophagitis  - EGD done 01/09/14 with findings of tight peptic stricture in distal esophagus, s/p balloon dilation to 10 mm, candida esophagitis, plan for repeat EGD per GI to dilate again - EGD on hold pending A fib evaluation/2D echo - Was on protonix drip until prior to surgery  - Continue Diflucan 100 mg daily - plan for repeat EGD 6/19 per GI  COPD (chronic obstructive pulmonary disease)  - Stable, pulmonary is following  GERD (gastroesophageal reflux disease)  - Protonix drip stopped prior to surgery 01/12/2014  Type II or unspecified type diabetes mellitus without mention of complication, uncontrolled  - Continue insulin sensitive sliding scale.    Diet: clear liquid Fluids: none DVT Prophylaxis: heparin s.q.  Code Status: Full Family Communication: d/w patient  Disposition Plan: inpatient  Consultants:  Cardiology  PCCM  Surgery   Gastroenterology   Procedures: EGD 01/09/14: Tight peptic stricture in the distal esophagus, s/p balloon dilation to 10 mm; Candida esophagitis.  Colonoscopy 01/10/14: 1. Bleeding malignant tumor/mass at the ileocecal valve; multiple biopsies were performed 2. Sessile polyp measuring 2-3 cm in size was found at the hepatic flexure - not removed 3. Sessile polyp measuring 6 mm in size was found at the hepatic flexure - not removed 4. Two sessile polyps measuring 3 and 4  mm in size were found in the transverse colon; polypectomy was performed 5. Sessile polyp measuring 3 mm in size was found in the descending colon; polypectomy was performed 6. Sessile polyp measuring 2 cm in size was found in the rectum;  multiple biopsies were performed - not removed 7. There was severe diverticulosis noted in the sigmoid colon  Laparoscopic-assisted right colectomy. 2. Transanal excision of rectal polyp 01/12/2014   Antibiotics Cefotetan pre-op Fluconazole 6/9 >>  HPI/Subjective: No complaints, she is hungry   Objective: Filed Vitals:   01/17/14 2046 01/17/14 2115 01/18/14 0000 01/18/14 0400  BP:  155/64    Pulse:  66    Temp: 98 F (36.7 C)  98.3 F (36.8 C) 98.5 F (36.9 C)  TempSrc: Oral  Oral Oral  Resp:  20    Height:      Weight:    54.205 kg (119 lb 8 oz)  SpO2: 98% 96%      Intake/Output Summary (Last 24 hours) at 01/18/14 0717 Last data filed at 01/18/14 0300  Gross per 24 hour  Intake   1100 ml  Output    975 ml  Net    125 ml   Filed Weights   01/13/14 0400 01/13/14 1110 01/18/14 0400  Weight: 49.8 kg (109 lb 12.6 oz) 49.578 kg (109 lb 4.8 oz) 54.205 kg (119 lb 8 oz)   Exam:  General:  NAD  Cardiovascular: regular rate and rhythm, without MRG  Respiratory: good air movement, clear to auscultation throughout, no wheezing, ronchi or rales  Abdomen: soft, positive bowel sounds  MSK: no peripheral edema  Neuro: non focal  Data Reviewed: Basic Metabolic Panel:  Recent Labs Lab 01/14/14 0555 01/16/14 1010 01/16/14 1455 01/17/14 0814 01/18/14 0315  NA 137 137 140 138 138  K 3.7 3.2* 3.0* 3.7 3.6*  CL 104 100 103 102 103  CO2 22 27 29 27 26   GLUCOSE 193* 195* 148* 162* 142*  BUN 12 5* 4* 3* 4*  CREATININE 0.95 0.70 0.67 0.75 0.82  CALCIUM 8.3* 8.6 8.7 8.6 8.7   CBC:  Recent Labs Lab 01/12/14 0327 01/13/14 0313 01/14/14 0555 01/16/14 1010 01/18/14 0315  WBC 8.6 30.0* 14.8* 7.2 5.7  NEUTROABS  --   --  13.3*  --   --   HGB 9.2* 9.2* 8.5* 9.5* 8.6*  HCT 29.1* 29.4* 26.7* 29.9* 27.3*  MCV 80.4 81.4 82.2 81.0 80.5  PLT 290 292 229 240 285   BNP (last 3 results)  Recent Labs  01/01/14 1100  PROBNP 1711.0*   CBG:  Recent Labs Lab  01/17/14 0837 01/17/14 1229 01/17/14 1640 01/17/14 2138 01/17/14 2310  GLUCAP 164* 187* 133* 130* 133*    Recent Results (from the past 240 hour(s))  MRSA PCR SCREENING     Status: None   Collection Time    01/08/14  4:26 PM      Result Value Ref Range Status   MRSA by PCR NEGATIVE  NEGATIVE Final   Comment:            The GeneXpert MRSA Assay (FDA     approved for NASAL specimens     only), is one component of a     comprehensive MRSA colonization     surveillance program. It is not     intended to diagnose MRSA     infection nor to guide or     monitor treatment for     MRSA infections.  URINE CULTURE  Status: None   Collection Time    01/08/14  5:28 PM      Result Value Ref Range Status   Specimen Description URINE, CLEAN CATCH   Final   Special Requests NONE   Final   Culture  Setup Time     Final   Value: 01/08/2014 22:44     Performed at [facility] Count     Final   Value: NO GROWTH     Performed at [facility]   Culture     Final   Value: NO GROWTH     Performed at [facility]   Report Status 01/09/2014 FINAL   Final  SURGICAL PCR SCREEN     Status: None   Collection Time    01/12/14  7:23 AM      Result Value Ref Range Status   MRSA, PCR NEGATIVE  NEGATIVE Final   Staphylococcus aureus NEGATIVE  NEGATIVE Final   Comment:            The Xpert SA Assay (FDA     approved for NASAL specimens     in patients over 40 years of age),     is one component of     a comprehensive surveillance     program.  Test performance has     been validated by [employer] for patients greater     than or equal to 30 year old.     It is not intended     to diagnose infection nor to     guide or monitor treatment.   Studies: No results found.  Scheduled Meds: . amLODipine  5 mg Oral Daily  . antiseptic oral rinse  15 mL Mouth Rinse q12n4p  . arformoterol  15 mcg Nebulization Q12H  . budesonide (PULMICORT) nebulizer  solution  0.25 mg Nebulization Q12H  . chlorhexidine  15 mL Mouth Rinse BID  . docusate sodium  100 mg Oral BID  . feeding supplement (RESOURCE BREEZE)  1 Container Oral TID BM  . fluconazole  100 mg Oral Daily  . heparin subcutaneous  5,000 Units Subcutaneous 3 times per day  . insulin aspart  0-5 Units Subcutaneous QHS  . insulin aspart  0-9 Units Subcutaneous TID WC  . metoprolol tartrate  25 mg Oral BID  . pantoprazole (PROTONIX) IV  40 mg Intravenous Q12H  . polyethylene glycol  17 g Oral BID   Continuous Infusions: . dextrose 5 % and 0.9% NaCl 50 mL/hr at 01/17/14 1525   Principal Problem:   Anemia Active Problems:   GI bleed   Dysphagia   COPD (chronic obstructive pulmonary disease)   GERD (gastroesophageal reflux disease)   Hyperlipidemia   Type II or unspecified type diabetes mellitus without mention of complication, uncontrolled   Essential hypertension, benign   Stricture and stenosis of esophagus   Candida esophagitis   Colon cancer   Benign neoplasm of colon   Diverticulosis of colon (without mention of hemorrhage)   Diverticulosis of colon without hemorrhage   Malignant hypertension   Pre-operative respiratory examination   COPD with emphysema  Time spent: 25  This note has been created with Surveyor, quantity. Any transcriptional errors are unintentional.   Marzetta Board, MD [facility] Pager [phone]. If 7 PM - 7 AM, please contact night-coverage at [email or web address], password [facility] 01/18/2014, 7:17 AM  LOS: 10 days

## 2014-01-18 NOTE — Progress Notes (Signed)
NUTRITION FOLLOW UP  Intervention:   - Soft Diet - Diet advancement per MD - Continue Resource Breeze po TID, each supplement provides 250 kcal and 9 grams of protein - RD will continue to monitor.  Nutrition Dx:   Inadequate oral intake related to clear liquid diet as evidenced by diet order; resolved as pt now upgraded to soft diet.  Goal:   Advance diet as tolerated to diabetic diet- not met  Monitor:   Diet order, po intake, labs, weight trends, GI profile, swallow profile  Assessment:   6/09:  - Pt and family report pt eats 4-6 small well balanced meals/day at home  - Pt c/o dysphagia x 15 years with 10 pound unintended weight loss in the past month  - Pt reports 2-3 times/week food will get stuck in the bottom of her throat and she will have to vomit it back up. States the worst is cornbread and dry bread.  - Not on any nutritional supplements at home  - Eating a popsicle during visit, did not perform nutrition focused physical exam    6/16:  -Pt newly dx colon cancer, s/p colectomy on 6/12 d/t cecal mass and multiple polyps per MD  -Tolerating clear liquid diet, consuming 65-75%. Received Resource Breeze on 6/10-6/11, then was cancelled d/t NPO status. Will continue to recommend as diet advancement tolerated  -Receiving Diflucan for esophagitis candidiasis  -Was NPO for EGD r/t dysphagia. EGD was cancelled when pt went into Afib w/RVR per MD   6/17:  -Tolerating clear liquid diet  -Complains of difficulty swallowing solids and would like to repeat dilation while here.  -Add [employer] while on clear liquid diet.  6/18: - Pt reports feeling hungry - Diet advanced to soft today by surgery. - Tolerating soft diet and nutritional supplements. Pt likes [employer]. - Will undergo repeat EGD with dilation tomorrow 6/19. - Pt's wt has increased 10 lbs since 6/13? Is in positive fluid balance.  Height: Ht Readings from Last 1 Encounters:  01/13/14 _0  (1.549 m)     Weight Status:   Wt Readings from Last 1 Encounters:  01/18/14 119 lb 8 oz (54.205 kg)    Re-estimated needs:  Kcal: 1350-1500 Protein: 70-80 g Fluid: ~1.5 L/day  Skin: incision on abdomen and perineum (both closed)  Diet Order: Criss Rosales   Intake/Output Summary (Last 24 hours) at 01/18/14 1231 Last data filed at 01/18/14 1100  Gross per 24 hour  Intake 1726.67 ml  Output    875 ml  Net 851.67 ml    Last BM: 6/18   Labs:   Recent Labs Lab 01/16/14 1455 01/17/14 0814 01/18/14 0315  NA 140 138 138  K 3.0* 3.7 3.6*  CL 103 102 103  CO2 _1 BUN 4* 3* 4*  CREATININE 0.67 0.75 0.82  CALCIUM 8.7 8.6 8.7  GLUCOSE 148* 162* 142*    CBG (last 3)   Recent Labs  01/17/14 2138 01/17/14 2310 01/18/14 1134  GLUCAP 130* 133* 269*    Scheduled Meds: . amLODipine  5 mg Oral Daily  . arformoterol  15 mcg Nebulization Q12H  . budesonide (PULMICORT) nebulizer solution  0.25 mg Nebulization Q12H  . docusate sodium  100 mg Oral BID  . feeding supplement (RESOURCE BREEZE)  1 Container Oral TID BM  . fluconazole  100 mg Oral Daily  . heparin subcutaneous  5,000 Units Subcutaneous 3 times per day  . insulin aspart  0-5 Units Subcutaneous QHS  .  insulin aspart  0-9 Units Subcutaneous TID WC  . metoprolol tartrate  25 mg Oral BID  . pantoprazole (PROTONIX) IV  40 mg Intravenous Q12H  . polyethylene glycol  17 g Oral BID    Continuous Infusions:   Terrace Arabia RD, LDN

## 2014-01-19 ENCOUNTER — Inpatient Hospital Stay (HOSPITAL_COMMUNITY): Payer: Medicare HMO | Admitting: Anesthesiology

## 2014-01-19 ENCOUNTER — Encounter (HOSPITAL_COMMUNITY): Payer: Medicare HMO | Admitting: Anesthesiology

## 2014-01-19 ENCOUNTER — Encounter (HOSPITAL_COMMUNITY)
Admission: EM | Disposition: A | Discharge status: Home Health | Payer: Self-pay | Source: Home / Self Care | Attending: Internal Medicine

## 2014-01-19 ENCOUNTER — Ambulatory Visit (HOSPITAL_COMMUNITY): Admission: RE | Admit: 2014-01-19 | Payer: Medicare HMO | Source: Ambulatory Visit | Admitting: Internal Medicine

## 2014-01-19 DIAGNOSIS — I519 Heart disease, unspecified: Secondary | ICD-10-CM | POA: Diagnosis not present

## 2014-01-19 DIAGNOSIS — C180 Malignant neoplasm of cecum: Secondary | ICD-10-CM | POA: Diagnosis not present

## 2014-01-19 DIAGNOSIS — B3781 Candidal esophagitis: Secondary | ICD-10-CM | POA: Diagnosis not present

## 2014-01-19 DIAGNOSIS — J9610 Chronic respiratory failure, unspecified whether with hypoxia or hypercapnia: Secondary | ICD-10-CM | POA: Diagnosis not present

## 2014-01-19 HISTORY — PX: ESOPHAGOGASTRODUODENOSCOPY (EGD) WITH PROPOFOL: SHX5813

## 2014-01-19 LAB — BASIC METABOLIC PANEL
BUN: 4 mg/dL — ABNORMAL LOW (ref 6–23)
CALCIUM: 8.8 mg/dL (ref 8.4–10.5)
CO2: 29 meq/L (ref 19–32)
Chloride: 97 mEq/L (ref 96–112)
Creatinine, Ser: 0.86 mg/dL (ref 0.50–1.10)
GFR calc Af Amer: 75 mL/min — ABNORMAL LOW (ref >90)
GFR calc non Af Amer: 64 mL/min — ABNORMAL LOW (ref >90)
GLUCOSE: 115 mg/dL — AB (ref 70–99)
Potassium: 3.8 mEq/L (ref 3.7–5.3)
Sodium: 136 mEq/L — ABNORMAL LOW (ref 137–147)

## 2014-01-19 LAB — GLUCOSE, CAPILLARY
GLUCOSE-CAPILLARY: 189 mg/dL — AB (ref 70–99)
GLUCOSE-CAPILLARY: 137 mg/dL — AB (ref 70–99)
Glucose-Capillary: 133 mg/dL — ABNORMAL HIGH (ref 70–99)

## 2014-01-19 LAB — CBC
HCT: 29.5 % — ABNORMAL LOW (ref 36.0–46.0)
HEMOGLOBIN: 9.3 g/dL — AB (ref 12.0–15.0)
MCH: 25.1 pg — AB (ref 26.0–34.0)
MCHC: 31.5 g/dL (ref 30.0–36.0)
MCV: 79.7 fL (ref 78.0–100.0)
Platelets: 296 10*3/uL (ref 150–400)
RBC: 3.7 MIL/uL — ABNORMAL LOW (ref 3.87–5.11)
RDW: 22.3 % — ABNORMAL HIGH (ref 11.5–15.5)
WBC: 6.5 10*3/uL (ref 4.0–10.5)

## 2014-01-19 SURGERY — ESOPHAGOGASTRODUODENOSCOPY (EGD) WITH PROPOFOL
Anesthesia: Monitor Anesthesia Care

## 2014-01-19 SURGERY — EGD (ESOPHAGOGASTRODUODENOSCOPY)
Anesthesia: Monitor Anesthesia Care

## 2014-01-19 MED ORDER — LACTATED RINGERS IV SOLN: INTRAVENOUS | Status: DC

## 2014-01-19 MED ORDER — PROPOFOL 10 MG/ML IV BOLUS
INTRAVENOUS | Status: AC
Start: 2014-01-19 — End: 2014-01-19
  Filled 2014-01-19: qty 20

## 2014-01-19 MED ORDER — LACTATED RINGERS IV SOLN
INTRAVENOUS | Status: DC
  Administered 2014-01-19: 11:00:00 via INTRAVENOUS

## 2014-01-19 MED ORDER — PROMETHAZINE HCL 25 MG/ML IJ SOLN: 6.2500 mg | INTRAMUSCULAR | Status: DC | PRN

## 2014-01-19 MED ORDER — PROPOFOL INFUSION 10 MG/ML OPTIME
INTRAVENOUS | Status: DC | PRN
  Administered 2014-01-19: 300 ug/kg/min via INTRAVENOUS

## 2014-01-19 MED ORDER — PANTOPRAZOLE SODIUM 40 MG PO TBEC
40.0000 mg | DELAYED_RELEASE_TABLET | Freq: Two times a day (BID) | ORAL | Status: DC
  Administered 2014-01-19 – 2014-01-21 (×4): 40 mg via ORAL
  Filled 2014-01-19 (×6): qty 1

## 2014-01-19 MED ORDER — SODIUM CHLORIDE 0.9 % IV SOLN: INTRAVENOUS | Status: DC

## 2014-01-19 MED ORDER — PROPOFOL 10 MG/ML IV BOLUS
INTRAVENOUS | Status: AC
  Filled 2014-01-19: qty 20

## 2014-01-19 SURGICAL SUPPLY — 14 items

## 2014-01-19 NOTE — Progress Notes (Signed)
OT Cancellation Note  Patient Details Name: Amanda [name] MRN: [id number] DOB: 1941/03/16   Cancelled Treatment:    Reason Eval/Treat Not Completed: Patient at procedure or test/ unavailable. Plans for EGD this AM and does not want therapy at this time. Will re attempt later today as time allows/as appropriate   Britt Bottom 01/19/2014, 9:39 AM

## 2014-01-19 NOTE — Progress Notes (Signed)
Patient ID: Amanda [name], female   DOB: 29-Jun-1941, 73 y.o.   MRN: [id number] 3 Days Post-Op  Subjective: Pt looks great.  Tolerating a solid diet.  Minimal pain  Objective: Vital signs in last 24 hours: Temp:  [97.9 F (36.6 C)-98.5 F (36.9 C)] 98.5 F (36.9 C) (06/19 0400) Pulse Rate:  [60-76] 67 (06/19 0435) Resp:  [18-22] 20 (06/19 0435) BP: (134-165)/(44-77) 140/51 mmHg (06/19 0435) SpO2:  [94 %-98 %] 95 % (06/19 0435) Weight:  [109 lb 8 oz (49.669 kg)] 109 lb 8 oz (49.669 kg) (06/19 0400) Last BM Date: 01/18/14  Intake/Output from previous day: 06/18 0701 - 06/19 0700 In: 896.7 [P.O.:420; I.V.:476.7] Out: 1575 [[id number]] Intake/Output this shift:    PE: Abd: soft, minimally tender, +BS, ND, incision c/d/i with staples  Lab Results:   Recent Labs  01/18/14 0315 01/19/14 0317  WBC 5.7 6.5  HGB 8.6* 9.3*  HCT 27.3* 29.5*  PLT 285 296   BMET  Recent Labs  01/18/14 0315 01/19/14 0317  NA 138 136*  K 3.6* 3.8  CL 103 97  CO2 26 29  GLUCOSE 142* 115*  BUN 4* 4*  CREATININE 0.82 0.86  CALCIUM 8.7 8.8   PT/INR No results found for this basename: LABPROT, INR,  in the last 72 hours CMP     Component Value Date/Time   NA 136* 01/19/2014 0317   K 3.8 01/19/2014 0317   CL 97 01/19/2014 0317   CO2 29 01/19/2014 0317   GLUCOSE 115* 01/19/2014 0317   BUN 4* 01/19/2014 0317   CREATININE 0.86 01/19/2014 0317   CALCIUM 8.8 01/19/2014 0317   PROT 6.3 01/08/2014 1311   ALBUMIN 3.4* 01/08/2014 1311   AST 15 01/08/2014 1311   ALT 12 01/08/2014 1311   ALKPHOS 55 01/08/2014 1311   BILITOT 0.3 01/08/2014 1311   GFRNONAA 64* 01/19/2014 0317   GFRAA 75* 01/19/2014 0317   Lipase     Component Value Date/Time   LIPASE 29 07/15/2010 2105       Studies/Results: No results found.  Anti-infectives: Anti-infectives   Start     Dose/Rate Route Frequency Ordered Stop   01/12/14 2200  cefoTEtan (CEFOTAN) 2 g in dextrose 5 % 50 mL IVPB     2 g 100 mL/hr over 30 Minutes  Intravenous Every 12 hours 01/12/14 1617 01/12/14 2249   01/12/14 2000  cefoTEtan (CEFOTAN) 1 g in dextrose 5 % 50 mL IVPB  Status:  Discontinued     1 g 100 mL/hr over 30 Minutes Intravenous Every 12 hours 01/11/14 1511 01/12/14 1617   01/12/14 0900  cefoTEtan (CEFOTAN) 1 g in dextrose 5 % 50 mL IVPB  Status:  Discontinued     1 g 100 mL/hr over 30 Minutes Intravenous Every 12 hours 01/11/14 1459 01/11/14 1511   01/12/14 0800  cefoTEtan (CEFOTAN) 2 g in dextrose 5 % 50 mL IVPB     2 g 100 mL/hr over 30 Minutes Intravenous On call 01/12/14 0713 01/12/14 1200   01/09/14 1600  fluconazole (DIFLUCAN) 40 MG/ML suspension 100 mg     100 mg Oral Daily 01/09/14 1333         Assessment/Plan   1. POD 7, s/p lap assisted right hemicolectomy and transanal excision of benign polyp  2. 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 COPD  4. PAF  Plan: 1. Pathology d/w patient yesterday 2. Will dc staples tomorrow 3. Patient surgically stable for dc home from our standpoint when  medically appropriate. 4. Dc instructions d/w patient and placed in the chart.  We will sign off and see the patient in 2-3 weeks for follow up.  LOS: 11 days    OSBORNE,KELLY E 01/19/2014, 7:46 AM Pager: [phone]

## 2014-01-19 NOTE — Progress Notes (Signed)
PROGRESS NOTE  Amanda [name] VOJ:[id number] DOB: 1941/03/30 DOA: 01/08/2014 PCP: Tamsen Roers, MD  HPI: 73 y.o. female with a PMH of COPD emphysema, hypertension, type 2 diabetes mellitus, GERD, hyperlipidemia, seen in ED 1 week prior to this presentation for COPD and at which time CT angio chest was negative for PE. At that time she was found to have Hgb of 7.5 and was given oral iron supplementations and subsequently discharged home. Patient was admitted 01/08/14 with complaints of fatigue, generalized weakness, and a four-week history of increasing shortness of breath. She was found to have hemoglobin of 6.5 and patient additionally reported black stools, unintentional 10 pound weight loss which pt attributed to ongoing dysphagia for past 1 month PTA. Hospital course was complicated due to finding of cecal mass on colonoscopy confirmed to be colon cancer. Pt underwent laparoscopic-assisted right colectomy and transanal excision of rectal polyp 01/12/2014. She was scheduled for EGD 01/16/2014 but prior to procedure went in to A fib requiring transfer to SDU. Cardiology was consulted.  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 She has received 2 units PRBC transfusion with post-transfusion Hgb 9.5. Hgb stable at 8.5 - 9.5  - Status post colonoscopy 01/10/2014 with findings of cecal mass and multiple polyps. Pt underwent laparoscopic-assisted right colectomy and transanal excision of rectal polyp  01/12/2014. Due to concern for possible difficult extubation she was transferred to SDU after surgery. She was doing quite good after surgery and has had no complications since.  - Appreciate surgery and GI following.  - Staging CT of abdomen and pelvis done 01/10/14, no evidence of mets. Had CT chest done already on 01/01/14, so would not repeat.  - surgery s/o 6/19  New onset atrial fibrillation  - Patient was scheduled for endoscopy however procedure not done because patient went into atrial fibrillation on 6/16.  - Order placed for 2-D echo. - cardiology consulted, appreciate input.  - in sinus now, continue metop. - cardiology signed off 6/19  Hypertension  - Continue Norvasc 5 mg daily.  - Pt also on hydralazine PRN, 10 mg IV every 6 hours PRN  COPD (chronic obstructive pulmonary disease)  - Stable, pulmonary is following  GERD (gastroesophageal reflux disease)  - Protonix drip stopped prior to surgery 01/12/2014  Type II or unspecified type diabetes mellitus without mention of complication, uncontrolled  - Continue insulin sensitive sliding scale.    Diet: NPO Fluids: none DVT Prophylaxis: heparin s.q.  Code Status: Full Family Communication: d/w patient  Disposition Plan: inpatient  Consultants:  Cardiology  PCCM  Surgery   Gastroenterology   Procedures: EGD 01/09/14: Tight peptic stricture in the distal esophagus, s/p balloon dilation to 10 mm; Candida esophagitis.  Colonoscopy 01/10/14: 1. Bleeding malignant tumor/mass at the ileocecal valve; multiple biopsies were performed 2. Sessile polyp measuring 2-3 cm in size was found at the hepatic flexure - not removed 3. Sessile polyp measuring 6 mm in size was found at the hepatic flexure -  not removed 4. Two sessile polyps measuring 3 and 4 mm in size were found in the transverse colon; polypectomy was performed 5. Sessile polyp measuring 3 mm in size was found in the descending colon; polypectomy was performed 6. Sessile  polyp measuring 2 cm in size was found in the rectum; multiple biopsies were performed - not removed 7. There was severe diverticulosis noted in the sigmoid colon  Laparoscopic-assisted right colectomy. 2. Transanal excision of rectal polyp 01/12/2014   Antibiotics Cefotetan pre-op Fluconazole 6/9 >>  HPI/Subjective: No complaints, she is hungry   Objective: Filed Vitals:   01/19/14 0400 01/19/14 0435 01/19/14 0809 01/19/14 0918  BP:  140/51  136/55  Pulse: 64 67    Temp: 98.5 F (36.9 C)  98.6 F (37 C)   TempSrc: Oral  Oral   Resp: 21 20    Height:      Weight: 49.669 kg (109 lb 8 oz)     SpO2: 95% 95% 95%     Intake/Output Summary (Last 24 hours) at 01/19/14 1013 Last data filed at 01/19/14 0800  Gross per 24 hour  Intake    220 ml  Output   1776 ml  Net  -1556 ml   Filed Weights   01/13/14 1110 01/18/14 0400 01/19/14 0400  Weight: 49.578 kg (109 lb 4.8 oz) 54.205 kg (119 lb 8 oz) 49.669 kg (109 lb 8 oz)   Exam:  General:  NAD  Cardiovascular: regular rate and rhythm, without MRG  Respiratory: good air movement, clear to auscultation throughout, no wheezing, ronchi or rales  Abdomen: soft, positive bowel sounds  MSK: no peripheral edema  Neuro: non focal  Data Reviewed: Basic Metabolic Panel:  Recent Labs Lab 01/16/14 1010 01/16/14 1455 01/17/14 0814 01/18/14 0315 01/19/14 0317  NA 137 140 138 138 136*  K 3.2* 3.0* 3.7 3.6* 3.8  CL 100 103 102 103 97  CO2 27 29 27 26 29   GLUCOSE 195* 148* 162* 142* 115*  BUN 5* 4* 3* 4* 4*  CREATININE 0.70 0.67 0.75 0.82 0.86  CALCIUM 8.6 8.7 8.6 8.7 8.8   CBC:  Recent Labs Lab 01/13/14 0313 01/14/14 0555 01/16/14 1010 01/18/14 0315 01/19/14 0317  WBC 30.0* 14.8* 7.2 5.7 6.5  NEUTROABS  --  13.3*  --   --   --   HGB 9.2* 8.5* 9.5* 8.6* 9.3*  HCT 29.4* 26.7* 29.9* 27.3* 29.5*  MCV 81.4 82.2 81.0 80.5 79.7  PLT 292 229 240 285 296   BNP (last 3 results)  Recent Labs  01/01/14 1100  PROBNP  1711.0*   CBG:  Recent Labs Lab 01/18/14 0806 01/18/14 1134 01/18/14 1706 01/18/14 2139 01/19/14 0811  GLUCAP 177* 269* 173* 132* 133*    Recent Results (from the past 240 hour(s))  SURGICAL PCR SCREEN     Status: None   Collection Time    01/12/14  7:23 AM      Result Value Ref Range Status   MRSA, PCR NEGATIVE  NEGATIVE Final   Staphylococcus aureus NEGATIVE  NEGATIVE Final   Comment:            The Xpert SA Assay (FDA     approved for NASAL specimens     in patients over 47 years of age),     is one component of     a comprehensive surveillance     program.  Test performance has     been validated by [employer] [facility] for patients greater     than or equal to 8 year old.     It is not intended     to diagnose infection nor to     guide or monitor treatment.   Studies: No results found.  Scheduled Meds: . amLODipine  5 mg Oral Daily  . arformoterol  15 mcg Nebulization Q12H  . budesonide (PULMICORT) nebulizer solution  0.25 mg Nebulization Q12H  . docusate sodium  100 mg Oral BID  . feeding supplement (RESOURCE BREEZE)  1 Container Oral TID BM  . fluconazole  100 mg Oral Daily  . heparin subcutaneous  5,000 Units Subcutaneous 3 times per day  . insulin aspart  0-5 Units Subcutaneous QHS  . insulin aspart  0-9 Units Subcutaneous TID WC  . metoprolol tartrate  25 mg Oral BID  . pantoprazole (PROTONIX) IV  40 mg Intravenous Q12H  . polyethylene glycol  17 g Oral BID   Continuous Infusions:   Principal Problem:   Anemia Active Problems:   GI bleed   Dysphagia   COPD (chronic obstructive pulmonary disease)   GERD (gastroesophageal reflux disease)   Hyperlipidemia   Type II or unspecified type diabetes mellitus without mention of complication, uncontrolled   Essential hypertension, benign   Stricture and stenosis of esophagus   Candida esophagitis   Colon cancer   Benign neoplasm of colon   Diverticulosis of colon (without mention of hemorrhage)    Diverticulosis of colon without hemorrhage   Malignant hypertension   Pre-operative respiratory examination   COPD with emphysema  Time spent: 25  This note has been created with Surveyor, quantity. Any transcriptional errors are unintentional.   Marzetta Board, MD [facility] Pager [phone]. If 7 PM - 7 AM, please contact night-coverage at [email or web address], password [facility] 01/19/2014, 10:13 AM  LOS: 11 days

## 2014-01-19 NOTE — Transfer of Care (Signed)
Immediate Anesthesia Transfer of Care Note  Patient: Amanda [name]  Procedure(s) Performed: Procedure(s): ESOPHAGOGASTRODUODENOSCOPY (EGD) WITH PROPOFOL (N/A)  Patient Location: PACU and Endoscopy Unit  Anesthesia Type:MAC  Level of Consciousness: awake and patient cooperative  Airway & Oxygen Therapy: Patient Spontanous Breathing and Patient connected to nasal cannula oxygen  Post-op Assessment: Report given to PACU RN and Post -op Vital signs reviewed and stable  Post vital signs: Reviewed and stable  Complications: No apparent anesthesia complications

## 2014-01-19 NOTE — Op Note (Signed)
[facility] [place], [address]   ENDOSCOPY PROCEDURE REPORT  PATIENT: Amanda, [name]  MR#: [id number] BIRTHDATE: Aug 04, 1940 , 39  yrs. old GENDER: Female ENDOSCOPIST: Gatha Mayer, MD, Marval Regal ASSISTANT:   Cherylynn Ridges, technician Luanne Bras, RN CGRN PROCEDURE DATE:  01/19/2014 PROCEDURE:   EGD with balloon dilatation ASA CLASS:   Class III INDICATIONS:dysphagia and dilate esophageal stricture MEDICATIONS: See Anesthesia Report. TOPICAL ANESTHETIC:   none  DESCRIPTION OF PROCEDURE:   After the risks benefits and alternatives of the procedure were thoroughly explained, informed consent was obtained.  The     endoscope was introduced through the mouth  and advanced to the second portion of the duodenum , The instrument was slowly withdrawn as the mucosa was carefully examined.   ESOPHAGUS: A stricture was found in the lower third of the esophagus.  The stenosis was non traversable with the endoscope.  The remainder of the upper endoscopy exam was otherwise normal. Dilation was performed at the distal esophagus  Dilator:Balloon Size:10,11,12 mm  Reststance:moderate Heme:yes Appearance:satisfactory  scope passed after dilation  COMPLICATIONS: There were no complications. ENDOSCOPIC IMPRESSION: 1.   Stricture was found in the lower third of the esophagus - looks benign, dilated to 12 mm 2.   The remainder of the upper endoscopy exam was otherwise normal.   RECOMMENDATIONS: clears then full liquids today if ok soft diet tomorrow if ok depending upon when she goes home could need another dilation here vs.  outpatient. Will recheck on her next week bid PPI po   eSigned:  Gatha Mayer, MD, [facility] 01/19/2014 12:48 PM  CC:The Patient

## 2014-01-19 NOTE — Anesthesia Preprocedure Evaluation (Addendum)
Anesthesia Evaluation  Patient identified by MRN, date of birth, ID band Patient awake  General Assessment Comment: Emphysema lung     .  Hyperlipidemia     .  GERD (gastroesophageal reflux disease)     .  Vitamin D deficiency     .  Closed fracture of unspecified part of upper end of humerus     .  Complete rupture of rotator cuff     .  Other vitamin B12 deficiency anemia     .  Neurogenic bladder, NOS     .  COPD (chronic obstructive pulmonary disease)     .  Type II or unspecified type diabetes mellitus without mention of complication, uncontrolled         diet controlled   .  Essential hypertension, benign         off lisinporil for last 2 months     Reviewed: Allergy & Precautions, H&P , NPO status , Patient's Chart, lab work & pertinent test results  Airway Mallampati: II TM Distance: >3 FB Neck ROM: Full    Dental no notable dental hx. (+) Edentulous Upper, Edentulous Lower   Pulmonary COPD COPD inhaler, former smoker,  breath sounds clear to auscultation  Pulmonary exam normal       Cardiovascular Exercise Tolerance: Good hypertension, + dysrhythmias Atrial Fibrillation Rhythm:Regular Rate:Normal  Echo 01/17/2014 - Left ventricle: The cavity size was normal. Systolic function was  normal. The estimated ejection fraction was in the range of 50%  to 55%. Wall motion was normal; there were no regional wall  motion abnormalities. Left ventricular diastolic function  parameters were normal. - Aortic valve: Trileaflet; normal thickness leaflets. There was no  regurgitation. - Mitral valve: Mildly thickened leaflets . There was mild   regurgitation. - Right ventricle: Systolic function was normal. - Right atrium: The atrium was normal in size. - Tricuspid valve: There was mild regurgitation. - Pulmonary arteries: Systolic pressure was within the normal  Range. - Pericardium, extracardiac: There was no pericardial  effusion.  Impressions: - Normal biventricular size and function. Mild mitral and tricuspid regurgitation.   Neuro/Psych negative neurological ROS  negative psych ROS   GI/Hepatic Neg liver ROS, GERD-  Medicated,  Endo/Other  diabetes, Type 2  Renal/GU negative Renal ROS  negative genitourinary   Musculoskeletal negative musculoskeletal ROS (+)   Abdominal   Peds negative pediatric ROS (+)  Hematology  (+) anemia , Hgb 9.2   Anesthesia Other Findings   Reproductive/Obstetrics negative OB ROS                         Anesthesia Physical  Anesthesia Plan  ASA: III  Anesthesia Plan: MAC   Post-op Pain Management:    Induction: Intravenous  Airway Management Planned: Nasal Cannula  Additional Equipment:   Intra-op Plan:   Post-operative Plan: Extubation in OR  Informed Consent: I have reviewed the patients History and Physical, chart, labs and discussed the procedure including the risks, benefits and alternatives for the proposed anesthesia with the patient or authorized representative who has indicated his/her understanding and acceptance.   Dental advisory given  Plan Discussed with: CRNA  Anesthesia Plan Comments:         Anesthesia Quick Evaluation

## 2014-01-19 NOTE — Progress Notes (Signed)
    Subjective:  Denies CP; mild dyspea   Objective:  Filed Vitals:   01/19/14 0135 01/19/14 0400 01/19/14 0435 01/19/14 0809  BP: 139/65  140/51   Pulse: 72 64 67   Temp:  98.5 F (36.9 C)  98.6 F (37 C)  TempSrc:  Oral  Oral  Resp: 18 21 20    Height:      Weight:  109 lb 8 oz (49.669 kg)    SpO2: 95% 95% 95% 95%    Intake/Output from previous day:  Intake/Output Summary (Last 24 hours) at 01/19/14 0824 Last data filed at 01/19/14 7829  Gross per 24 hour  Intake 696.67 ml  Output   1575 ml  Net -878.33 ml    Physical Exam: Physical exam: Well-developed well-nourished in no acute distress.  Skin is warm and dry.  HEENT is normal.  Neck is supple.  Chest with mildly diminished BS and exp wheeze Cardiovascular exam is regular rate and rhythm.  Abdominal exam s/p abdominal surgery Extremities show trace ankle edema. neuro grossly intact    Lab Results: Basic Metabolic Panel:  Recent Labs  01/18/14 0315 01/19/14 0317  NA 138 136*  K 3.6* 3.8  CL 103 97  CO2 26 29  GLUCOSE 142* 115*  BUN 4* 4*  CREATININE 0.82 0.86  CALCIUM 8.7 8.8   CBC:  Recent Labs  01/18/14 0315 01/19/14 0317  WBC 5.7 6.5  HGB 8.6* 9.3*  HCT 27.3* 29.5*  MCV 80.5 79.7  PLT 285 296     Assessment/Plan:  1 postoperative atrial fibrillation-the patient remains in sinus rhythm. Echo with normal LV function; TSH normal. Continue metoprolol. Would not anticoagulate unless atrial fibrillation recurs. Atrial fibrillation most likely related to hyperadrenergic state associated with surgery. 2 colon cancer status post resection-management per general surgery. 3 dysphagia-awaiting EGD. 4 COPD 5 Postop volume excess-improved We will sign off. Please have patient fu with Dr Radford Pax 2-4 weeks following DC  Kirk Ruths 01/19/2014, 8:24 AM

## 2014-01-19 NOTE — Progress Notes (Signed)
PT Cancellation Note  Patient Details Name: Amanda [name] MRN: [id number] DOB: 07/01/1941   Cancelled Treatment:    Reason Eval/Treat Not Completed: Patient at procedure or test/unavailable (plans for EGD this AM and does not want PT at this time. check back later as schedule permits.)   Claretha Cooper 01/19/2014, 8:55 AM

## 2014-01-19 NOTE — Anesthesia Postprocedure Evaluation (Signed)
Anesthesia Post Note  Patient: Amanda [name]  Procedure(s) Performed: Procedure(s) (LRB): ESOPHAGOGASTRODUODENOSCOPY (EGD) WITH PROPOFOL (N/A)  Anesthesia type: MAC  Patient location: PACU  Post pain: Pain level controlled  Post assessment: Post-op Vital signs reviewed  Last Vitals: BP 129/64  Pulse 62  Temp(Src) 36.4 C (Oral)  Resp 16  Ht 5\' 1"  (1.549 m)  Wt 109 lb 8 oz (49.669 kg)  BMI 20.70 kg/m2  SpO2 97%  Post vital signs: Reviewed  Level of consciousness: awake  Complications: No apparent anesthesia complications

## 2014-01-19 NOTE — OR Nursing (Addendum)
PIV saline locked. Pt awake, alert and ready to transport to room 1408.  Report called to Mariane Duval RN.  Addendum: patient transported to room 1231 with report being given to [employer]

## 2014-01-20 LAB — GLUCOSE, CAPILLARY
GLUCOSE-CAPILLARY: 221 mg/dL — AB (ref 70–99)
Glucose-Capillary: 129 mg/dL — ABNORMAL HIGH (ref 70–99)
Glucose-Capillary: 218 mg/dL — ABNORMAL HIGH (ref 70–99)
Glucose-Capillary: 213 mg/dL — ABNORMAL HIGH (ref 70–99)

## 2014-01-20 NOTE — Progress Notes (Signed)
PROGRESS NOTE  Amanda [name] TKZ:[id number] DOB: [birth year] DOA: 01/08/2014 PCP: Tamsen Roers, MD  HPI: 73 y.o. female with a PMH of COPD emphysema, hypertension, type 2 diabetes mellitus, GERD, hyperlipidemia, seen in ED 1 week prior to this presentation for COPD and at which time CT angio chest was negative for PE. At that time she was found to have Hgb of 7.5 and was given oral iron supplementations and subsequently discharged home. Patient was admitted 01/08/14 with complaints of fatigue, generalized weakness, and a four-week history of increasing shortness of breath. She was found to have hemoglobin of 6.5 and patient additionally reported black stools, unintentional 10 pound weight loss which pt attributed to ongoing dysphagia for past 1 month PTA. Hospital course was complicated due to finding of cecal mass on colonoscopy confirmed to be colon cancer. Pt underwent laparoscopic-assisted right colectomy and transanal excision of rectal polyp 01/12/2014. She was scheduled for EGD 01/16/2014 but prior to procedure went in to A fib requiring transfer to SDU. Cardiology was consulted.  Assessment/Plan:  Dysphagia secondary to esophageal stricture / candida esophagitis  - EGD done 01/09/14 with findings of tight peptic stricture in distal esophagus, s/p balloon dilation to 10 mm, candida esophagitis, plan for repeat EGD per GI to dilate again - Was on protonix drip until prior to surgery  - Continue Diflucan 100 mg daily - repeat EGD 6/19 with additional dilatation, will advance diet to soft today and monitor.   Acute on chronic anemia secondary to GI bleeding in the setting of newly diagnosed colon cancer  - Pt initially admitted to SDU. She has received 2 units PRBC transfusion with post-transfusion Hgb 9.5. Hgb stable at 8.5 - 9.5  - Status post colonoscopy 01/10/2014 with findings of cecal mass and multiple polyps. Pt underwent laparoscopic-assisted right colectomy and transanal excision of rectal  polyp 01/12/2014. Due to concern for possible difficult extubation she was transferred to SDU after surgery. She was doing quite good after surgery and has had no complications since.  - Appreciate surgery and GI following.  - Staging CT of abdomen and pelvis done 01/10/14, no evidence of mets. Had CT chest done already on 01/01/14, so would not repeat.  - surgery s/o 6/19 - will refer to oncology as an outpatient.   New onset atrial fibrillation  - Patient was scheduled for endoscopy however procedure not done because patient went into atrial fibrillation on 6/16.  - in sinus now, continue metop. - cardiology signed off 6/19  Hypertension  - Continue Norvasc 5 mg daily.  - Pt also on hydralazine PRN, 10 mg IV every 6 hours PRN  COPD (chronic obstructive pulmonary disease)  - Stable, pulmonary is following  GERD (gastroesophageal reflux disease)  - Protonix drip stopped prior to surgery 01/12/2014  Type II or unspecified type diabetes mellitus without mention of complication, uncontrolled  - Continue insulin sensitive sliding scale.    Diet: NPO Fluids: none DVT Prophylaxis: heparin s.q.  Code Status: Full Family Communication: d/w patient  Disposition Plan: inpatient  Consultants:  Cardiology  PCCM  Surgery   Gastroenterology   Procedures: EGD 01/09/14: Tight peptic stricture in the distal esophagus, s/p balloon dilation to 10 mm; Candida esophagitis.  Colonoscopy 01/10/14: 1. Bleeding malignant tumor/mass at the ileocecal valve; multiple biopsies were performed 2. Sessile polyp measuring 2-3 cm in size was found at the hepatic flexure - not removed 3. Sessile polyp measuring 6 mm in size was found at the hepatic flexure - not removed 4. Two  sessile polyps measuring 3 and 4 mm in size were found in the transverse colon; polypectomy was performed 5. Sessile polyp measuring 3 mm in size was found in the descending colon; polypectomy was performed 6. Sessile polyp measuring 2 cm in  size was found in the rectum; multiple biopsies were performed - not removed 7. There was severe diverticulosis noted in the sigmoid colon  Laparoscopic-assisted right colectomy. 2. Transanal excision of rectal polyp 01/12/2014   Antibiotics Cefotetan pre-op Fluconazole 6/9 >>  HPI/Subjective: She is tired of broth all the time.    Objective: Filed Vitals:   01/19/14 2100 01/19/14 2208 01/20/14 0500 01/20/14 0905  BP:  131/70 126/65   Pulse:  68 60   Temp:  98.1 F (36.7 C) 98.3 F (36.8 C)   TempSrc:   Oral   Resp:  20 18   Height:      Weight:      SpO2: 97% 97% 98% 97%    Intake/Output Summary (Last 24 hours) at 01/20/14 1034 Last data filed at 01/20/14 0912  Gross per 24 hour  Intake    340 ml  Output    350 ml  Net    -10 ml   Filed Weights   01/13/14 1110 01/18/14 0400 01/19/14 0400  Weight: 49.578 kg (109 lb 4.8 oz) 54.205 kg (119 lb 8 oz) 49.669 kg (109 lb 8 oz)   Exam:  General:  NAD  Cardiovascular: regular rate and rhythm, without MRG  Respiratory: good air movement, clear to auscultation throughout, no wheezing, ronchi or rales  Abdomen: soft, positive bowel sounds  MSK: no peripheral edema  Neuro: non focal  Data Reviewed: Basic Metabolic Panel:  Recent Labs Lab 01/16/14 1010 01/16/14 1455 01/17/14 0814 01/18/14 0315 01/19/14 0317  NA 137 140 138 138 136*  K 3.2* 3.0* 3.7 3.6* 3.8  CL 100 103 102 103 97  CO2 27 29 27 26 29   GLUCOSE 195* 148* 162* 142* 115*  BUN 5* 4* 3* 4* 4*  CREATININE 0.70 0.67 0.75 0.82 0.86  CALCIUM 8.6 8.7 8.6 8.7 8.8   CBC:  Recent Labs Lab 01/14/14 0555 01/16/14 1010 01/18/14 0315 01/19/14 0317  WBC 14.8* 7.2 5.7 6.5  NEUTROABS 13.3*  --   --   --   HGB 8.5* 9.5* 8.6* 9.3*  HCT 26.7* 29.9* 27.3* 29.5*  MCV 82.2 81.0 80.5 79.7  PLT 229 240 285 296   BNP (last 3 results)  Recent Labs  01/01/14 1100  PROBNP 1711.0*   CBG:  Recent Labs Lab 01/18/14 2139 01/19/14 0811 01/19/14 1535  01/19/14 2201 01/20/14 0804  GLUCAP 132* 133* 189* 137* 129*    Recent Results (from the past 240 hour(s))  SURGICAL PCR SCREEN     Status: None   Collection Time    01/12/14  7:23 AM      Result Value Ref Range Status   MRSA, PCR NEGATIVE  NEGATIVE Final   Staphylococcus aureus NEGATIVE  NEGATIVE Final   Comment:            The Xpert SA Assay (FDA     approved for NASAL specimens     in patients over 85 years of age),     is one component of     a comprehensive surveillance     program.  Test performance has     been validated by [employer] for patients greater     than or equal to  5 year old.     It is not intended     to diagnose infection nor to     guide or monitor treatment.   Studies: No results found.  Scheduled Meds: . amLODipine  5 mg Oral Daily  . arformoterol  15 mcg Nebulization Q12H  . budesonide (PULMICORT) nebulizer solution  0.25 mg Nebulization Q12H  . docusate sodium  100 mg Oral BID  . feeding supplement (RESOURCE BREEZE)  1 Container Oral TID BM  . heparin subcutaneous  5,000 Units Subcutaneous 3 times per day  . insulin aspart  0-5 Units Subcutaneous QHS  . insulin aspart  0-9 Units Subcutaneous TID WC  . metoprolol tartrate  25 mg Oral BID  . pantoprazole  40 mg Oral BID AC  . polyethylene glycol  17 g Oral BID   Continuous Infusions:   Principal Problem:   Anemia Active Problems:   GI bleed   Dysphagia   COPD (chronic obstructive pulmonary disease)   GERD (gastroesophageal reflux disease)   Hyperlipidemia   Type II or unspecified type diabetes mellitus without mention of complication, uncontrolled   Essential hypertension, benign   Stricture and stenosis of esophagus   Candida esophagitis   Colon cancer   Benign neoplasm of colon   Diverticulosis of colon (without mention of hemorrhage)   Diverticulosis of colon without hemorrhage   Malignant hypertension   Pre-operative respiratory examination   COPD with emphysema  Time  spent: 15  This note has been created with Surveyor, quantity. Any transcriptional errors are unintentional.   Marzetta Board, MD [facility] Pager [phone]. If 7 PM - 7 AM, please contact night-coverage at [email or web address], password [facility] 01/20/2014, 10:34 AM  LOS: 12 days

## 2014-01-21 LAB — GLUCOSE, CAPILLARY: GLUCOSE-CAPILLARY: 137 mg/dL — AB (ref 70–99)

## 2014-01-21 MED ORDER — METOPROLOL TARTRATE 25 MG PO TABS: 25.0000 mg | ORAL_TABLET | Freq: Two times a day (BID) | ORAL | Status: DC

## 2014-01-21 MED ORDER — OMEPRAZOLE 40 MG PO CPDR: 40.0000 mg | DELAYED_RELEASE_CAPSULE | Freq: Two times a day (BID) | ORAL | Status: DC

## 2014-01-21 MED ORDER — BOOST / RESOURCE BREEZE PO LIQD: 1.0000 | Freq: Three times a day (TID) | ORAL | Status: DC

## 2014-01-21 MED ORDER — METFORMIN HCL 500 MG PO TABS: 500.0000 mg | ORAL_TABLET | Freq: Two times a day (BID) | ORAL | Status: DC

## 2014-01-21 MED ORDER — AMLODIPINE BESYLATE 5 MG PO TABS: 5.0000 mg | ORAL_TABLET | Freq: Every day | ORAL | Status: DC

## 2014-01-21 NOTE — Progress Notes (Signed)
Physical Therapy Treatment Patient Details Name: Amanda [name] MRN: [id number] DOB: [birth year] Today's Date: 01/21/2014    History of Present Illness Pt admitted with colon CA and underwent a R hemicolectomy.  Pt with new onset afib on 6/16 and was converted back to normal rythm.  Pt with PMH of emphysema, Gerd, R humerous fx w surgery and full rotator cuff rupture and COPD.    PT Comments    Pt progressing very well.  Pt hopeful to d/c home today so agreeable to ambulate and practiced 3 steps.  A little unsteady at times with challenges to balance during gait however no LOB therefore recommend initial supervision upon d/c.   Follow Up Recommendations  Home health PT;Supervision for mobility/OOB     Equipment Recommendations  None recommended by PT    Recommendations for Other Services       Precautions / Restrictions Precautions Precautions: Fall    Mobility  Bed Mobility Overal bed mobility: Modified Independent             General bed mobility comments: pt up in recliner on arrival  Transfers Overall transfer level: Needs assistance   Transfers: Sit to/from Stand Sit to Stand: Supervision Stand pivot transfers: Supervision       General transfer comment: performs safely  Ambulation/Gait Ambulation/Gait assistance: Min guard Ambulation Distance (Feet): 300 Feet Assistive device: None Gait Pattern/deviations: Step-through pattern;Narrow base of support;Decreased stride length     General Gait Details: pt a little unsteady with head turns, stop/start, and turning in a circle however no LOB, dyspnea 2/4 with ambulation requiring 2 standing rest breaks, pt agreeable to use RW at home if feeling unsteady and/or very SOB   Stairs Stairs: Yes Stairs assistance: Min guard Stair Management: Step to pattern;Forwards;One rail Right Number of Stairs: 3 General stair comments: step to pattern due to SOB, increased time, min/guard for safety  Wheelchair Mobility    Modified Rankin (Stroke Patients Only)       Balance Overall balance assessment: No apparent balance deficits (not formally assessed)                                  Cognition Arousal/Alertness: Awake/alert Behavior During Therapy: WFL for tasks assessed/performed Overall Cognitive Status: Within Functional Limits for tasks assessed                      Exercises Other Exercises Other Exercises: general strengthening ex    General Comments        Pertinent Vitals/Pain Denies pain, 2/4 dyspnea with gait, SpO2 93% room air during gait    Home Living                      Prior Function            PT Goals (current goals can now be found in the care plan section) Acute Rehab PT Goals Patient Stated Goal: to go home Progress towards PT goals: Progressing toward goals    Frequency  Min 3X/week    PT Plan Current plan remains appropriate    Co-evaluation             End of Session   Activity Tolerance: Patient tolerated treatment well Patient left: in chair;with call bell/phone within reach     Time: 1253-1303 PT Time Calculation (min): 10 min  Charges:  $Gait Training: 8-22 mins  G Codes:      LEMYRE,KATHrine E 01-25-2014, 1:43 PM Carmelia Bake, PT, DPT 25-Jan-2014 Pager: [phone]

## 2014-01-21 NOTE — Progress Notes (Signed)
Occupational Therapy Treatment Patient Details Name: Amanda [name] MRN: [id number] DOB: 1940/08/09 Today's Date: 01/21/2014    History of present illness Pt admitted with colon CA and underwent a R hemicolectomy.  Pt with new onset afib on 6/16 and was converted back to normal rythm.  Pt with PMH of emphysema, Gerd, R humerous fx w surgery and full rotator cuff rupture and COPD.   OT comments  Pt making excellent progress. Pt completing ADL and mobility @ S level. 2/4 dyspnea. O2 94-96 RA. Pt will have 24/7 S after D/C. Pt appropriate for D/C home when medically stable with initial 24/7 S and HHOT/PT. Pt very motivated to progress. No DME needs.   Follow Up Recommendations  Home health OT;Supervision/Assistance - 24 hour    Equipment Recommendations  None recommended by OT    Recommendations for Other Services      Precautions / Restrictions Precautions Precautions: Fall Restrictions Weight Bearing Restrictions: No       Mobility Bed Mobility Overal bed mobility: Modified Independent                Transfers Overall transfer level: Needs assistance   Transfers: Sit to/from Stand;Stand Pivot Transfers Sit to Stand: Supervision Stand pivot transfers: Supervision            Balance Overall balance assessment: No apparent balance deficits (not formally assessed)                                 ADL Overall ADL's : Needs assistance/impaired             Lower Body Bathing: Supervison/ safety;Sit to/from stand   Upper Body Dressing : Set up   Lower Body Dressing: Supervision/safety;Sit to/from stand   Toilet Transfer: Supervision/safety;Ambulation;Comfort height toilet   Toileting- Clothing Manipulation and Hygiene: Supervision/safety;Sit to/from stand       Functional mobility during ADLs: Supervision/safety;Rolling walker General ADL Comments: Discussed E conservation. home safety. Pt verbalized understanidng.      Vision                      Perception     Praxis      Cognition   Behavior During Therapy: WFL for tasks assessed/performed Overall Cognitive Status: Within Functional Limits for tasks assessed                       Extremity/Trunk Assessment               Exercises Other Exercises Other Exercises: general strengthening ex   Shoulder Instructions       General Comments      Pertinent Vitals/ Pain       O2 94-96 RA. No c/o pain  Home Living                                          Prior Functioning/Environment              Frequency Min 2X/week     Progress Toward Goals  OT Goals(current goals can now be found in the care plan section)  Progress towards OT goals: Progressing toward goals  Acute Rehab OT Goals Patient Stated Goal: to go home OT Goal Formulation: With patient Time For Goal Achievement: 01/31/14 Potential to Achieve Goals: Good ADL  Goals Pt Will Perform Grooming: with modified independence;standing Pt Will Perform Lower Body Bathing: with supervision;sit to/from stand Pt Will Perform Lower Body Dressing: with supervision;sit to/from stand Pt Will Perform Tub/Shower Transfer: with supervision;ambulating;shower seat;Shower transfer;rolling walker Additional ADL Goal #1: Pt will complete all toileting with 3:1 over top of commode with S Additional ADL Goal #2: Pt will state 3 energy conservation techniques she can implement at home to save energy during adls.  Plan Discharge plan needs to be updated    Co-evaluation                 End of Session Equipment Utilized During Treatment: Gait belt;Rolling walker   Activity Tolerance Patient tolerated treatment well   Patient Left in chair;with call bell/phone within reach   Nurse Communication Mobility status        Time: 4360-6770 OT Time Calculation (min): 23 min  Charges: OT General Charges $OT Visit: 1 Procedure OT Treatments $Self Care/Home  Management : 23-37 mins  WARD,HILLARY 01/21/2014, 12:46 PM   [facility], OTR/L  [phone] 01/21/2014

## 2014-01-21 NOTE — [place] Notes (Signed)
CARE MANAGEMENT NOTE 01/21/2014  Patient:  Amanda [name], Amanda [name]   Account Number:  [id number]  Date Initiated:  01/16/2014  Documentation initiated by:  DAVIS,RHONDA  Subjective/Objective Assessment:   pt with hx of gi bleed/Low hemoglobin of 7.5,PMH of COPD emphysema, hypertension, type 2 diabetes mellitus, GERD, hyperlipidemia who was admitted 01/08/14 after presenting to the ER with complaints of fatigue, generalized weakness, and a four     Action/Plan:   home   Anticipated DC Date:  01/19/2014   Anticipated DC Plan:  [place]  CM consult      [facility] Choice  HOME HEALTH   Choice offered to / List presented to:  C-1 Patient        [place] arranged  [place].   Status of service:  Completed, signed off Medicare Important Message given?  NA - LOS <3 / Initial given by admissions (If response is "NO", the following Medicare IM given date [place] will be blank) Date Medicare IM given:  01/16/2014 Date Additional Medicare IM given:  01/21/2014  Discharge Disposition:  [place]  Per UR Regulation:  Reviewed for med. necessity/level of care/duration of stay  If discussed at [place] of Stay Meetings, dates discussed:    Comments:  01/21/2014 1500 NCM spoke to pt and offered choice for [facility]. Pt agreeable to [facility] for HH. States she had them in the past. Notified [facility] for [facility]. Pt states she has RW at home. Jonnie Finner RN CCM Case Mgmt phone [phone]  [id number] Rosana Hoes, RN, BSN, CCM: CHART REVIEWED AND UPDATED.  Next chart review due on [id number]. NO DISCHARGE NEEDS PRESENT AT THIS TIME WILL CONTINUE TO FOLLOW. CASE MANAGEMENT [phone]    [id number]/Rhonda Davis,RnBSN,CCM

## 2014-01-21 NOTE — [place] Notes (Signed)
CARE MANAGEMENT NOTE 01/21/2014  Patient:  Amanda [name], Amanda [name]   Account Number:  [id number]  Date Initiated:  01/16/2014  Documentation initiated by:  DAVIS,RHONDA  Subjective/Objective Assessment:   pt with hx of gi bleed/Low hemoglobin of 7.5,PMH of COPD emphysema, hypertension, type 2 diabetes mellitus, GERD, hyperlipidemia who was admitted 01/08/14 after presenting to the ER with complaints of fatigue, generalized weakness, and a four     Action/Plan:   home   Anticipated DC Date:  01/19/2014   Anticipated DC Plan:  [place]  CM consult      [facility] Choice  HOME HEALTH   Choice offered to / List presented to:  C-1 Patient        [place] arranged  [place].   Status of service:  Completed, signed off Medicare Important Message given?  NA - LOS <3 / Initial given by admissions (If response is "NO", the following Medicare IM given date [place] will be blank) Date Medicare IM given:  01/16/2014 Date Additional Medicare IM given:  01/21/2014  Discharge Disposition:  [place]  Per UR Regulation:  Reviewed for med. necessity/level of care/duration of stay  If discussed at [place] of Stay Meetings, dates discussed:    Comments:  01/21/2014 1500 NCM spoke to pt and offered choice for [facility]. Pt agreeable to [facility] for HH. States she had them in the past. Notified [facility] for [facility]. Pt states she has RW at home. Additional Medicare IM given, placed on chart.  Jonnie Finner RN CCM Case Mgmt phone [phone]  [id number] Rosana Hoes, RN, BSN, CCM: CHART REVIEWED AND UPDATED.  Next chart review due on [id number]. NO DISCHARGE NEEDS PRESENT AT THIS TIME WILL CONTINUE TO FOLLOW. CASE MANAGEMENT [phone]    [id number]/Rhonda Davis,RnBSN,CCM

## 2014-01-21 NOTE — Discharge Summary (Signed)
Physician Discharge Summary  Amanda [name] YQM:[id number] DOB: Apr 11, 1941 DOA: 01/08/2014  PCP: Tamsen Roers, MD  Admit date: 01/08/2014 Discharge date: 01/21/2014  Time spent: 35 minutes   Follow up with Odis Hollingshead, MD. Schedule an appointment as soon as possible for a visit in 3 weeks.   Specialty:  General Surgery   Contact information:   [address] [place] [address] [phone]       Follow up with Tamsen Roers, MD. Schedule an appointment as soon as possible for a visit in 1 week.   Specialty:  Family Medicine   Contact information:   [address] [place] [address] [phone]       Follow up with Silvano Rusk, MD. Schedule an appointment as soon as possible for a visit in 2 weeks.   Specialty:  Gastroenterology   Contact information:   [address] [place] [address] [phone]       Follow up with Simonne Maffucci, MD. Schedule an appointment as soon as possible for a visit in 2 weeks.   Specialty:  Pulmonary Disease   Contact information:   [place] [address] [phone]       Follow up with Cancer center. Schedule an appointment as soon as possible for a visit in 2 weeks. (In the cancer center)    Specialty:  Oncology   Contact information:   [place] [address] [phone]       Follow up with [place]. [facility] RN and Physical Therapy)    Contact information:   [address] [place] [address] [phone]      Discharge Diagnoses:  Principal Problem:   Anemia Active Problems:   GI bleed   Dysphagia   COPD (chronic obstructive pulmonary disease)   GERD (gastroesophageal reflux disease)   Hyperlipidemia   Type II or unspecified type diabetes mellitus without mention of complication, uncontrolled   Essential hypertension, benign   Stricture and stenosis of esophagus   Candida esophagitis   Colon cancer   Benign neoplasm of colon  Diverticulosis of colon (without mention of hemorrhage)   Diverticulosis of colon without hemorrhage   Malignant hypertension   Pre-operative respiratory examination   COPD with emphysema  Discharge Condition: stable  Diet recommendation: soft  Filed Weights   01/13/14 1110 01/18/14 0400 01/19/14 0400  Weight: 49.578 kg (109 lb 4.8 oz) 54.205 kg (119 lb 8 oz) 49.669 kg (109 lb 8 oz)   History of present illness:  Patient is a 73 year old female with history of COPD emphysema, hypertension, type 2 diabetes mellitus, GERD, hyperlipidemia presented to the ER with anemia. Patient was complaining of increasing fatigue, generalized weakness, increasing shortness of breath in the last 4 weeks. Patient was seen in the ED a week ago for dyspnea and had a negative CT angiogram of the chest for PE. Patient was treated for COPD exacerbation. Patient was found to have Low hemoglobin of 7.5, however the fecal occult test was negative. Patient was started on iron supplementation and was DC'd home. Patient saw her PCP today and was sent to the ER for low hemoglobin, hb 6.5, hematocrit 21.1 in the ED. Patient has noticed black stools, attributed to iron supplementation however a stool occult test is positive in the ED. Patient denies any excessive NSAID use. She has noticed difficulty swallowing in the last 1 month. She has lost about 10 lbs from not eating enough due  to dysphagia. Patient was seen by Dr. Carlean Purl on 5/28 and recommended EGD, per patient scheduled on on 01/10/14. Patient also reports that she never had any endoscopy or colonoscopy ever. Patient has finished her prednisone with taper today.   Hospital Course:  Acute on chronic anemia secondary to GI bleeding in the setting of newly diagnosed colon cancer - Pt initially admitted to SDU. She has received 2 units PRBC transfusion with post-transfusion Hgb 9.5. After transfusion Hgb has remained stable at 8.5 - 9.5. She has underwent a colonoscopy on  01/10/2014 with findings of cecal mass and multiple polyps. Surgery was consulted and she underwent laparoscopic-assisted right colectomy and transanal excision of rectal polyp on 01/12/2014. Biopsies were taken which showed invasive adenocarcinoma, clean margins after resection, negative lymph nodes (full report below). Staging CT of abdomen and pelvis done 01/10/14, no evidence of mets. Had CT chest done already on 01/01/14.  Colon cancer - Discussed in cancer center and she will see Dr. Alen Blew on July 2nd to establish care with oncology  Dysphagia secondary to esophageal stricture / candida esophagitis - Gastroenterology consulted, EGD done 01/09/14 with findings of tight peptic stricture in distal esophagus, s/p balloon dilation to 10 mm and with findings c/w candida esophagitis, she underwent a course of Diflucan. She had and a repeat EGD on 6/19 with persistent stricture d/p repeat dilatation. Following her second EGD she was tolerating a soft diet well without any dysphagia or odynophagia, no abdominal pain, nausea or vomiting. She will see Dr. Carlean Purl as an outpatient as he is considering repeat dilatation in 2 weeks.  New onset atrial fibrillation - Patient was scheduled for endoscopy on 6/16 however procedure not done because patient went into atrial fibrillation on 6/16, quickly converted back to sinus rhythm and started on Metoprolol by Cardiology. This was in the setting of acute illness while hospitalized, and anticoagulation was not necessary unless A fib recurs.  COPD (chronic obstructive pulmonary disease) - Stable, pulmonary followed patient while hospitalized. Prior to discharge patient was able to work with PT without oxygen and stable respiratory status on room air.  Type II or unspecified type diabetes mellitus without mention of complication, uncontrolled - her hemoglobin A1C is 6.8, will start Metformin on discharge. Advise PCP follow up as an outpatient for this.   Procedures: EGD 01/09/14:  Tight peptic stricture in the distal esophagus, s/p balloon dilation to 10 mm; Candida esophagitis.   Colonoscopy 01/10/14: 1. Bleeding malignant tumor/mass at the ileocecal valve; multiple biopsies were performed 2. Sessile polyp measuring 2-3 cm in size was found at the hepatic flexure - not removed 3. Sessile polyp measuring 6 mm in size was found at the hepatic flexure - not removed 4. Two sessile polyps measuring 3 and 4 mm in size were found in the transverse colon; polypectomy was performed 5. Sessile polyp measuring 3 mm in size was found in the descending colon; polypectomy was performed 6. Sessile polyp measuring 2 cm in size was found in the rectum; multiple biopsies were performed - not removed 7. There was severe diverticulosis noted in the sigmoid colon Laparoscopic-assisted right colectomy. 2. Transanal excision of rectal polyp 01/12/2014  EGD 6/19 1. Stricture was found in the lower third of the esophagus - looks benign, dilated to 12 mm  2. The remainder of the upper endoscopy exam was otherwise normal.  Lap assisted right hemicolectomy and transanal excision of benign polyp  Biopsy results - INVASIVE ADENOCARCINOMA, SPANNING 2.8 CM. - CARCINOMA INVADES INTO, BUT  NOT THROUGH MUSCULARIS PROPRIA. - RESECTION MARGINS ARE NEGATIVE FOR CARCINOMA. - TUBULAR ADENOMAS WITHOUT HIGH GRADE DYSPLASIA (X3). - BENIGN APPENDIX WITH MUCOSAL HYPERPLASIA. - SEVENTEEN OF SEVENTEEN LYMPH NODES NEGATIVE FOR CARCINOMA (0/17).  Consultations:  Surgery   Pulmonology  Cardiology  Gastroenterology   Discharge Exam: Filed Vitals:   01/20/14 2155 01/21/14 0547 01/21/14 0847 01/21/14 1406  BP: 130/51 144/50  120/47  Pulse: 71 64  66  Temp: 98.4 F (36.9 C) 98.6 F (37 C)  97.9 F (36.6 C)  TempSrc: Oral Oral  Oral  Resp: 18 18  20   Height:      Weight:      SpO2:  96% 93% 98%    General: NAD Cardiovascular: RRR Respiratory: CTA biL  Discharge Instructions    Medication List      STOP taking these medications       predniSONE 10 MG tablet  Commonly known as:  DELTASONE      TAKE these medications       amLODipine 5 MG tablet  Commonly known as:  NORVASC  Take 1 tablet (5 mg total) by mouth daily.     feeding supplement (RESOURCE BREEZE) Liqd  Take 1 Container by mouth 3 (three) times daily between meals.     ferrous sulfate 325 (65 FE) MG tablet  Take 1 tablet (325 mg total) by mouth daily.     metFORMIN 500 MG tablet  Commonly known as:  GLUCOPHAGE  Take 1 tablet (500 mg total) by mouth 2 (two) times daily with a meal.     metoprolol tartrate 25 MG tablet  Commonly known as:  LOPRESSOR  Take 1 tablet (25 mg total) by mouth 2 (two) times daily.     mometasone-formoterol 100-5 MCG/ACT Aero  Commonly known as:  DULERA  Inhale 1 puff into the lungs 2 (two) times daily.     omeprazole 40 MG capsule  Commonly known as:  PRILOSEC  Take 1 capsule (40 mg total) by mouth 2 (two) times daily before lunch and supper.     PROAIR HFA 108 (90 BASE) MCG/ACT inhaler  Generic drug:  albuterol  Inhale 2 puffs into the lungs 4 (four) times daily.     albuterol (2.5 MG/3ML) 0.083% nebulizer solution  Commonly known as:  PROVENTIL  Take 2.5 mg by nebulization 4 (four) times daily.       Follow-up Information   Follow up with ROSENBOWER,TODD J, MD. Schedule an appointment as soon as possible for a visit in 3 weeks.   Specialty:  General Surgery   Contact information:   [address] [place] [address] [phone]       Follow up with Tamsen Roers, MD. Schedule an appointment as soon as possible for a visit in 1 week.   Specialty:  Family Medicine   Contact information:   [address] [place] [address] [phone]       Follow up with Silvano Rusk, MD. Schedule an appointment as soon as possible for a visit in 2 weeks.   Specialty:  Gastroenterology   Contact information:   [address] [place] [address] [phone]        Follow up with Simonne Maffucci, MD. Schedule an appointment as soon as possible for a visit in 2 weeks.   Specialty:  Pulmonary Disease   Contact information:   [place] [address] [phone]       Follow up with [facility],  GARY, MD. Schedule an appointment as soon as possible for a visit in 2 weeks. (In the cancer center)    Specialty:  Oncology   Contact information:   [place] [address] [phone]       Follow up with [place]. [facility] RN and Physical Therapy)    Contact information:   [address] [place] [address] [phone]       The results of significant diagnostics from this hospitalization (including imaging, microbiology, ancillary and laboratory) are listed below for reference.    Significant Diagnostic Studies: Dg Chest 2 View  01/01/2014   CLINICAL DATA:  Shortness of Breath  EXAM: CHEST  2 VIEW  COMPARISON:  Dec 28, 2013  FINDINGS: There is underlying emphysematous change. There is no edema or consolidation. Heart size is normal. Pulmonary vascularity reflects underlying emphysema. No adenopathy. Patient is status post right total shoulder replacement.  IMPRESSION: Underlying emphysematous change.  No edema or consolidation.   Electronically Signed   By: Lowella Grip M.D.   On: 01/01/2014 11:13   Dg Chest 2 View  12/28/2013   CLINICAL DATA:  Shortness of breath, cough, recent pneumonia, former smoking history  EXAM: CHEST  2 VIEW  COMPARISON:  Chest x-ray of 12/19/2013  FINDINGS: The lungs are clear but hyperaerated with increased AP diameter and flattened hemidiaphragms consistent with emphysema. Mediastinal contours appear normal. The heart is within normal limits in size. The bones are osteopenic. Right humeral head prosthesis is again noted.  IMPRESSION: Emphysema.  No active lung disease.   Electronically Signed   By: Ivar Drape M.D.   On: 12/28/2013 13:22   Ct Angio Chest  Pe W/cm &/or Wo Cm  01/01/2014   CLINICAL DATA:  Shortness of breath.  Question pulmonary embolus.  EXAM: CT ANGIOGRAPHY CHEST WITH CONTRAST  TECHNIQUE: Multidetector CT imaging of the chest was performed using the standard protocol during bolus administration of intravenous contrast. Multiplanar CT image reconstructions and MIPs were obtained to evaluate the vascular anatomy.  CONTRAST:  39mL OMNIPAQUE IOHEXOL 350 MG/ML SOLN  COMPARISON:  None.  FINDINGS: No pulmonary embolus. No pathologically enlarged mediastinal, hilar or axillary lymph nodes. Atherosclerotic calcification of the arterial vasculature, including three-vessel involvement of the coronary arteries. Heart size normal. No pericardial effusion.  Mild biapical pleural parenchymal scarring. Moderate to severe centrilobular emphysema. Lungs are otherwise clear. No pleural fluid. Airway is unremarkable.  Incidental imaging of the upper abdomen shows the visualized portions of the liver, adrenal glands, kidneys, spleen, stomach and bowel to be grossly unremarkable. No upper abdominal adenopathy. No worrisome lytic or sclerotic lesions.  Review of the MIP images confirms the above findings.  IMPRESSION: 1. Negative for pulmonary embolus. 2. No findings to explain the patient's shortness of breath, other than moderate to severe emphysema. 3. Three-vessel coronary artery calcification.   Electronically Signed   By: Lorin Picket M.D.   On: 01/01/2014 12:42   Ct Abdomen Pelvis W Contrast  01/10/2014   CLINICAL DATA:  New discovered cecal mass, carcinoma suspected.  EXAM: CT ABDOMEN AND PELVIS WITH CONTRAST  TECHNIQUE: Multidetector CT imaging of the abdomen and pelvis was performed using the standard protocol following bolus administration of intravenous contrast.  CONTRAST:  12mL OMNIPAQUE IOHEXOL 300 MG/ML  SOLN  COMPARISON:  None.  FINDINGS: There is a soft tissue mass along the anterior wall of the cecum measuring 2.2 cm x 1.8 cm, which may reflect a  polyp.  No other convincing colonic masses are seen. There is no wall thickening. The pericolonic fat is unremarkable. There is no mesenteric adenopathy.  Liver is normal in size and attenuation.  No liver mass is seen.  There are no lung base nodules or masses. There are changes of emphysema and mild interstitial thickening.  Spleen, gallbladder, pancreas, adrenal glands:  Normal.  There is bilateral renal cortical thinning. There is a 2.2 cm right renal cyst. No other renal masses. No hydronephrosis. Normal ureters. Normal bladder.  Uterus and adnexa are unremarkable.  Atherosclerotic calcifications are noted along the abdominal aorta and iliac arteries. No aneurysm.  Remainder of the colon and small bowel are unremarkable. A normal appendix is visualized.  No osteoblastic or osteolytic lesions.  IMPRESSION: 1. 2.2 cm mass in the cecum is consistent with a large polyp. There is no evidence of extension beyond the cecum. Specifically, no evidence of locally invasive colon carcinoma. 2. No evidence of metastatic disease within the abdomen or pelvis. 3. No acute findings.   Electronically Signed   By: Lajean Manes M.D.   On: 01/10/2014 17:05   Dg Chest Port 1 View  01/14/2014   CLINICAL DATA:  Shortness of breath.  EXAM: PORTABLE CHEST - 1 VIEW  COMPARISON:  01/08/2014.  FINDINGS: Mediastinum and hilar structures are normal. Mild cardiomegaly with mild interstitial prominence a right-sided pleural effusion. These findings suggest mild congestive heart failure. COPD. Right shoulder replacements. No acute bony abnormality .  IMPRESSION: 1. Mild congestive heart failure. 2. COPD.   Electronically Signed   By: Marcello Moores  Register   On: 01/14/2014 09:28   Dg Chest Portable 1 View  01/08/2014   CLINICAL DATA:  Weakness.  Cough.  EXAM: PORTABLE CHEST - 1 VIEW  COMPARISON:  01/01/2014  FINDINGS: Pulmonary hyperinflation again seen, consistent with COPD. No evidence of pulmonary infiltrate or edema. No evidence of pleural  effusion. Heart size is within normal limits. No mass or lymphadenopathy identified. Right shoulder prosthesis again noted.  IMPRESSION: COPD.  No active disease.   Electronically Signed   By: Earle Gell M.D.   On: 01/08/2014 14:59   Microbiology: Recent Results (from the past 240 hour(s))  SURGICAL PCR SCREEN     Status: None   Collection Time    01/12/14  7:23 AM      Result Value Ref Range Status   MRSA, PCR NEGATIVE  NEGATIVE Final   Staphylococcus aureus NEGATIVE  NEGATIVE Final   Comment:            The Xpert SA Assay (FDA     approved for NASAL specimens     in patients over 47 years of age),     is one component of     a comprehensive surveillance     program.  Test performance has     been validated by [employer] for patients greater     than or equal to 40 year old.     It is not intended     to diagnose infection nor to     guide or monitor treatment.   Labs: Basic Metabolic Panel:  Recent Labs Lab 01/16/14 1010 01/16/14 1455 01/17/14 0814 01/18/14 0315 01/19/14 0317  NA 137 140 138 138 136*  K 3.2* 3.0* 3.7 3.6* 3.8  CL 100 103 102 103 97  CO2 27 29 27 26 29   GLUCOSE 195* 148* 162* 142* 115*  BUN 5* 4* 3* 4* 4*  CREATININE 0.70 0.67 0.75 0.82 0.86  CALCIUM 8.6 8.7 8.6 8.7 8.8   CBC:  Recent Labs Lab 01/16/14 1010 01/18/14 0315 01/19/14 0317  WBC 7.2 5.7 6.5  HGB 9.5* 8.6* 9.3*  HCT 29.9* 27.3* 29.5*  MCV 81.0 80.5 79.7  PLT 240 285 296   BNP: BNP (last 3 results)  Recent Labs  01/01/14 1100  PROBNP 1711.0*   CBG:  Recent Labs Lab 01/20/14 0804 01/20/14 1139 01/20/14 1636 01/20/14 2201 01/21/14 0725  GLUCAP 129* 218* 213* 221* 137*    Signed:  Marzetta [name]  [facility] 01/21/2014, 6:34 PM

## 2014-01-21 NOTE — [place] Notes (Signed)
Pt. discharged home at this time, escorted out  via wheelchair to private car by CNA. Alert and oriented x 4. Satisfactory teach back done by pt. After discharge teaching. All d/c paper work and prescriptions given to pt. F/W appt. In place, v/s stable, see flow sheets, pt. denies pain, no s/s of distress noted at the time of discharge.

## 2014-01-22 ENCOUNTER — Telehealth: Payer: Self-pay | Admitting: Oncology

## 2014-01-22 ENCOUNTER — Encounter (HOSPITAL_COMMUNITY): Payer: Self-pay | Admitting: Internal Medicine

## 2014-01-22 LAB — GLUCOSE, CAPILLARY: GLUCOSE-CAPILLARY: 372 mg/dL — AB (ref 70–99)

## 2014-01-22 NOTE — Telephone Encounter (Signed)
S/W PATIENT AND GAVE NP APPT FOR 07/02 @ 10:30 W/DR. SHADAD.  REFERRING DR. Gratiot PACKET MAILED.

## 2014-01-22 NOTE — Telephone Encounter (Signed)
LEFT MESSAGE FOR PATIENT TO RETURN CALL TO SCHEDULE NEW PATIENT APPT.  °

## 2014-01-22 NOTE — Telephone Encounter (Signed)
C/D 01/22/14 for appt. 02/01/14

## 2014-01-24 ENCOUNTER — Telehealth: Payer: Self-pay | Admitting: Internal Medicine

## 2014-01-24 NOTE — Telephone Encounter (Signed)
She will need an EGD/dili at hospital at some point - not sure when depends upon how she is swallowing now  Please get a dysphagia update and I will decide when she needs to be scoped again

## 2014-01-24 NOTE — Telephone Encounter (Signed)
Dr. Carlean Purl does the patient need a follow up with you?  He was instructed on the discharge summary to follow up in 2 weeks

## 2014-01-25 NOTE — Telephone Encounter (Signed)
I spoke with the patient's husband. She is doing well on a soft diet.  Husband states that he is "in no rush".

## 2014-01-28 NOTE — Telephone Encounter (Signed)
Schedule an EGD ballon dilation with MAC during my hospital time in July please

## 2014-01-29 ENCOUNTER — Ambulatory Visit (INDEPENDENT_AMBULATORY_CARE_PROVIDER_SITE_OTHER): Payer: Medicare HMO | Admitting: Pulmonary Disease

## 2014-01-29 ENCOUNTER — Encounter: Payer: Self-pay | Admitting: Pulmonary Disease

## 2014-01-29 VITALS — BP 118/64 | HR 59 | Ht 61.0 in | Wt 101.0 lb

## 2014-01-29 DIAGNOSIS — J411 Mucopurulent chronic bronchitis: Secondary | ICD-10-CM

## 2014-01-29 DIAGNOSIS — J432 Centrilobular emphysema: Secondary | ICD-10-CM

## 2014-01-29 DIAGNOSIS — J438 Other emphysema: Secondary | ICD-10-CM

## 2014-01-29 MED ORDER — MOMETASONE FURO-FORMOTEROL FUM 100-5 MCG/ACT IN AERO: 1.0000 | INHALATION_SPRAY | Freq: Two times a day (BID) | RESPIRATORY_TRACT | Status: DC

## 2014-01-29 NOTE — Assessment & Plan Note (Addendum)
Amanda [name] seems to be stable on Dulera. She needs to have pulmonary function testing so we will order that and our office for the next few weeks. I think that she would do really well with pulmonary rehabilitation to help her recover from her recent surgeries. However, she has been told that her activity is to be limited for a while. She would prefer to put this off and just stick with physical therapy at home for now.  Plan: - Advised to use albuterol only on an as-needed basis -Continue Dulera -Full pulmonary function test  -flu shot in the fall -Pneumonia shots are up-to-date -Consider pulmonary rehabilitation after next visit in 3 months

## 2014-01-29 NOTE — Patient Instructions (Signed)
We will arrange a PFT Take the [facility] 2 puffs twice a day no matter how you feel Only use the albuterol as needed We will see you back in 4 months or sooner if needed

## 2014-01-29 NOTE — Progress Notes (Signed)
Subjective:    Patient ID: Amanda [name], female    DOB: [birth year], 73 y.o.   MRN: [id number]  Synopsis> likely COPD, first seen by [facility] 2015. Also diagnosed with stage I colon cancer requiring a partial colectomy in 2015.  HPI  01/29/2014.> From a respiratory standpoint Amanda [name] has been doing well since last visit. She continues to take the [facility] twice a day and says that she has not had problems with shortness of breath or cough or chest tightness or wheezing since I prescribed a prolonged prednisone taper. However, she unfortunately was diagnosed with colon cancer and required a hemicolectomy. She came through this without respiratory complications. She has had the expected level of fatigue ever since then. She has been participating in physical therapy at home. She has been using albuterol up to 4 times a day because she believes that she is supposed to.  Past Medical History  Diagnosis Date  . Emphysema lung   . Hyperlipidemia   . GERD (gastroesophageal reflux disease)   . Vitamin D deficiency   . Closed fracture of unspecified part of upper end of humerus   . Complete rupture of rotator cuff   . Other vitamin B12 deficiency anemia   . Neurogenic bladder, NOS   . COPD (chronic obstructive pulmonary disease)   . Type II or unspecified type diabetes mellitus without mention of complication, uncontrolled     diet controlled  . Essential hypertension, benign     off lisinporil for last 2 months  . Esophageal stricture 12/28/2013     Review of Systems  Constitutional: Positive for fatigue. Negative for fever and chills.  HENT: Negative for postnasal drip, rhinorrhea and sinus pressure.   Respiratory: Negative for cough, shortness of breath and wheezing.   Cardiovascular: Negative for chest pain, palpitations and leg swelling.       Objective:   Physical Exam Filed Vitals:   01/29/14 1358  BP: 118/64  Pulse: 59  Height: 5\' 1"  (1.549 m)  Weight: 101 lb (45.813  kg)  SpO2: 98%   Gen: well appearing, no acute distress HEENT: NCAT, OP Clear,  PULM: CTA B CV: RRR, no mgr, no JVD AB: BS+, soft, nontender, no hsm Ext: warm, no edema, no clubbing, no cyanosis      Assessment & Plan:   COPD with emphysema Amanda [name] seems to be stable on Dulera. She needs to have pulmonary function testing so we will order that and our office for the next few weeks. I think that she would do really well with pulmonary rehabilitation to help her recover from her recent surgeries. However, she has been told that her activity is to be limited for a while. She would prefer to put this off and just stick with physical therapy at home for now.  Plan: - Advised to use albuterol only on an as-needed basis -Continue Dulera -Full pulmonary function test  -flu shot in the fall -Pneumonia shots are up-to-date -Consider pulmonary rehabilitation after next visit in 3 months    Updated Medication List Outpatient Encounter Prescriptions as of 01/29/2014  Medication Sig  . albuterol (PROAIR HFA) 108 (90 BASE) MCG/ACT inhaler Inhale 2 puffs into the lungs 4 (four) times daily.  Marland Kitchen albuterol (PROVENTIL) (2.5 MG/3ML) 0.083% nebulizer solution Take 2.5 mg by nebulization 4 (four) times daily.  Marland Kitchen amLODipine (NORVASC) 5 MG tablet Take 1 tablet (5 mg total) by mouth daily.  . ferrous sulfate 325 (65 FE) MG tablet Take 1 tablet (325 mg  total) by mouth daily.  . metoprolol tartrate (LOPRESSOR) 25 MG tablet Take 1 tablet (25 mg total) by mouth 2 (two) times daily.  . mometasone-formoterol (DULERA) 100-5 MCG/ACT AERO Inhale 1 puff into the lungs 2 (two) times daily.  Marland Kitchen omeprazole (PRILOSEC) 40 MG capsule Take 1 capsule (40 mg total) by mouth 2 (two) times daily before lunch and supper.  . [DISCONTINUED] mometasone-formoterol (DULERA) 100-5 MCG/ACT AERO Inhale 1 puff into the lungs 2 (two) times daily.  . [DISCONTINUED] feeding supplement, RESOURCE BREEZE, (RESOURCE BREEZE) LIQD Take 1 Container  by mouth 3 (three) times daily between meals.  . [DISCONTINUED] metFORMIN (GLUCOPHAGE) 500 MG tablet Take 1 tablet (500 mg total) by mouth 2 (two) times daily with a meal.

## 2014-01-29 NOTE — Telephone Encounter (Signed)
Spoke with patient about scheduling an EGD in July, she said that would be fine.  I will call ENDO at [facility] tomorrow and set up before calling Marithza back with date/time/instucions.

## 2014-01-30 ENCOUNTER — Other Ambulatory Visit: Payer: Self-pay

## 2014-01-30 DIAGNOSIS — R1310 Dysphagia, unspecified: Secondary | ICD-10-CM

## 2014-01-30 NOTE — Telephone Encounter (Signed)
Spoke with husband Clair Gulling, discussed date/time of procedure (03/02/14 at 9:00am, arrive at 7:30am at [place]).  Evalisse to call me back to discuss meds before I mail out instructions.

## 2014-01-30 NOTE — Telephone Encounter (Signed)
Amanda [name] called back and we discussed prep for EGD, she verbalized understanding.  Instructions will be mailed out tomorrow.

## 2014-01-30 NOTE — Telephone Encounter (Signed)
Spoke to Terri at [place] and set up date/time for EGD/dil.  03/02/14 at 9:00am, arrive at 7:30am.

## 2014-01-31 ENCOUNTER — Encounter (INDEPENDENT_AMBULATORY_CARE_PROVIDER_SITE_OTHER): Payer: Self-pay | Admitting: General Surgery

## 2014-01-31 ENCOUNTER — Ambulatory Visit (INDEPENDENT_AMBULATORY_CARE_PROVIDER_SITE_OTHER): Payer: Medicare HMO | Admitting: General Surgery

## 2014-01-31 VITALS — BP 126/70 | HR 58 | Temp 97.2°F | Ht 61.0 in | Wt 98.0 lb

## 2014-01-31 DIAGNOSIS — Z4889 Encounter for other specified surgical aftercare: Secondary | ICD-10-CM

## 2014-01-31 NOTE — Patient Instructions (Signed)
Continue light activities as we discussed. Diet as tolerated.

## 2014-01-31 NOTE — Progress Notes (Signed)
Procedure:  Laparoscopic-assisted right colectomy, transanal excision of rectal polyp.  Date:  01/12/2014  Pathology:  Stage I right colon cancer  History:  She is here for her first postoperative visit. She had some postoperative atrial fibrillation and was seen by cardiology in the hospital. She is regaining her strength. She is eating better. Bowels are moving. Pathology on the rectal polyp was benign. She is due to see an oncologist later this week.  Exam: General- Is in NAD. Abdomen-Soft, nontender, incisions are clean, intact, and solid.  Assessment:  Stage I right colon cancer status post partial colectomy. Also had a rectal polyp removed which was benign-she is progressing well.  Plan:  Continue light activities. Return visit 4 weeks.

## 2014-02-01 ENCOUNTER — Telehealth: Payer: Self-pay | Admitting: Oncology

## 2014-02-01 ENCOUNTER — Other Ambulatory Visit: Payer: Medicare HMO

## 2014-02-01 ENCOUNTER — Ambulatory Visit (HOSPITAL_BASED_OUTPATIENT_CLINIC_OR_DEPARTMENT_OTHER): Payer: Medicare HMO | Admitting: Oncology

## 2014-02-01 ENCOUNTER — Ambulatory Visit: Payer: Medicare HMO

## 2014-02-01 ENCOUNTER — Ambulatory Visit (HOSPITAL_COMMUNITY)
Admission: RE | Admit: 2014-02-01 | Discharge: 2014-02-01 | Disposition: A | Discharge status: Home / Self Care | Payer: Medicare HMO | Source: Ambulatory Visit | Attending: Pulmonary Disease | Admitting: Pulmonary Disease

## 2014-02-01 ENCOUNTER — Encounter: Payer: Self-pay | Admitting: Oncology

## 2014-02-01 VITALS — BP 127/46 | HR 51 | Temp 97.8°F | Resp 18 | Ht 61.0 in | Wt 99.8 lb

## 2014-02-01 DIAGNOSIS — R0609 Other forms of dyspnea: Secondary | ICD-10-CM | POA: Insufficient documentation

## 2014-02-01 DIAGNOSIS — R0989 Other specified symptoms and signs involving the circulatory and respiratory systems: Principal | ICD-10-CM | POA: Insufficient documentation

## 2014-02-01 DIAGNOSIS — J411 Mucopurulent chronic bronchitis: Secondary | ICD-10-CM

## 2014-02-01 DIAGNOSIS — D509 Iron deficiency anemia, unspecified: Secondary | ICD-10-CM

## 2014-02-01 DIAGNOSIS — C189 Malignant neoplasm of colon, unspecified: Secondary | ICD-10-CM

## 2014-02-01 DIAGNOSIS — C180 Malignant neoplasm of cecum: Secondary | ICD-10-CM

## 2014-02-01 DIAGNOSIS — Z87891 Personal history of nicotine dependence: Secondary | ICD-10-CM | POA: Insufficient documentation

## 2014-02-01 DIAGNOSIS — J988 Other specified respiratory disorders: Secondary | ICD-10-CM | POA: Insufficient documentation

## 2014-02-01 DIAGNOSIS — J42 Unspecified chronic bronchitis: Secondary | ICD-10-CM | POA: Insufficient documentation

## 2014-02-01 MED ORDER — ALBUTEROL SULFATE (2.5 MG/3ML) 0.083% IN NEBU
2.5000 mg | INHALATION_SOLUTION | Freq: Once | RESPIRATORY_TRACT | Status: AC
  Administered 2014-02-01: 2.5 mg via RESPIRATORY_TRACT

## 2014-02-01 NOTE — Progress Notes (Signed)
Checked in new pt with no financial concerns. °

## 2014-02-01 NOTE — Progress Notes (Signed)
Please see consult note.  

## 2014-02-01 NOTE — Telephone Encounter (Signed)
, °

## 2014-02-01 NOTE — Consult Note (Signed)
Reason for Referral: Colon cancer.   HPI: [name] is a 73 year old female currently of [place] where she lived the majority of her life. She has a history significant for COPD emphysema, hypertension, type 2 diabetes mellitus, GERD, hyperlipidemia presented to the ER in June of 2015 with anemia. Patient was complaining of increasing fatigue, generalized weakness, increasing shortness of breath in the last 4 weeks. Patient was found to have Low hemoglobin of 7.5, however the fecal occult test was negative. Patient was started on iron supplementation sent to the ER for low hemoglobin again. Patient has noticed black stools, attributed to iron supplementation however a stool occult test is positive in the ED. She has noticed difficulty swallowing in the last 1 month. She has lost about 10 lbs from not eating enough due to dysphagia. Patient was seen by Dr. Carlean Purl on 5/28 and recommended EGD, per patient scheduled on on 01/10/14. She was initially admitted to [facility]U. She has received 2 units PRBC transfusion with post-transfusion Hgb 9.5. After transfusion Hgb has remained stable at 8.5 - 9.5. She underwent a colonoscopy on 01/10/2014 with findings of cecal mass and multiple polyps. Surgery was consulted and she underwent laparoscopic-assisted right colectomy and transanal excision of rectal polyp on 01/12/2014. The biopsy from that procedure showed invasive adenocarcinoma spanning 2.8 cm and invades into but not through the muscularis propria. 0/17 lymph nodes showed any malignancy. 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 Her final pathological staging was T2 N0. Her CT scan of the abdomen and pelvis showed no evidence of any metastatic disease. Tolerated the procedure well and was discharged on 01/21/2014.  Clinically, since her discharge she has been doing very well. She still relatively sore around the incision site but no other complications. She is slowly recovering and is clear to  eat regular diet. She is not reporting any abdominal pain or distention. She has not reported any hematochezia or melena. She does not report any headaches or blurry vision or double vision. She has not reported any syncope or seizures. She does not report any fevers or chills or sweats. She does report about 10 pound weight loss. She is not reporting any chest pain or difficulty breathing. She does not report any cough or hemoptysis. Does not report any palpitation or leg edema. Does not report any orthopnea or PND. She is no longer reporting any nausea or vomiting or abdominal pain. She does not report any constipation or diarrhea. She does not report any frequency urgency or urinary complications. Does not report any skeletal complaints. She has not reported any rashes or lesions. Rest of the review of systems unremarkable.    Past Medical History  Diagnosis Date  . Emphysema lung   . Hyperlipidemia   . GERD (gastroesophageal reflux disease)   . Vitamin D deficiency   . Closed fracture of unspecified part of upper end of humerus   . Complete rupture of rotator cuff   . Other vitamin B12 deficiency anemia   . Neurogenic bladder, NOS   . COPD (chronic obstructive pulmonary disease)   . Type II or unspecified type diabetes mellitus without mention of complication, uncontrolled     diet controlled  . Essential hypertension, benign     off lisinporil for last 2 months  . Esophageal stricture 12/28/2013  :  Past Surgical History  Procedure Laterality Date  . Orif shoulder fracture Right 2011    arthroplasty  . Tonsillectomy and adenoidectomy  age 60 or 6  .  Eye surgery Bilateral 2010    both eyes lens replacments  . Esophagogastroduodenoscopy (egd) with propofol N/A 01/09/2014    Procedure: ESOPHAGOGASTRODUODENOSCOPY (EGD) WITH PROPOFOL;  Surgeon: [name], MD;  Location: [facility] END[facility]COPY;  Service: Endoscopy;  Laterality: N/A;  . Colonoscopy N/A 01/10/2014    Procedure: COLONOSCOPY;   Surgeon: [name], MD;  Location: [facility] END[facility]COPY;  Service: Endoscopy;  Laterality: N/A;  . Laparoscopic partial colectomy N/A 01/12/2014    Procedure: LAPAROSCOPIC ASSISTED PARTIAL COLECTOMY AND REMOVAL OF RECTAL POLYP;  Surgeon: Odis Hollingshead, MD;  Location: [facility] ORS;  Service: General;  Laterality: N/A;  . Esophagogastroduodenoscopy N/A 01/16/2014    Procedure: ESOPHAGOGASTRODUODENOSCOPY (EGD);  Surgeon: Gatha Mayer, MD;  Location: [facility];  Service: Endoscopy;  Laterality: N/A;  . Esophagogastroduodenoscopy (egd) with propofol N/A 01/19/2014    Procedure: ESOPHAGOGASTRODUODENOSCOPY (EGD) WITH PROPOFOL;  Surgeon: Gatha Mayer, MD;  Location: [facility];  Service: Endoscopy;  Laterality: N/A;  :   Current Outpatient Prescriptions  Medication Sig Dispense Refill  . amLODipine (NORVASC) 5 MG tablet Take 1 tablet (5 mg total) by mouth daily.  30 tablet  1  . ferrous sulfate 325 (65 FE) MG tablet Take 1 tablet (325 mg total) by mouth daily.  30 tablet  0  . metoprolol tartrate (LOPRESSOR) 25 MG tablet Take 1 tablet (25 mg total) by mouth 2 (two) times daily.  60 tablet  1  . mometasone-formoterol (DULERA) 100-5 MCG/ACT AERO Inhale 1 puff into the lungs 2 (two) times daily.  1 Inhaler  5  . omeprazole (PRILOSEC) 40 MG capsule Take 40 mg by mouth daily.       No current facility-administered medications for this visit.      No Known Allergies:  Family History  Problem Relation Age of Onset  . Diabetes Father   . Bone cancer Father     bone marrow  . Emphysema Mother   . Pneumonia Mother   :  History   Social History  . Marital Status: Divorced    Spouse Name: N/A    Number of Children: 2  . Years of Education: N/A   Occupational History  . retired    Social History Main Topics  . Smoking status: Former Smoker -- 1.00 packs/day for 20 years    Types: Cigarettes    Quit date: 08/03/2005  . Smokeless tobacco: Never Used  . Alcohol Use: No  . Drug Use: No   . Sexual Activity: No   Other Topics Concern  . 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 2 caffeinated beverages daily.   Lives in [place].  :  Pertinent items are noted in HPI.  Exam: ECOG 1 Blood pressure 127/46, pulse 51, temperature 97.8 F (36.6 C), temperature source Oral, resp. rate 18, height 5\' 1"  (1.549 m), weight 99 lb 12.8 oz (45.269 kg). General appearance: alert, cooperative. Head: Normocephalic, without obvious abnormality Throat: lips, mucosa, and tongue normal; teeth and gums normal Neck: no adenopathy Back: symmetric, no curvature. ROM normal. No CVA tenderness. Resp: clear to auscultation bilaterally Cardio: regular rate and rhythm, S1, S2 normal, no murmur, click, rub or gallop GI: soft, non-tender; bowel sounds normal; no masses,  no organomegaly Extremities: extremities normal, atraumatic, no cyanosis or edema Pulses: 2+ and symmetric Skin: Skin color, texture, turgor normal. No rashes or lesions Lymph nodes: Cervical, supraclavicular, and axillary nodes normal. Neurologic: Grossly normal   Ct  Abdomen Pelvis W Contrast  01/10/2014   CLINICAL DATA:  New discovered cecal mass, carcinoma suspected.  EXAM: CT ABDOMEN AND PELVIS WITH CONTRAST  TECHNIQUE: Multidetector CT imaging of the abdomen and pelvis was performed using the standard protocol following bolus administration of intravenous contrast.  CONTRAST:  44mL OMNIPAQUE IOHEXOL 300 MG/ML  SOLN  COMPARISON:  None.  FINDINGS: There is a soft tissue mass along the anterior wall of the cecum measuring 2.2 cm x 1.8 cm, which may reflect a polyp. No other convincing colonic masses are seen. There is no wall thickening. The pericolonic fat is unremarkable. There is no mesenteric adenopathy.  Liver is normal in size and attenuation.  No liver mass is seen.  There are no lung base nodules or masses. There are changes of emphysema and mild interstitial thickening.   Spleen, gallbladder, pancreas, adrenal glands:  Normal.  There is bilateral renal cortical thinning. There is a 2.2 cm right renal cyst. No other renal masses. No hydronephrosis. Normal ureters. Normal bladder.  Uterus and adnexa are unremarkable.  Atherosclerotic calcifications are noted along the abdominal aorta and iliac arteries. No aneurysm.  Remainder of the colon and small bowel are unremarkable. A normal appendix is visualized.  No osteoblastic or osteolytic lesions.  IMPRESSION: 1. 2.2 cm mass in the cecum is consistent with a large polyp. There is no evidence of extension beyond the cecum. Specifically, no evidence of locally invasive colon carcinoma. 2. No evidence of metastatic disease within the abdomen or pelvis. 3. No acute findings.   Electronically Signed   By: Lajean Manes M.D.   On: 01/10/2014 17:05    Assessment and Plan:    73 year old woman with the following issues:  1. Colon cancer diagnosed in June of 2015 after presenting with a cecal mass measuring 2.2 cm. She is status post right hemicolectomy with a tumor showing T2 N0 with 0/17 out of lymph nodes involved with cancer. Her staging workup did not reveal any metastatic disease indicating a stage I colon cancer. The natural course of this disease was discussed with the patient and her husband. At this point there is no role for adjuvant therapy and she will not require any further therapy at this time. She will require active surveillance with clinical visit every 6 months and imaging studies yearly per the NCCN guidelines. The risk of relapse is relatively low and adjuvant chemotherapy is not indicated for stage I colon cancer.  2. Iron deficiency anemia: Related to her colon cancer and seems to be improving. I will check her iron stores in about 6 months and we can certainly recommend IV iron supplements if needed to.  3. Surveillance colonoscopy: She will need a repeat colonoscopy in June of 2016.  4. Followup: Will be in  6 months for a clinical visit and repeat laboratory testing.

## 2014-02-04 LAB — PULMONARY FUNCTION TEST
DL/VA % pred: 34 %
DL/VA: 1.51 ml/min/mmHg/L
DLCO UNC % PRED: 20 %
DLCO UNC: 4.16 ml/min/mmHg
FEF 25-75 Post: 0.28 L/sec
FEF 25-75 Pre: 0.25 L/sec
FEF2575-%Change-Post: 11 %
FEF2575-%Pred-Post: 19 %
FEF2575-%Pred-Pre: 17 %
FEV1-%CHANGE-POST: 9 %
FEV1-%PRED-POST: 37 %
FEV1-%Pred-Pre: 34 %
FEV1-Post: 0.69 L
FEV1-Pre: 0.63 L
FEV1FVC-%Change-Post: 7 %
FEV1FVC-%Pred-Pre: 52 %
FEV6-%Change-Post: 4 %
FEV6-%PRED-PRE: 64 %
FEV6-%Pred-Post: 66 %
FEV6-POST: 1.55 L
FEV6-Pre: 1.49 L
FEV6FVC-%CHANGE-POST: 1 %
FEV6FVC-%PRED-POST: 99 %
FEV6FVC-%PRED-PRE: 98 %
FVC-%Change-Post: 1 %
FVC-%PRED-PRE: 65 %
FVC-%Pred-Post: 66 %
FVC-Post: 1.64 L
FVC-Pre: 1.61 L
PRE FEV6/FVC RATIO: 93 %
Post FEV1/FVC ratio: 42 %
Post FEV6/FVC ratio: 95 %
Pre FEV1/FVC ratio: 39 %
RV % PRED: 163 %
RV: 3.51 L
TLC % pred: 110 %
TLC: 5.1 L

## 2014-02-07 ENCOUNTER — Telehealth: Payer: Self-pay | Admitting: Pulmonary Disease

## 2014-02-07 NOTE — Telephone Encounter (Signed)
Forms completed and placed in BQ look at stack as Caryl Pina is off this week and BQ back in [place] office on Monday. BQ will need to sign prior to faxing PA back.

## 2014-02-08 ENCOUNTER — Encounter (HOSPITAL_COMMUNITY): Payer: Self-pay | Admitting: Pharmacy Technician

## 2014-02-08 NOTE — Progress Notes (Signed)
Quick Note:  Called, spoke with pt. Informed her of PFT results per BQ. She verbalized understanding. ______

## 2014-02-12 NOTE — Telephone Encounter (Signed)
Noted. LM with pt to return call to make aware

## 2014-02-12 NOTE — Telephone Encounter (Signed)
[employer] prior [employer].  Would like to know the status of pt's dulera prior auth.  Holland Falling can be reached at [phone].  Holland Falling states this is expedited & needs to be done today.  Satira Anis

## 2014-02-12 NOTE — Telephone Encounter (Signed)
Pt returned call.  Holly D Pryor ° °

## 2014-02-12 NOTE — Telephone Encounter (Signed)
This has already been filled out, signed, and faxed today.

## 2014-02-12 NOTE — Telephone Encounter (Signed)
Spoke with patient  Aware that PA form has been sent back to plan Will make her aware once we hear determination. Will send to [facility] to follow up on.

## 2014-02-12 NOTE — Telephone Encounter (Signed)
Caryl Pina please advise thanks

## 2014-02-13 NOTE — Telephone Encounter (Signed)
Spoke with Sharee Pimple from [employer] - He wants to make sure we received form regarding which inhalers would be covered in place of [place].  - Form received and faxed to [facility] in [place] to have Dr Lake Bells to fill out - Will forward message to [place]

## 2014-02-13 NOTE — Telephone Encounter (Signed)
[employer] pharmacy is calling back on this pt

## 2014-02-14 NOTE — Telephone Encounter (Signed)
ADvair 250/50 or symbicort 80/4.5

## 2014-02-14 NOTE — Telephone Encounter (Signed)
Amanda [name] was denied for this pt because it is not on the plan's formulary.  Before I fill out a formulary exception, is there another medication you would me to send in for her?  Thanks, Caryl Pina

## 2014-02-15 NOTE — Telephone Encounter (Signed)
ATC pt X2, NA, NVM set up.  WCB.  I will not send in the Advair until I discuss this with the pt.

## 2014-02-16 NOTE — Telephone Encounter (Signed)
ATC PA NA WCB

## 2014-02-22 ENCOUNTER — Encounter (HOSPITAL_COMMUNITY): Payer: Self-pay | Admitting: *Deleted

## 2014-02-22 NOTE — Telephone Encounter (Signed)
Pt returned Ashley's call. °

## 2014-02-22 NOTE — Telephone Encounter (Signed)
lmtcb X1 to discuss med changes with pt.

## 2014-02-22 NOTE — Telephone Encounter (Signed)
Spoke with patient Per pt, Dr Rex Kras has called in Advair 250/50 for the pt- use 1 puff BID Pt advised to complete Dulera samples Nothing further needed.

## 2014-03-02 ENCOUNTER — Encounter (HOSPITAL_COMMUNITY): Payer: Self-pay | Admitting: *Deleted

## 2014-03-02 ENCOUNTER — Encounter (HOSPITAL_COMMUNITY)
Admission: RE | Disposition: A | Discharge status: Home / Self Care | Payer: Self-pay | Source: Ambulatory Visit | Attending: Internal Medicine

## 2014-03-02 ENCOUNTER — Encounter (HOSPITAL_COMMUNITY): Payer: Medicare HMO | Admitting: Anesthesiology

## 2014-03-02 ENCOUNTER — Ambulatory Visit (HOSPITAL_COMMUNITY)
Admission: RE | Admit: 2014-03-02 | Discharge: 2014-03-02 | Disposition: A | Discharge status: Home / Self Care | Payer: Medicare HMO | Source: Ambulatory Visit | Attending: Internal Medicine | Admitting: Internal Medicine

## 2014-03-02 ENCOUNTER — Ambulatory Visit (HOSPITAL_COMMUNITY): Payer: Medicare HMO | Admitting: Anesthesiology

## 2014-03-02 DIAGNOSIS — I10 Essential (primary) hypertension: Secondary | ICD-10-CM | POA: Insufficient documentation

## 2014-03-02 DIAGNOSIS — J449 Chronic obstructive pulmonary disease, unspecified: Secondary | ICD-10-CM | POA: Insufficient documentation

## 2014-03-02 DIAGNOSIS — K222 Esophageal obstruction: Secondary | ICD-10-CM | POA: Diagnosis not present

## 2014-03-02 DIAGNOSIS — D649 Anemia, unspecified: Secondary | ICD-10-CM | POA: Diagnosis not present

## 2014-03-02 DIAGNOSIS — K219 Gastro-esophageal reflux disease without esophagitis: Secondary | ICD-10-CM | POA: Insufficient documentation

## 2014-03-02 DIAGNOSIS — Z85038 Personal history of other malignant neoplasm of large intestine: Secondary | ICD-10-CM | POA: Diagnosis not present

## 2014-03-02 DIAGNOSIS — Z87891 Personal history of nicotine dependence: Secondary | ICD-10-CM | POA: Insufficient documentation

## 2014-03-02 DIAGNOSIS — E119 Type 2 diabetes mellitus without complications: Secondary | ICD-10-CM | POA: Insufficient documentation

## 2014-03-02 DIAGNOSIS — R1310 Dysphagia, unspecified: Secondary | ICD-10-CM | POA: Diagnosis present

## 2014-03-02 DIAGNOSIS — J4489 Other specified chronic obstructive pulmonary disease: Secondary | ICD-10-CM | POA: Insufficient documentation

## 2014-03-02 HISTORY — PX: BALLOON DILATION: SHX5330

## 2014-03-02 HISTORY — PX: ESOPHAGOGASTRODUODENOSCOPY: SHX5428

## 2014-03-02 HISTORY — DX: Malignant (primary) neoplasm, unspecified: C80.1

## 2014-03-02 HISTORY — DX: Cardiac arrhythmia, unspecified: I49.9

## 2014-03-02 LAB — GLUCOSE, CAPILLARY: GLUCOSE-CAPILLARY: 147 mg/dL — AB (ref 70–99)

## 2014-03-02 SURGERY — EGD (ESOPHAGOGASTRODUODENOSCOPY)
Anesthesia: Monitor Anesthesia Care

## 2014-03-02 MED ORDER — SODIUM CHLORIDE 0.9 % IV SOLN: INTRAVENOUS | Status: DC

## 2014-03-02 MED ORDER — LIDOCAINE HCL (CARDIAC) 20 MG/ML IV SOLN
INTRAVENOUS | Status: DC | PRN
  Administered 2014-03-02: 100 mg via INTRAVENOUS

## 2014-03-02 MED ORDER — LIDOCAINE HCL (CARDIAC) 20 MG/ML IV SOLN
INTRAVENOUS | Status: AC
  Filled 2014-03-02: qty 5

## 2014-03-02 MED ORDER — LACTATED RINGERS IV SOLN
INTRAVENOUS | Status: DC
  Administered 2014-03-02: 1000 mL via INTRAVENOUS

## 2014-03-02 MED ORDER — PROPOFOL 10 MG/ML IV BOLUS
INTRAVENOUS | Status: AC
  Filled 2014-03-02: qty 20

## 2014-03-02 MED ORDER — BUTAMBEN-TETRACAINE-BENZOCAINE 2-2-14 % EX AERO
INHALATION_SPRAY | CUTANEOUS | Status: DC | PRN
  Administered 2014-03-02: 2 via TOPICAL

## 2014-03-02 MED ORDER — PROMETHAZINE HCL 25 MG/ML IJ SOLN: 6.2500 mg | INTRAMUSCULAR | Status: DC | PRN

## 2014-03-02 MED ORDER — PROPOFOL INFUSION 10 MG/ML OPTIME
INTRAVENOUS | Status: DC | PRN
  Administered 2014-03-02: 50 ug/kg/min via INTRAVENOUS

## 2014-03-02 NOTE — H&P (Signed)
[facility] History and Physical   Primary Care Physician:  Tamsen Roers, MD   Reason for Procedure:   dilate esophageal stricture  Plan:    EGD, balloon dilation     The risks and benefits as well as alternatives of endoscopic procedure(s) have been     discussed and reviewed. All questions answered. The patient agrees to proceed.     HPI: Amanda [name] is a 73 y.o. female with a distal esophageal stricture and dysphagia. Previously dilated to 12 mm with improvement. Here for further dilation.   Past Medical History  Diagnosis Date  . Emphysema lung   . Hyperlipidemia   . GERD (gastroesophageal reflux disease)   . Vitamin D deficiency   . Closed fracture of unspecified part of upper end of humerus   . Complete rupture of rotator cuff     right shoulder- limited range of motion  . Other vitamin B12 deficiency anemia   . Neurogenic bladder, NOS     02-22-14 some bladder issues of urgency is somewhat improve.  Marland Kitchen COPD (chronic obstructive pulmonary disease)   . Type II or unspecified type diabetes mellitus without mention of complication, uncontrolled     diet controlled  . Essential hypertension, benign     off lisinporil for last 2 months  . Esophageal stricture 12/28/2013  . Cancer     colon cancer 01-12-14- no further tx. intended  . Arrhythmia     7-23-15x1beginning of colon surgery 01-12-14 - no problems since.    Past Surgical History  Procedure Laterality Date  . Orif shoulder fracture Right 2011    arthroplasty  . Tonsillectomy and adenoidectomy  age 73 or 34  . Eye surgery Bilateral 2010    both eyes lens replacments  . Esophagogastroduodenoscopy (egd) with propofol N/A 01/09/2014    Procedure: ESOPHAGOGASTRODUODENOSCOPY (EGD) WITH PROPOFOL;  Surgeon: [name], MD;  Location: [facility];  Service: Endoscopy;  Laterality: N/A;  . Colonoscopy N/A 01/10/2014    Procedure: COLONOSCOPY;  Surgeon: [name], MD;  Location: [facility];   Service: Endoscopy;  Laterality: N/A;  . Laparoscopic partial colectomy N/A 01/12/2014    Procedure: LAPAROSCOPIC ASSISTED PARTIAL COLECTOMY AND REMOVAL OF RECTAL POLYP;  Surgeon: Odis Hollingshead, MD;  Location: [facility];  Service: General;  Laterality: N/A;  . Esophagogastroduodenoscopy N/A 01/16/2014    Procedure: ESOPHAGOGASTRODUODENOSCOPY (EGD);  Surgeon: Gatha Mayer, MD;  Location: [facility];  Service: Endoscopy;  Laterality: N/A;  . Esophagogastroduodenoscopy (egd) with propofol N/A 01/19/2014    Procedure: ESOPHAGOGASTRODUODENOSCOPY (EGD) WITH PROPOFOL;  Surgeon: Gatha Mayer, MD;  Location: [facility];  Service: Endoscopy;  Laterality: N/A;  . Cataract extraction, bilateral Bilateral   . Dilation and curettage of uterus      Prior to Admission medications   Medication Sig Start Date End Date Taking? Authorizing Provider  amLODipine (NORVASC) 5 MG tablet Take 5 mg by mouth every morning.   Yes Historical Provider, MD  ferrous sulfate 325 (65 FE) MG tablet Take 1 tablet (325 mg total) by mouth daily. 01/01/14  Yes Wandra Arthurs, MD  Fluticasone-Salmeterol (ADVAIR) 250-50 MCG/DOSE AEPB Inhale 1 puff into the lungs 2 (two) times daily.   Yes Historical Provider, MD  metoprolol tartrate (LOPRESSOR) 25 MG tablet Take 1 tablet (25 mg total) by mouth 2 (two) times daily. 01/21/14  Yes Costin Karlyne Greenspan, MD  omeprazole (PRILOSEC) 40 MG capsule Take 40 mg by mouth daily. 01/21/14  Yes Costin Karlyne Greenspan, MD  mometasone-formoterol (DULERA) 100-5 MCG/ACT AERO Inhale 1 puff into the lungs 2 (two) times daily. 01/29/14   Juanito Doom, MD    Current Facility-Administered Medications  Medication Dose Route Frequency Provider Last Rate Last Dose  . 0.9 %  sodium chloride infusion   Intravenous Continuous Gatha Mayer, MD      . lactated ringers infusion   Intravenous Continuous Ayesha Mohair, MD 125 mL/hr at 03/02/14 0840 1,000 mL at 03/02/14 0840  . promethazine (PHENERGAN) injection  6.25-12.5 mg  6.25-12.5 mg Intravenous Q15 min PRN Ayesha Mohair, MD        Allergies as of 01/30/2014  . (No Known Allergies)    Family History  Problem Relation Age of Onset  . Diabetes Father   . Bone cancer Father     bone marrow  . Emphysema Mother   . Pneumonia Mother     History   Social History  . Marital Status: Married    Spouse Name: N/A    Number of Children: 4  . Years of Education: N/A   Occupational History  . retired    Social History Main Topics  . Smoking status: Former Smoker -- 1.00 packs/day for 20 years    Types: Cigarettes    Quit date: 08/03/2005  . Smokeless tobacco: Never Used  . Alcohol Use: No  . Drug Use: No  . Sexual Activity: No   Other Topics Concern  . 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 2 caffeinated beverages daily.   Lives in [place].    Review of Systems: Positive for dyspnea All other review of systems negative except as mentioned in the HPI.  Physical Exam: Vital signs in last 24 hours: Temp:  [97.6 F (36.4 C)] 97.6 F (36.4 C) (07/31 0835) Pulse Rate:  [19] 19 (07/31 0835) Resp:  [15] 15 (07/31 0835) BP: (161)/(55) 161/55 mmHg (07/31 0835) SpO2:  [99 %] 99 % (07/31 0835) Weight:  [102 lb (46.267 kg)] 102 lb (46.267 kg) (07/31 0835)   General:   Alert,  Well-developed, well-nourished, pleasant and cooperative in NAD Lungs:  Clear throughout to auscultation.   Heart:  Regular rate and rhythm; no murmurs, clicks, rubs,  or gallops. Abdomen:  Soft, nontender and nondistended. Normal bowel sounds.   Neuro/Psych:  Alert and cooperative. Normal mood and affect. A and O x 3   @Azuri Bozard  Simonne Maffucci, MD, [facility] [phone] (pager) 03/02/2014 9:10 AM@

## 2014-03-02 NOTE — Op Note (Signed)
[facility] [place], [address]   ENDOSCOPY PROCEDURE REPORT  PATIENT: Amanda [name], Amanda [name]  MR#: [id number] BIRTHDATE: [birth year] , 29  yrs. old GENDER: Female ENDOSCOPIST: Gatha Mayer, MD, [facility] REFERRED BY: PROCEDURE DATE:  03/02/2014 PROCEDURE:  Esophagoscopy  + balloon dilation < 30 mm ASA CLASS:     Class IV INDICATIONS:  Dysphagia.   Therapeutic procedure. MEDICATIONS: See Anesthesia Report. TOPICAL ANESTHETIC: none  DESCRIPTION OF PROCEDURE: After the risks benefits and alternatives of the procedure were thoroughly explained, informed consent was obtained.  The [place] [id number] endoscope was introduced through the mouth and advanced to the stomach body. Without limitations.  The instrument was slowly withdrawn as the mucosa was fully examined.        ESOPHAGUS: A stricture was found in the lower third of the esophagus.  The stenosis was traversable with the endoscope using gente pressure. The stricture was dilated with a balloon, 12,13.5 and 15 mm with good result.  The remainder of the upper endoscopy exam was otherwise normal. Retroflexion was not performed.     The scope was then withdrawn from the patient and the procedure completed.  COMPLICATIONS: There were no complications. ENDOSCOPIC IMPRESSION: 1.   Stricture was found in the lower third of the esophagus - dilated to 15 mm 2.   The remainder of the upper endoscopy exam was otherwise normal - exam to gastric body  RECOMMENDATIONS: 1.  Clear liquids until noon, then soft foods rest of day.  Resume prior diet tomorrow. 2.  Continue PPI 3.  Office will call to see how the swallwing is - will call you in late August   eSigned:  Gatha Mayer, MD, [facility] 03/02/2014 10:04 AM   VG:KKDPT Little, MD and The Patient

## 2014-03-02 NOTE — Transfer of Care (Signed)
Immediate Anesthesia Transfer of Care Note  Patient: Amanda [name]  Procedure(s) Performed: Procedure(s): ESOPHAGOGASTRODUODENOSCOPY (EGD) (N/A) BALLOON DILATION (N/A)  Patient Location: PACU and Endo  Anesthesia Type:MAC  Level of Consciousness: awake, alert , oriented and patient cooperative  Airway & Oxygen Therapy: Patient Spontanous Breathing and Patient connected to nasal cannula oxygen  Post-op Assessment: Report given to PACU RN, Post -op Vital signs reviewed and stable and Patient moving all extremities  Post vital signs: Reviewed and stable  Complications: No apparent anesthesia complications

## 2014-03-02 NOTE — Discharge Instructions (Signed)
I dilated the stricture more today and I think you will see a big improvement in swallowing.  Liquids only today until noon then soft foods and normal tomorrow.  My office will call you to see how the swallowing is going and see if you need another dilation in September.  I appreciate the opportunity to care for you. Gatha Mayer, MD, FACG  YOU HAD AN ENDOSCOPIC PROCEDURE TODAY: Refer to the procedure report and other information in the discharge instructions given to you for any specific questions about what was found during the examination. If this information does not answer your questions, please call Dr. Celesta Aver office at [phone] to clarify.   YOU SHOULD EXPECT: Some feelings of bloating in the abdomen. Passage of more gas than usual. Walking can help get rid of the air that was put into your GI tract during the procedure and reduce the bloating. If you had a lower endoscopy (such as a colonoscopy or flexible sigmoidoscopy) you may notice spotting of blood in your stool or on the toilet paper. Some abdominal soreness may be present for a day or two, also.  DIET: Your first meal following the procedure should be just clear liquids until noon, then soft foods. Tomorrow it is ok to progress to your normal diet. Drink plenty of fluids but you should avoid alcoholic beverages for 24 hours.   ACTIVITY: Your care partner should take you home directly after the procedure. You should plan to take it easy, moving slowly for the rest of the day. You can resume normal activity the day after the procedure however YOU SHOULD NOT DRIVE, use power tools, machinery or perform tasks that involve climbing or major physical exertion for 24 hours (because of the sedation medicines used during the test).   SYMPTOMS TO REPORT IMMEDIATELY: A gastroenterologist can be reached at any hour. Please call [phone]  for any of the following symptoms:  Following upper endoscopy (EGD, EUS, ERCP, esophageal  dilation) Vomiting of blood or coffee ground material  New, significant abdominal pain  New, significant chest pain or pain under the shoulder blades  Painful or persistently difficult swallowing  New shortness of breath  Black, tarry-looking or red, bloody stools  FOLLOW UP:  If any biopsies were taken you will be contacted by phone or by letter within the next 1-3 weeks. Call [phone]  if you have not heard about the biopsies in 3 weeks.  Please also call with any specific questions about appointments or follow up tests.

## 2014-03-02 NOTE — Anesthesia Preprocedure Evaluation (Addendum)
Anesthesia Evaluation  Patient identified by MRN, date of birth, ID band Patient awake  General Assessment Comment: Emphysema lung     .  Hyperlipidemia     .  GERD (gastroesophageal reflux disease)     .  Vitamin D deficiency     .  Closed fracture of unspecified part of upper end of humerus     .  Complete rupture of rotator cuff     .  Other vitamin B12 deficiency anemia     .  Neurogenic bladder, NOS     .  COPD (chronic obstructive pulmonary disease)     .  Type II or unspecified type diabetes mellitus without mention of complication, uncontrolled         diet controlled   .  Essential hypertension, benign         off lisinporil for last 2 months     Reviewed: Allergy & Precautions, H&P , NPO status , Patient's Chart, lab work & pertinent test results  Airway Mallampati: II TM Distance: >3 FB Neck ROM: Full    Dental no notable dental hx. (+) Edentulous Upper, Edentulous Lower   Pulmonary COPD COPD inhaler, former smoker,  breath sounds clear to auscultation  Pulmonary exam normal       Cardiovascular Exercise Tolerance: Good hypertension, Rhythm:Regular Rate:Normal     Neuro/Psych negative neurological ROS  negative psych ROS   GI/Hepatic Neg liver ROS, GERD-  Medicated,  Endo/Other  diabetes, Type 2  Renal/GU negative Renal ROS  negative genitourinary   Musculoskeletal negative musculoskeletal ROS (+)   Abdominal   Peds negative pediatric ROS (+)  Hematology  (+) anemia , Hgb 9.2   Anesthesia Other Findings   Reproductive/Obstetrics negative OB ROS                         Anesthesia Physical Anesthesia Plan  ASA: III  Anesthesia Plan: MAC   Post-op Pain Management:    Induction: Intravenous  Airway Management Planned: Nasal Cannula  Additional Equipment:   Intra-op Plan:   Post-operative Plan:   Informed Consent: I have reviewed the patients History and Physical,  chart, labs and discussed the procedure including the risks, benefits and alternatives for the proposed anesthesia with the patient or authorized representative who has indicated his/her understanding and acceptance.   Dental advisory given  Plan Discussed with: CRNA  Anesthesia Plan Comments:         Anesthesia Quick Evaluation

## 2014-03-02 NOTE — Anesthesia Postprocedure Evaluation (Signed)
Anesthesia Post Note  Patient: Amanda [name]  Procedure(s) Performed: Procedure(s) (LRB): ESOPHAGOGASTRODUODENOSCOPY (EGD) (N/A) BALLOON DILATION (N/A)  Anesthesia type: MAC  Patient location: PACU  Post pain: Pain level controlled  Post assessment: Post-op Vital signs reviewed  Last Vitals:  Filed Vitals:   03/02/14 1035  BP: 159/63  Pulse: 47  Temp:   Resp: 18    Post vital signs: Reviewed  Level of consciousness: sedated  Complications: No apparent anesthesia complications

## 2014-03-05 ENCOUNTER — Ambulatory Visit (INDEPENDENT_AMBULATORY_CARE_PROVIDER_SITE_OTHER): Payer: Medicare HMO | Admitting: General Surgery

## 2014-03-05 ENCOUNTER — Encounter (HOSPITAL_COMMUNITY): Payer: Self-pay | Admitting: Internal Medicine

## 2014-03-05 VITALS — BP 138/60 | HR 56 | Temp 97.6°F | Resp 14 | Ht 61.0 in | Wt 102.4 lb

## 2014-03-05 DIAGNOSIS — Z4889 Encounter for other specified surgical aftercare: Secondary | ICD-10-CM

## 2014-03-05 NOTE — Patient Instructions (Signed)
May resume normal activities as tolerated, as discussed.  

## 2014-03-05 NOTE — Progress Notes (Signed)
Procedure:  Laparoscopic-assisted right colectomy, transanal excision of rectal polyp.  Date:  01/12/2014  Pathology:  Stage I right colon cancer  History:  She is here for her second postoperative visit.  She was seen by Dr. Osker Mason and no further treatment was recommended.  He is scheduled to see her in January 2016 for followup. She is eating okay. Bowels are moving. She is regaining her strength. Exam: General- Is in NAD. Abdomen-Soft, nontender, incisions are clean, intact, and solid.  Assessment:  Stage I right colon cancer status post partial colectomy. Also had a rectal polyp removed which was benign-she continues to recover well.  Plan:  Resume normal activities as tolerated. Return visit prn.

## 2014-03-28 ENCOUNTER — Encounter (HOSPITAL_COMMUNITY): Payer: Self-pay | Admitting: Emergency Medicine

## 2014-03-28 ENCOUNTER — Inpatient Hospital Stay (HOSPITAL_COMMUNITY)
Admission: EM | Admit: 2014-03-28 | Discharge: 2014-04-01 | DRG: 871 | Disposition: A | Discharge status: Home / Self Care | Payer: Medicare HMO | Attending: Internal Medicine | Admitting: Internal Medicine

## 2014-03-28 ENCOUNTER — Emergency Department (HOSPITAL_COMMUNITY): Payer: Medicare HMO

## 2014-03-28 DIAGNOSIS — J439 Emphysema, unspecified: Secondary | ICD-10-CM

## 2014-03-28 DIAGNOSIS — E119 Type 2 diabetes mellitus without complications: Secondary | ICD-10-CM | POA: Diagnosis present

## 2014-03-28 DIAGNOSIS — Z833 Family history of diabetes mellitus: Secondary | ICD-10-CM | POA: Diagnosis not present

## 2014-03-28 DIAGNOSIS — J189 Pneumonia, unspecified organism: Secondary | ICD-10-CM | POA: Diagnosis present

## 2014-03-28 DIAGNOSIS — E785 Hyperlipidemia, unspecified: Secondary | ICD-10-CM | POA: Diagnosis present

## 2014-03-28 DIAGNOSIS — Z9049 Acquired absence of other specified parts of digestive tract: Secondary | ICD-10-CM

## 2014-03-28 DIAGNOSIS — J42 Unspecified chronic bronchitis: Secondary | ICD-10-CM

## 2014-03-28 DIAGNOSIS — E876 Hypokalemia: Secondary | ICD-10-CM | POA: Diagnosis present

## 2014-03-28 DIAGNOSIS — Z79899 Other long term (current) drug therapy: Secondary | ICD-10-CM | POA: Diagnosis not present

## 2014-03-28 DIAGNOSIS — Z96619 Presence of unspecified artificial shoulder joint: Secondary | ICD-10-CM

## 2014-03-28 DIAGNOSIS — A419 Sepsis, unspecified organism: Secondary | ICD-10-CM | POA: Diagnosis present

## 2014-03-28 DIAGNOSIS — E559 Vitamin D deficiency, unspecified: Secondary | ICD-10-CM | POA: Diagnosis present

## 2014-03-28 DIAGNOSIS — K219 Gastro-esophageal reflux disease without esophagitis: Secondary | ICD-10-CM | POA: Diagnosis present

## 2014-03-28 DIAGNOSIS — T380X5A Adverse effect of glucocorticoids and synthetic analogues, initial encounter: Secondary | ICD-10-CM | POA: Diagnosis present

## 2014-03-28 DIAGNOSIS — I4891 Unspecified atrial fibrillation: Secondary | ICD-10-CM

## 2014-03-28 DIAGNOSIS — N319 Neuromuscular dysfunction of bladder, unspecified: Secondary | ICD-10-CM | POA: Diagnosis present

## 2014-03-28 DIAGNOSIS — C182 Malignant neoplasm of ascending colon: Secondary | ICD-10-CM | POA: Diagnosis present

## 2014-03-28 DIAGNOSIS — I10 Essential (primary) hypertension: Secondary | ICD-10-CM | POA: Diagnosis present

## 2014-03-28 DIAGNOSIS — K2100 Gastro-esophageal reflux disease with esophagitis, without bleeding: Secondary | ICD-10-CM

## 2014-03-28 DIAGNOSIS — C189 Malignant neoplasm of colon, unspecified: Secondary | ICD-10-CM | POA: Diagnosis present

## 2014-03-28 DIAGNOSIS — Z808 Family history of malignant neoplasm of other organs or systems: Secondary | ICD-10-CM | POA: Diagnosis not present

## 2014-03-28 DIAGNOSIS — J441 Chronic obstructive pulmonary disease with (acute) exacerbation: Secondary | ICD-10-CM | POA: Diagnosis present

## 2014-03-28 DIAGNOSIS — IMO0001 Reserved for inherently not codable concepts without codable children: Secondary | ICD-10-CM

## 2014-03-28 DIAGNOSIS — Z9849 Cataract extraction status, unspecified eye: Secondary | ICD-10-CM | POA: Diagnosis not present

## 2014-03-28 DIAGNOSIS — Z87891 Personal history of nicotine dependence: Secondary | ICD-10-CM | POA: Diagnosis not present

## 2014-03-28 DIAGNOSIS — E1165 Type 2 diabetes mellitus with hyperglycemia: Secondary | ICD-10-CM

## 2014-03-28 DIAGNOSIS — R0602 Shortness of breath: Secondary | ICD-10-CM | POA: Diagnosis not present

## 2014-03-28 DIAGNOSIS — J449 Chronic obstructive pulmonary disease, unspecified: Secondary | ICD-10-CM | POA: Diagnosis present

## 2014-03-28 HISTORY — DX: Pneumonia, unspecified organism: J18.9

## 2014-03-28 LAB — COMPREHENSIVE METABOLIC PANEL
ALK PHOS: 78 U/L (ref 39–117)
ALT: 7 U/L (ref 0–35)
AST: 13 U/L (ref 0–37)
Albumin: 3.1 g/dL — ABNORMAL LOW (ref 3.5–5.2)
Anion gap: 14 (ref 5–15)
BILIRUBIN TOTAL: 0.3 mg/dL (ref 0.3–1.2)
BUN: 13 mg/dL (ref 6–23)
CHLORIDE: 95 meq/L — AB (ref 96–112)
CO2: 24 meq/L (ref 19–32)
Calcium: 9.6 mg/dL (ref 8.4–10.5)
Creatinine, Ser: 0.76 mg/dL (ref 0.50–1.10)
GFR calc non Af Amer: 80 mL/min — ABNORMAL LOW (ref >90)
GLUCOSE: 307 mg/dL — AB (ref 70–99)
Potassium: 3.9 mEq/L (ref 3.7–5.3)
SODIUM: 133 meq/L — AB (ref 137–147)
Total Protein: 7.5 g/dL (ref 6.0–8.3)

## 2014-03-28 LAB — URINE MICROSCOPIC-ADD ON

## 2014-03-28 LAB — URINALYSIS, ROUTINE W REFLEX MICROSCOPIC
Bilirubin Urine: NEGATIVE
HGB URINE DIPSTICK: NEGATIVE
Ketones, ur: NEGATIVE mg/dL
Leukocytes, UA: NEGATIVE
Nitrite: NEGATIVE
PH: 5.5 (ref 5.0–8.0)
PROTEIN: 30 mg/dL — AB
Specific Gravity, Urine: 1.027 (ref 1.005–1.030)
Urobilinogen, UA: 0.2 mg/dL (ref 0.0–1.0)

## 2014-03-28 LAB — CBC WITH DIFFERENTIAL/PLATELET
Basophils Absolute: 0 10*3/uL (ref 0.0–0.1)
Basophils Relative: 0 % (ref 0–1)
Eosinophils Absolute: 0.3 10*3/uL (ref 0.0–0.7)
Eosinophils Relative: 2 % (ref 0–5)
HCT: 31.1 % — ABNORMAL LOW (ref 36.0–46.0)
HEMOGLOBIN: 10.4 g/dL — AB (ref 12.0–15.0)
LYMPHS ABS: 1.3 10*3/uL (ref 0.7–4.0)
Lymphocytes Relative: 11 % — ABNORMAL LOW (ref 12–46)
MCH: 27.9 pg (ref 26.0–34.0)
MCHC: 33.4 g/dL (ref 30.0–36.0)
MCV: 83.4 fL (ref 78.0–100.0)
MONOS PCT: 8 % (ref 3–12)
Monocytes Absolute: 1 10*3/uL (ref 0.1–1.0)
NEUTROS ABS: 9.5 10*3/uL — AB (ref 1.7–7.7)
NEUTROS PCT: 79 % — AB (ref 43–77)
Platelets: 333 10*3/uL (ref 150–400)
RBC: 3.73 MIL/uL — ABNORMAL LOW (ref 3.87–5.11)
RDW: 15.1 % (ref 11.5–15.5)
WBC: 12.1 10*3/uL — ABNORMAL HIGH (ref 4.0–10.5)

## 2014-03-28 LAB — BLOOD GAS, ARTERIAL
ACID-BASE EXCESS: 0.2 mmol/L (ref 0.0–2.0)
Bicarbonate: 24.5 mEq/L — ABNORMAL HIGH (ref 20.0–24.0)
Drawn by: 31814
O2 Saturation: 99.6 %
PATIENT TEMPERATURE: 103.1
PCO2 ART: 45.4 mmHg — AB (ref 35.0–45.0)
TCO2: 22.8 mmol/L (ref 0–100)
pH, Arterial: 7.364 (ref 7.350–7.450)
pO2, Arterial: 251 mmHg — ABNORMAL HIGH (ref 80.0–100.0)

## 2014-03-28 LAB — I-STAT CG4 LACTIC ACID, ED: Lactic Acid, Venous: 1.05 mmol/L (ref 0.5–2.2)

## 2014-03-28 LAB — PRO B NATRIURETIC PEPTIDE: PRO B NATRI PEPTIDE: 566.3 pg/mL — AB (ref 0–450)

## 2014-03-28 MED ORDER — VANCOMYCIN HCL IN DEXTROSE 1-5 GM/200ML-% IV SOLN
1000.0000 mg | INTRAVENOUS | Status: DC
  Administered 2014-03-29 – 2014-03-31 (×3): 1000 mg via INTRAVENOUS
  Filled 2014-03-28 (×3): qty 200

## 2014-03-28 MED ORDER — ACETAMINOPHEN 325 MG PO TABS: 650.0000 mg | ORAL_TABLET | Freq: Four times a day (QID) | ORAL | Status: DC | PRN

## 2014-03-28 MED ORDER — SODIUM CHLORIDE 0.9 % IJ SOLN
3.0000 mL | Freq: Two times a day (BID) | INTRAMUSCULAR | Status: DC
  Administered 2014-03-29 – 2014-04-01 (×5): 3 mL via INTRAVENOUS

## 2014-03-28 MED ORDER — DILTIAZEM HCL 25 MG/5ML IV SOLN
10.0000 mg | Freq: Once | INTRAVENOUS | Status: AC
  Administered 2014-03-28: 10 mg via INTRAVENOUS
  Filled 2014-03-28: qty 5

## 2014-03-28 MED ORDER — MOMETASONE FURO-FORMOTEROL FUM 100-5 MCG/ACT IN AERO
2.0000 | INHALATION_SPRAY | Freq: Two times a day (BID) | RESPIRATORY_TRACT | Status: DC
  Filled 2014-03-28: qty 8.8

## 2014-03-28 MED ORDER — METOPROLOL TARTRATE 25 MG PO TABS
25.0000 mg | ORAL_TABLET | Freq: Two times a day (BID) | ORAL | Status: DC
  Administered 2014-03-29 – 2014-03-31 (×6): 25 mg via ORAL
  Filled 2014-03-28 (×9): qty 1

## 2014-03-28 MED ORDER — ALBUTEROL (5 MG/ML) CONTINUOUS INHALATION SOLN
15.0000 mg/h | INHALATION_SOLUTION | RESPIRATORY_TRACT | Status: DC
Start: 2014-03-28 — End: 2014-03-29
  Administered 2014-03-28: 15 mg/h via RESPIRATORY_TRACT
  Filled 2014-03-28: qty 20

## 2014-03-28 MED ORDER — INSULIN ASPART 100 UNIT/ML ~~LOC~~ SOLN: 0.0000 [IU] | Freq: Three times a day (TID) | SUBCUTANEOUS | Status: DC

## 2014-03-28 MED ORDER — ENOXAPARIN SODIUM 40 MG/0.4ML ~~LOC~~ SOLN
40.0000 mg | SUBCUTANEOUS | Status: DC
  Administered 2014-03-29 – 2014-04-01 (×4): 40 mg via SUBCUTANEOUS
  Filled 2014-03-28 (×4): qty 0.4

## 2014-03-28 MED ORDER — SODIUM CHLORIDE 0.9 % IV SOLN
1000.0000 mL | Freq: Once | INTRAVENOUS | Status: AC
  Administered 2014-03-28: 1000 mL via INTRAVENOUS

## 2014-03-28 MED ORDER — FERROUS SULFATE 325 (65 FE) MG PO TABS
325.0000 mg | ORAL_TABLET | Freq: Every day | ORAL | Status: DC
  Administered 2014-03-29 – 2014-04-01 (×4): 325 mg via ORAL
  Filled 2014-03-28 (×4): qty 1

## 2014-03-28 MED ORDER — SODIUM CHLORIDE 0.9 % IV SOLN
INTRAVENOUS | Status: DC
  Filled 2014-03-28 (×10): qty 1000

## 2014-03-28 MED ORDER — DILTIAZEM HCL 25 MG/5ML IV SOLN: 10.0000 mg | Freq: Once | INTRAVENOUS | Status: DC

## 2014-03-28 MED ORDER — ACETAMINOPHEN 650 MG RE SUPP: 650.0000 mg | Freq: Four times a day (QID) | RECTAL | Status: DC | PRN

## 2014-03-28 MED ORDER — PIPERACILLIN-TAZOBACTAM 3.375 G IVPB 30 MIN
3.3750 g | Freq: Once | INTRAVENOUS | Status: AC
  Administered 2014-03-28: 3.375 g via INTRAVENOUS
  Filled 2014-03-28: qty 50

## 2014-03-28 MED ORDER — PIPERACILLIN-TAZOBACTAM 3.375 G IVPB
3.3750 g | Freq: Three times a day (TID) | INTRAVENOUS | Status: DC
  Administered 2014-03-29 – 2014-04-01 (×10): 3.375 g via INTRAVENOUS
  Filled 2014-03-28 (×13): qty 50

## 2014-03-28 MED ORDER — ACETAMINOPHEN 500 MG PO TABS
1000.0000 mg | ORAL_TABLET | Freq: Once | ORAL | Status: AC
  Administered 2014-03-28: 1000 mg via ORAL
  Filled 2014-03-28: qty 2

## 2014-03-28 MED ORDER — SODIUM CHLORIDE 0.9 % IV SOLN
INTRAVENOUS | Status: DC
Start: 2014-03-28 — End: 2014-03-31
  Administered 2014-03-28: 22:00:00 via INTRAVENOUS
  Administered 2014-03-29: 75 mL/h via INTRAVENOUS
  Administered 2014-03-29 – 2014-03-31 (×5): via INTRAVENOUS

## 2014-03-28 MED ORDER — METHYLPREDNISOLONE SODIUM SUCC 125 MG IJ SOLR
125.0000 mg | Freq: Once | INTRAMUSCULAR | Status: AC
  Administered 2014-03-28: 125 mg via INTRAVENOUS
  Filled 2014-03-28: qty 2

## 2014-03-28 MED ORDER — ONDANSETRON HCL 4 MG PO TABS: 4.0000 mg | ORAL_TABLET | Freq: Four times a day (QID) | ORAL | Status: DC | PRN

## 2014-03-28 MED ORDER — ONDANSETRON HCL 4 MG/2ML IJ SOLN: 4.0000 mg | Freq: Four times a day (QID) | INTRAMUSCULAR | Status: DC | PRN

## 2014-03-28 MED ORDER — VANCOMYCIN HCL IN DEXTROSE 1-5 GM/200ML-% IV SOLN
1000.0000 mg | Freq: Once | INTRAVENOUS | Status: AC
  Administered 2014-03-28: 1000 mg via INTRAVENOUS
  Filled 2014-03-28: qty 200

## 2014-03-28 MED ORDER — INSULIN ASPART 100 UNIT/ML ~~LOC~~ SOLN
0.0000 [IU] | Freq: Every day | SUBCUTANEOUS | Status: DC
  Administered 2014-03-29 (×2): 5 [IU] via SUBCUTANEOUS
  Administered 2014-03-31: 2 [IU] via SUBCUTANEOUS

## 2014-03-28 MED ORDER — PIPERACILLIN-TAZOBACTAM 3.375 G IVPB 30 MIN
3.3750 g | Freq: Three times a day (TID) | INTRAVENOUS | Status: DC
  Filled 2014-03-28: qty 50

## 2014-03-28 MED ORDER — PANTOPRAZOLE SODIUM 40 MG PO TBEC
80.0000 mg | DELAYED_RELEASE_TABLET | Freq: Every day | ORAL | Status: DC
  Administered 2014-03-29 – 2014-04-01 (×4): 80 mg via ORAL
  Filled 2014-03-28 (×4): qty 2

## 2014-03-28 MED ORDER — IPRATROPIUM-ALBUTEROL 0.5-2.5 (3) MG/3ML IN SOLN
3.0000 mL | Freq: Four times a day (QID) | RESPIRATORY_TRACT | Status: DC
  Administered 2014-03-29 – 2014-03-30 (×5): 3 mL via RESPIRATORY_TRACT
  Filled 2014-03-28 (×5): qty 3

## 2014-03-28 MED ORDER — DILTIAZEM HCL 100 MG IV SOLR
5.0000 mg/h | INTRAVENOUS | Status: DC
  Administered 2014-03-28: 5 mg/h via INTRAVENOUS

## 2014-03-28 MED ORDER — ASPIRIN EC 81 MG PO TBEC
81.0000 mg | DELAYED_RELEASE_TABLET | Freq: Every day | ORAL | Status: DC
  Administered 2014-03-29 – 2014-04-01 (×4): 81 mg via ORAL
  Filled 2014-03-28 (×4): qty 1

## 2014-03-28 NOTE — ED Notes (Signed)
RN is waiting on pt to urinate.

## 2014-03-28 NOTE — ED Notes (Signed)
Bed: WA24 Expected date:  Expected time:  Means of arrival:  Comments: Hold for triage 

## 2014-03-28 NOTE — Progress Notes (Signed)
ANTIBIOTIC CONSULT NOTE - INITIAL  Pharmacy Consult for Vancomycin and Zosyn Indication: rule out sepsis  No Known Allergies  Patient Measurements: Height: 5\' 1"  (154.9 cm) Weight: 100 lb (45.36 kg) IBW/kg (Calculated) : 47.8  Vital Signs: Temp: 103.1 F (39.5 C) (08/26 2004) Temp src: Rectal (08/26 2004) BP: 147/62 mmHg (08/26 2004) Pulse Rate: 94 (08/26 2004) Intake/Output from previous day:   Intake/Output from this shift:    Labs: No results found for this basename: WBC, HGB, PLT, LABCREA, CREATININE,  in the last 72 hours Estimated Creatinine Clearance: 40.5 ml/min (by C-G formula based on Cr of 0.86). No results found for this basename: VANCOTROUGH, VANCOPEAK, VANCORANDOM, GENTTROUGH, GENTPEAK, GENTRANDOM, TOBRATROUGH, TOBRAPEAK, TOBRARND, AMIKACINPEAK, AMIKACINTROU, AMIKACIN,  in the last 72 hours   Microbiology: No results found for this or any previous visit (from the past 720 hour(s)).  Medical History: Past Medical History  Diagnosis Date  . Emphysema lung   . Hyperlipidemia   . GERD (gastroesophageal reflux disease)   . Vitamin D deficiency   . Closed fracture of unspecified part of upper end of humerus   . Complete rupture of rotator cuff     right shoulder- limited range of motion  . Other vitamin B12 deficiency anemia   . Neurogenic bladder, NOS     02-22-14 some bladder issues of urgency is somewhat improve.  Marland Kitchen COPD (chronic obstructive pulmonary disease)   . Type II or unspecified type diabetes mellitus without mention of complication, uncontrolled     diet controlled  . Essential hypertension, benign     off lisinporil for last 2 months  . Esophageal stricture 12/28/2013  . Cancer     colon cancer 01-12-14- no further tx. intended  . Arrhythmia     7-23-15x1beginning of colon surgery 01-12-14 - no problems since.    Medications:  Scheduled:  . acetaminophen  1,000 mg Oral Once  . methylPREDNISolone (SOLU-MEDROL) injection  125 mg Intravenous  Once   Infusions:  . sodium chloride    . albuterol    . piperacillin-tazobactam    . vancomycin     PRN:   Assessment: 73 yo female presents with shortness of breath x 3 days. Pharmacy consulted to dose vancomycin and zosyn for sepsis.  8/26 >> Vanc >> 8/26 >> Zosyn >>  Tmax: 103.1 WBC: Renal: SCr 0.76, CrCl 43 ml/min Lactate: 1.05  8/26 blood x2: 8/26 urine:  Goal of Therapy:  Vancomycin trough level 15-20 mcg/ml Zosyn dose per renal function  Plan:   Vancomycin 1g IV given in ED, then continue with 1g IV q24h Zosyn 3.375gm IV q8h (4hr extended infusions) Follow up renal function & cultures, clinical course  Peggyann Juba, PharmD, BCPS Pager: [phone] 03/28/2014,8:06 PM

## 2014-03-28 NOTE — H&P (Signed)
History and Physical  Amanda [name] ENI:[id number] DOB: [birth year] DOA: 03/28/2014   PCP: Tamsen Roers, MD   Chief Complaint: Shortness of breath  HPI:  73 year old female with a history of colon cancer status post right hemicolectomy in June 2015, COPD, hyperlipidemia, diet-controlled diabetes, hypertension presents with 3 days of shortness of breath and associated fevers and chills. The patient states that she developed a fever up to 102.18F on the day of admission. She's been complaining of a nonproductive cough without hemoptysis. She denies any chest discomfort, nausea, vomiting, diarrhea, abdominal pain, dysuria, hematuria. She complains of headache but denies any blurred vision or focal extremity weakness. Her appetite has been good. In the emergency department, the patient was given Solu-Medrol and albuterol. Unfortunately, the patient developed atrial fibrillation with RVR with heart rate up to 180. Chest x-ray revealed right upper lobe opacity. ABG showed 7.36/45/251/24 on 8 L mask. WBC was 12.1. CMP was unremarkable except for sodium of 133. Patient had a fever of 103.52F. Blood cultures were obtained and the patient started on vancomycin and Zosyn. In addition, the patient was started on a diltiazem drip and IV fluids.  Assessment/Plan: Sepsis -Secondary to healthcare associated pneumonia -Blood cultures x2 sets--obtained in the emergency department -Empiric vancomycin and Zosyn pending culture data -Unfortunately, a urine could not be obtained in the emergency department  -IV fluids  Healthcare associated pneumonia  -Supplemental oxygen  -IV antibiotics  -Pulmonary hygiene  -Aerosolized albuterol and Atrovent Atrial fibrillation with RVR  -01/17/2014 echocardiogram EF 50-55%. No WMA -CHADSVASc=5 -will need to discuss risks and benefits of anticoagulation with patient -continue ASA for now -Patient had previous PAF during her admission in June, but the decision was made  to hold off on anticoagulation at that time -TSH -Diltiazem drip  Hypertension  -Hold amlodipine as the patient is on diltiazem  -Continue metoprolol tartrate  Diabetes mellitus type 2  -Hemoglobin A1c--6.8 on 01/09/14  -NovoLog sliding scale  -Patient was previously diet controlled        Past Medical History  Diagnosis Date  . Emphysema lung   . Hyperlipidemia   . GERD (gastroesophageal reflux disease)   . Vitamin D deficiency   . Closed fracture of unspecified part of upper end of humerus   . Complete rupture of rotator cuff     right shoulder- limited range of motion  . Other vitamin B12 deficiency anemia   . Neurogenic bladder, NOS     02-22-14 some bladder issues of urgency is somewhat improve.  Marland Kitchen COPD (chronic obstructive pulmonary disease)   . Type II or unspecified type diabetes mellitus without mention of complication, uncontrolled     diet controlled  . Essential hypertension, benign     off lisinporil for last 2 months  . Esophageal stricture 12/28/2013  . Cancer     colon cancer 01-12-14- no further tx. intended  . Arrhythmia     7-23-15x1beginning of colon surgery 01-12-14 - no problems since.   Past Surgical History  Procedure Laterality Date  . Orif shoulder fracture Right 2011    arthroplasty  . Tonsillectomy and adenoidectomy  age 67 or 45  . Eye surgery Bilateral 2010    both eyes lens replacments  . Esophagogastroduodenoscopy (egd) with propofol N/A 01/09/2014    Procedure: ESOPHAGOGASTRODUODENOSCOPY (EGD) WITH PROPOFOL;  Surgeon: [name], MD;  Location: [facility];  Service: Endoscopy;  Laterality: N/A;  . Colonoscopy N/A 01/10/2014    Procedure: COLONOSCOPY;  Surgeon: Sandy Salaam  Deatra Ina, MD;  Location: [facility];  Service: Endoscopy;  Laterality: N/A;  . Laparoscopic partial colectomy N/A 01/12/2014    Procedure: LAPAROSCOPIC ASSISTED PARTIAL COLECTOMY AND REMOVAL OF RECTAL POLYP;  Surgeon: Odis Hollingshead, MD;  Location: [facility];  Service:  General;  Laterality: N/A;  . Esophagogastroduodenoscopy N/A 01/16/2014    Procedure: ESOPHAGOGASTRODUODENOSCOPY (EGD);  Surgeon: Gatha Mayer, MD;  Location: [facility];  Service: Endoscopy;  Laterality: N/A;  . Esophagogastroduodenoscopy (egd) with propofol N/A 01/19/2014    Procedure: ESOPHAGOGASTRODUODENOSCOPY (EGD) WITH PROPOFOL;  Surgeon: Gatha Mayer, MD;  Location: [facility];  Service: Endoscopy;  Laterality: N/A;  . Cataract extraction, bilateral Bilateral   . Dilation and curettage of uterus    . Esophagogastroduodenoscopy N/A 03/02/2014    Procedure: ESOPHAGOGASTRODUODENOSCOPY (EGD);  Surgeon: Gatha Mayer, MD;  Location: [facility];  Service: Endoscopy;  Laterality: N/A;  . Balloon dilation N/A 03/02/2014    Procedure: BALLOON DILATION;  Surgeon: Gatha Mayer, MD;  Location: [facility];  Service: Endoscopy;  Laterality: N/A;  . Colon surgery     Social History:  reports that she quit smoking about 8 years ago. Her smoking use included Cigarettes. She has a 20 pack-year smoking history. She has never used smokeless tobacco. She reports that she does not drink alcohol or use illicit drugs.   Family History  Problem Relation Age of Onset  . Diabetes Father   . Bone cancer Father     bone marrow  . Emphysema Mother   . Pneumonia Mother      No Known Allergies    Prior to Admission medications   Medication Sig Start Date End Date Taking? Authorizing Provider  amLODipine (NORVASC) 5 MG tablet Take 5 mg by mouth every morning.   Yes Historical Provider, MD  ferrous sulfate 325 (65 FE) MG tablet Take 1 tablet (325 mg total) by mouth daily. 01/01/14  Yes Wandra Arthurs, MD  Fluticasone-Salmeterol (ADVAIR) 250-50 MCG/DOSE AEPB Inhale 1 puff into the lungs 2 (two) times daily.   Yes Historical Provider, MD  metoprolol tartrate (LOPRESSOR) 25 MG tablet Take 1 tablet (25 mg total) by mouth 2 (two) times daily. 01/21/14  Yes Costin Karlyne Greenspan, MD  omeprazole (PRILOSEC) 40 MG  capsule Take 40 mg by mouth daily. 01/21/14  Yes Costin Karlyne Greenspan, MD    Review of Systems:  Constitutional:  No weight loss, night sweats Head&Eyes: No headache.  No vision loss.  No eye pain or scotoma ENT:  No Difficulty swallowing,Tooth/dental problems,Sore throat,   Cardio-vascular:  No chest pain, Orthopnea, PND, swelling in lower extremities,   palpitations  GI:  No  abdominal pain vomiting, diarrhea, loss of appetite, hematochezia, melena, heartburn, indigestion, Resp:   No coughing up of blood .No wheezing.No chest wall deformity  Skin:  no rash or lesions.  GU:  no dysuria, change in color of urine, no urgency or frequency. No flank pain.  Musculoskeletal:  No joint pain or swelling. No decreased range of motion. No back pain.  Psych:  No change in mood or affect. Neurologic: No headache, no dysesthesia, no focal weakness, no vision loss. No syncope  Physical Exam: Filed Vitals:   03/28/14 2200 03/28/14 2215 03/28/14 2230 03/28/14 2254  BP: 120/43 115/41 106/94   Pulse: 160  28   Temp:    99.7 F (37.6 C)  TempSrc:    Rectal  Resp: 24 21 19    Height:      Weight:  SpO2: 99%  92%    General:  A&O x 3, NAD, nontoxic, pleasant/cooperative Head/Eye: No conjunctival hemorrhage, no icterus, [place]/AT, No nystagmus ENT:  No icterus,  No thrush,  no pharyngeal exudate Neck:  No masses, no lymphadenpathy, no meningismus CV:  IRRR, no rub, no gallop Lung:  Bibasilar crackles, right greater than left. No wheezing.  Abdomen: soft/NT, +BS, nondistended, no peritoneal signs Ext: No cyanosis, No rashes, No petechiae, No lymphangitis, No edema Neuro: CNII-XII intact, strength 4/5 in bilateral upper and lower extremities, no dysmetria  Labs on Admission:  Basic Metabolic Panel:  Recent Labs Lab 03/28/14 2004  NA 133*  K 3.9  CL 95*  CO2 24  GLUCOSE 307*  BUN 13  CREATININE 0.76  CALCIUM 9.6   Liver Function Tests:  Recent Labs Lab 03/28/14 2004  AST 13    ALT 7  ALKPHOS 78  BILITOT 0.3  PROT 7.5  ALBUMIN 3.1*   No results found for this basename: LIPASE, AMYLASE,  in the last 168 hours No results found for this basename: AMMONIA,  in the last 168 hours CBC:  Recent Labs Lab 03/28/14 2008  WBC 12.1*  NEUTROABS 9.5*  HGB 10.4*  HCT 31.1*  MCV 83.4  PLT 333   Cardiac Enzymes: No results found for this basename: CKTOTAL, CKMB, CKMBINDEX, TROPONINI,  in the last 168 hours BNP: No components found with this basename: POCBNP,  CBG: No results found for this basename: GLUCAP,  in the last 168 hours  Radiological Exams on Admission: Dg Chest Port 1 View  03/28/2014   CLINICAL DATA:  Shortness of breath and COPD.  EXAM: PORTABLE CHEST - 1 VIEW  COMPARISON:  01/14/2014.  FINDINGS: 2126 hrs. Interval development of right upper lobe airspace disease, compatible with pneumonia. Left lung is clear. No evidence for pleural effusion. The cardiopericardial silhouette is within normal limits for size. Telemetry leads overlie the chest. Patient is status post right shoulder replacement.  IMPRESSION: Airspace disease in the right upper lobe compatible with pneumonia. Followup imaging is recommended to ensure resolution.   Electronically Signed   By: Misty Stanley M.D.   On: 03/28/2014 21:35    EKG: Independently reviewed. A. Fibrillation, nonspecific ST changes    Time spent:60 minutes Code Status:   FULL Family Communication:   Husband updated at bedside   Masao Junker, DO  [facility] Pager [phone]  If 7PM-7AM, please contact night-coverage [email or web address] Password [facility] 03/28/2014, 11:28 PM

## 2014-03-28 NOTE — ED Notes (Signed)
Pt c/o of SHOB x 3-5 days, short sentences, denies productive cough.

## 2014-03-28 NOTE — ED Provider Notes (Signed)
TIME SEEN: 8:05 PM  CHIEF COMPLAINT: Shortness of breath, fever  HPI: Patient a 73 year old female with history of hypertension, hyperlipidemia, emphysema and COPD who no longer smokes, colon cancer who presents to the emergency department with complaints of shortness of breath for the past 3-5 days, fever at home of 1 or 2. Denies any chest pain or chest discomfort. No abdominal pain. No vomiting or diarrhea. No history of PE or DVT. No lower extremity swelling or pain. No sick contacts.  ROS: See HPI Constitutional: no fever  Eyes: no drainage  ENT: no runny nose   Cardiovascular:  no chest pain  Resp:  SOB  GI: no vomiting GU: no dysuria Integumentary: no rash  Allergy: no hives  Musculoskeletal: no leg swelling  Neurological: no slurred speech ROS otherwise negative  PAST MEDICAL HISTORY/PAST SURGICAL HISTORY:  Past Medical History  Diagnosis Date  . Emphysema lung   . Hyperlipidemia   . GERD (gastroesophageal reflux disease)   . Vitamin D deficiency   . Closed fracture of unspecified part of upper end of humerus   . Complete rupture of rotator cuff     right shoulder- limited range of motion  . Other vitamin B12 deficiency anemia   . Neurogenic bladder, NOS     02-22-14 some bladder issues of urgency is somewhat improve.  Marland Kitchen COPD (chronic obstructive pulmonary disease)   . Type II or unspecified type diabetes mellitus without mention of complication, uncontrolled     diet controlled  . Essential hypertension, benign     off lisinporil for last 2 months  . Esophageal stricture 12/28/2013  . Cancer     colon cancer 01-12-14- no further tx. intended  . Arrhythmia     7-23-15x1beginning of colon surgery 01-12-14 - no problems since.    MEDICATIONS:  Prior to Admission medications   Medication Sig Start Date End Date Taking? Authorizing Provider  amLODipine (NORVASC) 5 MG tablet Take 5 mg by mouth every morning.    Historical Provider, MD  ferrous sulfate 325 (65 FE) MG  tablet Take 1 tablet (325 mg total) by mouth daily. 01/01/14   Wandra Arthurs, MD  Fluticasone-Salmeterol (ADVAIR) 250-50 MCG/DOSE AEPB Inhale 1 puff into the lungs 2 (two) times daily.    Historical Provider, MD  metoprolol tartrate (LOPRESSOR) 25 MG tablet Take 1 tablet (25 mg total) by mouth 2 (two) times daily. 01/21/14   Costin Karlyne Greenspan, MD  omeprazole (PRILOSEC) 40 MG capsule Take 40 mg by mouth daily. 01/21/14   Costin Karlyne Greenspan, MD    ALLERGIES:  No Known Allergies  SOCIAL HISTORY:  History  Substance Use Topics  . Smoking status: Former Smoker -- 1.00 packs/day for 20 years    Types: Cigarettes    Quit date: 08/03/2005  . Smokeless tobacco: Never Used  . Alcohol Use: No    FAMILY HISTORY: Family History  Problem Relation Age of Onset  . Diabetes Father   . Bone cancer Father     bone marrow  . Emphysema Mother   . Pneumonia Mother     EXAM: BP 145/57  Pulse 110  Temp(Src) 99.5 F (37.5 C)  Resp 22  Ht 5\' 1"  (1.549 m)  Wt 100 lb (45.36 kg)  BMI 18.90 kg/m2  SpO2 91% CONSTITUTIONAL: Alert and oriented and responds appropriately to questions. Well-appearing; well-nourished HEAD: Normocephalic EYES: Conjunctivae clear, PERRL ENT: normal nose; no rhinorrhea; moist mucous membranes; pharynx without lesions noted NECK: Supple, no meningismus, no LAD  CARD: Irregularly irregular and tachycardic; S1 and S2 appreciated; no murmurs, no clicks, no rubs, no gallops RESP: Normal chest excursion without splinting, patient is tachypneic, in mild to moderate respiratory distress, speaking short sentences, decreased breath sounds diffusely ABD/GI: Normal bowel sounds; non-distended; soft, non-tender, no rebound, no guarding, well-healed surgical scar in the Center of the abdomen BACK:  The back appears normal and is non-tender to palpation, there is no CVA tenderness EXT: Normal ROM in all joints; non-tender to palpation; no edema; normal capillary refill; no cyanosis    SKIN:  Normal color for age and race; warm NEURO: Moves all extremities equally, sensation to light touch intact diffusely PSYCH: The patient's mood and manner are appropriate. Grooming and personal hygiene are appropriate.  MEDICAL DECISION MAKING: Patient here with COPD exacerbation and likely pneumonia. She is tachycardic and febrile. She is also hypoxic. We'll give albuterol, Solu-Medrol. We'll also start septic workup and give broad-spectrum antibiotics. We'll obtain labs, cultures chest x-ray. Patient will need admission.  ED PROGRESS: Labs show leukocytosis of 12.1 with left shift. Chest x-ray shows a right upper lobe pneumonia. Lactate normal. ABG is reassuring. Discussed with Dr. Carles Collet with hospitalist service for admission. Patient is now more tachycardic after albuterol but I feel this was needed given her respiratory status. We'll give diltiazem and continue IV hydration. Her blood pressure is stable and normal.    Date: 03/28/2014 19:50  Rate: 154  Rhythm: Atrial fibrillation  QRS Axis: normal  Intervals: normal  ST/T Wave abnormalities: Diffuse ST depression  Conduction Disutrbances: none  Narrative Interpretation: A. fib with RVR, diffuse ST depression consistent with strain pattern    CRITICAL CARE Performed by: Nyra Jabs   Total critical care time: 45 minutes  Critical care time was exclusive of separately billable procedures and treating other patients.  Critical care was necessary to treat or prevent imminent or life-threatening deterioration.  Critical care was time spent personally by me on the following activities: development of treatment plan with patient and/or surrogate as well as nursing, discussions with consultants, evaluation of patient's response to treatment, examination of patient, obtaining history from patient or surrogate, ordering and performing treatments and interventions, ordering and review of laboratory studies, ordering and review of radiographic  studies, pulse oximetry and re-evaluation of patient's condition.     Derma, DO 03/28/14 2320

## 2014-03-29 DIAGNOSIS — J42 Unspecified chronic bronchitis: Secondary | ICD-10-CM

## 2014-03-29 DIAGNOSIS — E876 Hypokalemia: Secondary | ICD-10-CM

## 2014-03-29 DIAGNOSIS — I10 Essential (primary) hypertension: Secondary | ICD-10-CM

## 2014-03-29 DIAGNOSIS — IMO0001 Reserved for inherently not codable concepts without codable children: Secondary | ICD-10-CM

## 2014-03-29 DIAGNOSIS — E1165 Type 2 diabetes mellitus with hyperglycemia: Secondary | ICD-10-CM

## 2014-03-29 DIAGNOSIS — J441 Chronic obstructive pulmonary disease with (acute) exacerbation: Secondary | ICD-10-CM

## 2014-03-29 LAB — BASIC METABOLIC PANEL
Anion gap: 14 (ref 5–15)
BUN: 13 mg/dL (ref 6–23)
CALCIUM: 8.9 mg/dL (ref 8.4–10.5)
CO2: 21 meq/L (ref 19–32)
Chloride: 97 mEq/L (ref 96–112)
Creatinine, Ser: 0.82 mg/dL (ref 0.50–1.10)
GFR calc Af Amer: 79 mL/min — ABNORMAL LOW (ref >90)
GFR calc non Af Amer: 68 mL/min — ABNORMAL LOW (ref >90)
GLUCOSE: 390 mg/dL — AB (ref 70–99)
Potassium: 3.6 mEq/L — ABNORMAL LOW (ref 3.7–5.3)
SODIUM: 132 meq/L — AB (ref 137–147)

## 2014-03-29 LAB — URINE CULTURE
CULTURE: NO GROWTH
Colony Count: NO GROWTH

## 2014-03-29 LAB — CBC
HEMATOCRIT: 29.4 % — AB (ref 36.0–46.0)
Hemoglobin: 9.4 g/dL — ABNORMAL LOW (ref 12.0–15.0)
MCH: 27.3 pg (ref 26.0–34.0)
MCHC: 32 g/dL (ref 30.0–36.0)
MCV: 85.5 fL (ref 78.0–100.0)
Platelets: 286 10*3/uL (ref 150–400)
RBC: 3.44 MIL/uL — ABNORMAL LOW (ref 3.87–5.11)
RDW: 15.2 % (ref 11.5–15.5)
WBC: 10.4 10*3/uL (ref 4.0–10.5)

## 2014-03-29 LAB — HEMOGLOBIN A1C
Hgb A1c MFr Bld: 7.2 % — ABNORMAL HIGH (ref <5.7)
Mean Plasma Glucose: 160 mg/dL — ABNORMAL HIGH (ref <117)

## 2014-03-29 LAB — GLUCOSE, CAPILLARY
GLUCOSE-CAPILLARY: 327 mg/dL — AB (ref 70–99)
GLUCOSE-CAPILLARY: 299 mg/dL — AB (ref 70–99)
GLUCOSE-CAPILLARY: 371 mg/dL — AB (ref 70–99)
Glucose-Capillary: 233 mg/dL — ABNORMAL HIGH (ref 70–99)
Glucose-Capillary: 392 mg/dL — ABNORMAL HIGH (ref 70–99)

## 2014-03-29 LAB — MRSA PCR SCREENING: MRSA BY PCR: NEGATIVE

## 2014-03-29 LAB — T4, FREE: Free T4: 1.15 ng/dL (ref 0.80–1.80)

## 2014-03-29 LAB — PROTIME-INR
INR: 1.09 (ref 0.00–1.49)
Prothrombin Time: 14.1 seconds (ref 11.6–15.2)

## 2014-03-29 LAB — TSH: TSH: 0.313 u[IU]/mL — AB (ref 0.350–4.500)

## 2014-03-29 MED ORDER — DILTIAZEM HCL 100 MG IV SOLR
5.0000 mg/h | INTRAVENOUS | Status: DC
  Filled 2014-03-29: qty 100

## 2014-03-29 MED ORDER — POTASSIUM CHLORIDE CRYS ER 20 MEQ PO TBCR
40.0000 meq | EXTENDED_RELEASE_TABLET | ORAL | Status: AC
  Administered 2014-03-29: 40 meq via ORAL
  Filled 2014-03-29: qty 2

## 2014-03-29 MED ORDER — BUDESONIDE 0.25 MG/2ML IN SUSP
0.2500 mg | Freq: Two times a day (BID) | RESPIRATORY_TRACT | Status: DC
  Administered 2014-03-29 – 2014-04-01 (×7): 0.25 mg via RESPIRATORY_TRACT
  Filled 2014-03-29 (×13): qty 2

## 2014-03-29 MED ORDER — ZOLPIDEM TARTRATE 5 MG PO TABS
5.0000 mg | ORAL_TABLET | Freq: Once | ORAL | Status: AC
  Administered 2014-03-29: 5 mg via ORAL
  Filled 2014-03-29: qty 1

## 2014-03-29 MED ORDER — INSULIN ASPART 100 UNIT/ML ~~LOC~~ SOLN
0.0000 [IU] | Freq: Three times a day (TID) | SUBCUTANEOUS | Status: DC
  Administered 2014-03-29: 8 [IU] via SUBCUTANEOUS
  Administered 2014-03-29: 11 [IU] via SUBCUTANEOUS
  Administered 2014-03-29: 5 [IU] via SUBCUTANEOUS
  Administered 2014-03-30: 3 [IU] via SUBCUTANEOUS
  Administered 2014-03-30: 8 [IU] via SUBCUTANEOUS
  Administered 2014-03-30: 11 [IU] via SUBCUTANEOUS
  Administered 2014-03-31: 5 [IU] via SUBCUTANEOUS
  Administered 2014-03-31: 15 [IU] via SUBCUTANEOUS
  Administered 2014-03-31: 2 [IU] via SUBCUTANEOUS

## 2014-03-29 MED ORDER — PIPERACILLIN-TAZOBACTAM 3.375 G IVPB
3.3750 g | Freq: Three times a day (TID) | INTRAVENOUS | Status: DC
Start: 2014-03-29 — End: 2014-03-29

## 2014-03-29 MED ORDER — DILTIAZEM HCL ER 60 MG PO CP12
60.0000 mg | ORAL_CAPSULE | Freq: Two times a day (BID) | ORAL | Status: DC
  Administered 2014-03-29 – 2014-03-30 (×3): 60 mg via ORAL
  Filled 2014-03-29 (×6): qty 1

## 2014-03-29 MED ORDER — CETYLPYRIDINIUM CHLORIDE 0.05 % MT LIQD
7.0000 mL | Freq: Two times a day (BID) | OROMUCOSAL | Status: DC
Start: 2014-03-29 — End: 2014-04-01
  Administered 2014-03-29 – 2014-04-01 (×7): 7 mL via OROMUCOSAL

## 2014-03-29 MED ORDER — METHYLPREDNISOLONE SODIUM SUCC 40 MG IJ SOLR
40.0000 mg | Freq: Three times a day (TID) | INTRAMUSCULAR | Status: DC
  Administered 2014-03-29 – 2014-03-30 (×4): 40 mg via INTRAVENOUS
  Filled 2014-03-29 (×7): qty 1

## 2014-03-29 NOTE — Progress Notes (Signed)
Received from SDU, pat alert and oriented, on O2 @2L , noted dyspnea on exertion.  Agree with Previous Rn's assessment.

## 2014-03-29 NOTE — Consult Note (Addendum)
Name: Amanda [name] is a 73 y.o. female Admit date: 03/28/2014 Referring Physician:  D. Tat, MD/ Carmelina Noun, MD Primary Physician:  Tamsen Roers, MD Primary Cardiologist:  None  Reason for Consultation:  Atrial fibrillation  ASSESSMENT:  1. Recurrent atrial fibrillation with RVR, ? Acute stress related versus PAF. Now back in NSR 2. Febrile illness with possible PNA and sepsis 3. Recent hemicolectomy 4. Diabetes mellitus, type II and complicated 5. COPD 6. H/O GI bleeding 6. Hypertension   PLAN:  1. As an outpatient, switch amlodipine to diltiazem CD 180 mg daily, to slow rate if recurrent A Fib. 2. Needs OP continuous ambulatory monitor to r/o PAF. 3. BNP 4. If AF documented under circumstances other than acute illness, she would be a candidate for anticoagulation therapy since her CHADS is 3. If she has recurrent atrial fibrillation during this hospital stay, I would start systemic anticoagulation which could then be discontinued at a later date if appropriate. Given the history of GI bleeding, however, I would try to avoid anticoagulation if at all possible. 5. Aspirin therapy if it is felt safe by treating team.   HPI: The patient was admitted to the hospital with an acute febrile illness and developed atrial fib with a rapid ventricular response. She is now back in normal sinus rhythm after being started on IV diltiazem. She has a prior history of a similar episode in July when she underwent colectomy. She does not recognize any episodes similar to these outside of acute illness episodes. As far she knows there've only been 2 episodes. She experiences palpitations when atrial fibrillation is present. Is no history of congestive heart failure. She has not had syncope. She denies recent blood in her stool although there is a history of GI bleeding which is difficult for me to document based on her chart.  PMH:   Past Medical History  Diagnosis Date  . Emphysema lung   .  Hyperlipidemia   . GERD (gastroesophageal reflux disease)   . Vitamin D deficiency   . Closed fracture of unspecified part of upper end of humerus   . Complete rupture of rotator cuff     right shoulder- limited range of motion  . Other vitamin B12 deficiency anemia   . Neurogenic bladder, NOS     02-22-14 some bladder issues of urgency is somewhat improve.  Marland Kitchen COPD (chronic obstructive pulmonary disease)   . Type II or unspecified type diabetes mellitus without mention of complication, uncontrolled     diet controlled  . Essential hypertension, benign     off lisinporil for last 2 months  . Esophageal stricture 12/28/2013  . Cancer     colon cancer 01-12-14- no further tx. intended  . Arrhythmia     7-23-15x1beginning of colon surgery 01-12-14 - no problems since.    PSH:   Past Surgical History  Procedure Laterality Date  . Orif shoulder fracture Right 2011    arthroplasty  . Tonsillectomy and adenoidectomy  age 73 or 26  . Eye surgery Bilateral 2010    both eyes lens replacments  . Esophagogastroduodenoscopy (egd) with propofol N/A 01/09/2014    Procedure: ESOPHAGOGASTRODUODENOSCOPY (EGD) WITH PROPOFOL;  Surgeon: [name], MD;  Location: [facility];  Service: Endoscopy;  Laterality: N/A;  . Colonoscopy N/A 01/10/2014    Procedure: COLONOSCOPY;  Surgeon: [name], MD;  Location: [facility];  Service: Endoscopy;  Laterality: N/A;  . Laparoscopic partial colectomy N/A 01/12/2014    Procedure: LAPAROSCOPIC ASSISTED  PARTIAL COLECTOMY AND REMOVAL OF RECTAL POLYP;  Surgeon: Odis Hollingshead, MD;  Location: [facility];  Service: General;  Laterality: N/A;  . Esophagogastroduodenoscopy N/A 01/16/2014    Procedure: ESOPHAGOGASTRODUODENOSCOPY (EGD);  Surgeon: Gatha Mayer, MD;  Location: [facility];  Service: Endoscopy;  Laterality: N/A;  . Esophagogastroduodenoscopy (egd) with propofol N/A 01/19/2014    Procedure: ESOPHAGOGASTRODUODENOSCOPY (EGD) WITH PROPOFOL;  Surgeon: Gatha Mayer, MD;  Location: [facility];  Service: Endoscopy;  Laterality: N/A;  . Cataract extraction, bilateral Bilateral   . Dilation and curettage of uterus    . Esophagogastroduodenoscopy N/A 03/02/2014    Procedure: ESOPHAGOGASTRODUODENOSCOPY (EGD);  Surgeon: Gatha Mayer, MD;  Location: [facility];  Service: Endoscopy;  Laterality: N/A;  . Balloon dilation N/A 03/02/2014    Procedure: BALLOON DILATION;  Surgeon: Gatha Mayer, MD;  Location: [facility];  Service: Endoscopy;  Laterality: N/A;  . Colon surgery     Allergies:  Review of patient's allergies indicates no known allergies. Prior to Admit Meds:   Prescriptions prior to admission  Medication Sig Dispense Refill  . amLODipine (NORVASC) 5 MG tablet Take 5 mg by mouth every morning.      . ferrous sulfate 325 (65 FE) MG tablet Take 1 tablet (325 mg total) by mouth daily.  30 tablet  0  . Fluticasone-Salmeterol (ADVAIR) 250-50 MCG/DOSE AEPB Inhale 1 puff into the lungs 2 (two) times daily.      . metoprolol tartrate (LOPRESSOR) 25 MG tablet Take 1 tablet (25 mg total) by mouth 2 (two) times daily.  60 tablet  1  . omeprazole (PRILOSEC) 40 MG capsule Take 40 mg by mouth daily.       Fam HX:    Family History  Problem Relation Age of Onset  . Diabetes Father   . Bone cancer Father     bone marrow  . Emphysema Mother   . Pneumonia Mother    Social HX:    History   Social History  . Marital Status: Married    Spouse Name: N/A    Number of Children: 4  . Years of Education: N/A   Occupational History  . retired    Social History Main Topics  . Smoking status: Former Smoker -- 1.00 packs/day for 20 years    Types: Cigarettes    Quit date: 08/03/2005  . Smokeless tobacco: Never Used  . Alcohol Use: No  . Drug Use: No  . Sexual Activity: No   Other Topics Concern  . 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 2 caffeinated beverages daily.   Lives in  [place].     Review of Systems: No history of stroke or transient neurological complaints. 2 episodes of atrial fibrillation have been documented once in July and again during this hospital stay. She denies claudication. There is a history of COPD. He is on chronic beta blocker therapy as an outpatient.  Physical Exam: Blood pressure 111/39, pulse 71, temperature 97.6 F (36.4 C), temperature source Oral, resp. rate 14, height 5\' 1"  (1.549 m), weight 106 lb 0.7 oz (48.1 kg), SpO2 99.00%. Weight change:   Frail and chronically ill appearing Heart rate is now 70 and regular Neck veins are flat Chest is clear without wheezing Cardiac exam reveals no gallop or murmur Extremities reveal no edema Neurological exam reveals no focal deficit Labs: Lab Results  Component Value Date   WBC 10.4 03/29/2014  HGB 9.4* 03/29/2014   HCT 29.4* 03/29/2014   MCV 85.5 03/29/2014   PLT 286 03/29/2014    Recent Labs Lab 03/28/14 2004 03/29/14 0335  NA 133* 132*  K 3.9 3.6*  CL 95* 97  CO2 24 21  BUN 13 13  CREATININE 0.76 0.82  CALCIUM 9.6 8.9  PROT 7.5  --   BILITOT 0.3  --   ALKPHOS 78  --   ALT 7  --   AST 13  --   GLUCOSE 307* 390*   No results found for this basename: PTT   Lab Results  Component Value Date   INR 1.09 03/29/2014   INR 0.89 01/08/2014   INR 0.90 07/15/2010   No results found for this basename: CKTOTAL, CKMB, CKMBINDEX, TROPONINI     Lab Results  Component Value Date   CHOL  Value: 120        ATP III CLASSIFICATION:  <200     mg/dL   Desirable  200-239  mg/dL   Borderline High  >=240    mg/dL   High        07/17/2010   Lab Results  Component Value Date   HDL 46 07/17/2010   Lab Results  Component Value Date   LDLCALC  Value: 61        Total Cholesterol/HDL:CHD Risk Coronary Heart Disease Risk Table                     Men   Women  1/2 Average Risk   3.4   3.3  Average Risk       5.0   4.4  2 X Average Risk   9.6   7.1  3 X Average Risk  23.4   11.0        Use  the calculated Patient Ratio above and the CHD Risk Table to determine the patient's CHD Risk.        ATP III CLASSIFICATION (LDL):  <100     mg/dL   Optimal  100-129  mg/dL   Near or Above                    Optimal  130-159  mg/dL   Borderline  160-189  mg/dL   High  >190     mg/dL   Very High 07/17/2010   Lab Results  Component Value Date   TRIG 67 07/17/2010   Lab Results  Component Value Date   CHOLHDL 2.6 07/17/2010   No results found for this basename: LDLDIRECT      Radiology:  Dg Chest Port 1 View  03/28/2014   CLINICAL DATA:  Shortness of breath and COPD.  EXAM: PORTABLE CHEST - 1 VIEW  COMPARISON:  01/14/2014.  FINDINGS: 2126 hrs. Interval development of right upper lobe airspace disease, compatible with pneumonia. Left lung is clear. No evidence for pleural effusion. The cardiopericardial silhouette is within normal limits for size. Telemetry leads overlie the chest. Patient is status post right shoulder replacement.  IMPRESSION: Airspace disease in the right upper lobe compatible with pneumonia. Followup imaging is recommended to ensure resolution.   Electronically Signed   By: Misty Stanley M.D.   On: 03/28/2014 21:35    EKG:  Atrial fib with RVR. No acute STTW changes  ECHOCARDIOGRAM: 01/2014 Study Conclusions  - Left ventricle: The cavity size was normal. Systolic function was normal. The estimated ejection fraction was in the range of 50% to 55%. Wall  motion was normal; there were no regional wall motion abnormalities. Left ventricular diastolic function parameters were normal. - Aortic valve: Trileaflet; normal thickness leaflets. There was no regurgitation. - Mitral valve: Mildly thickened leaflets . There was mild regurgitation. - Right ventricle: Systolic function was normal. - Right atrium: The atrium was normal in size. - Tricuspid valve: There was mild regurgitation. - Pulmonary arteries: Systolic pressure was within the normal range. - Pericardium,  extracardiac: There was no pericardial effusion.   Sinclair Grooms 03/29/2014 9:34 AM

## 2014-03-29 NOTE — Progress Notes (Signed)
CARE MANAGEMENT NOTE 03/29/2014  Patient:  Amanda [name], Amanda [name]   Account Number:  [id number]  Date Initiated:  03/29/2014  Documentation initiated by:  Chyna Kneece  Subjective/Objective Assessment:   pt with fever of 1203.0 and cough, presented with increased wob and dyspnea.  Was found to be in A.Fib with RVR admitted and placed on Iv Cardizem     Action/Plan:   patient is from home and has a good family support/recent hx of colectomy 0615/hx of DM.   Anticipated DC Date:  03/30/2014   Anticipated DC Plan:  HOME/SELF CARE  In-house referral  NA      DC Planning Services  CM consult      PAC Choice  NA   Choice offered to / List presented to:  NA   DME arranged  NA      DME agency  NA     [place] arranged  NA  NA      [place] agency  NA   Status of service:  In process, will continue to follow Medicare Important Message given?  NA - LOS <3 / Initial given by admissions (If response is "NO", the following Medicare IM given date [place] will be blank) Date Medicare IM given:   Medicare IM given by:   Date Additional Medicare IM given:   Additional Medicare IM given by:    Discharge Disposition:    Per UR Regulation:  Reviewed for med. necessity/level of care/duration of stay  If discussed at [place] of Stay Meetings, dates discussed:    Comments:  Suanne Marker Amanda Thoma,RN,BSN,CCM: [id number] being transferred to to the telemetry floor/iv cardizem transitioned to p.o./main dx at this time is now pna and sepsis.

## 2014-03-29 NOTE — Progress Notes (Signed)
Clinical Social Work Department BRIEF PSYCHOSOCIAL ASSESSMENT 03/29/2014  Patient:  Amanda [name], Amanda [name]     Account Number:  [id number]     Admit date:  03/28/2014  Clinical Social Worker:  Ulyess Blossom  Date/Time:  03/29/2014 04:09 PM  Referred by:  Physician  Date Referred:  03/29/2014 Referred for  Advanced Directives   Other Referral:   Interview type:  Patient Other interview type:    PSYCHOSOCIAL DATA Living Status:  HUSBAND Admitted from facility:   Level of care:   Primary support name:  Clair Gulling Alleyne/spouse/[phone] Primary support relationship to patient:  SPOUSE Degree of support available:   adequate    CURRENT CONCERNS Current Concerns  Post-Acute Placement   Other Concerns:    SOCIAL WORK ASSESSMENT / PLAN CSW received referral for Advanced Directives.    CSW met with pt at bedside. CSW introduced self and explained role. CSW discussed that CSW received notification that pt was interested in completing Advanced Directives. CSW had brought Advanced Directives packet to bedside, but pt already had one at bedside. CSW reviewed content of the packet with pt and encouraged pt to read through packet and determine what she would like to complete. Pt expressed understanding and would like time to review the packet. Pt states that she plans to take advance directives packet home upon discharge and complete once she is home and feeling well. CSW expressed understanding and provided support.    No further social work needs identified at this time.    CSW signing off.   Assessment/plan status:  No Further Intervention Required Other assessment/ plan:   Information/referral to community resources:   Garment/textile technologist    PATIENT'S/FAMILY'S RESPONSE TO PLAN OF CARE: Pt alert and oriented x 4. Pt appreciative of CSW visit, but states that she plans to take Advanced Directives packet home with her upon discharge in order to sit down and thoroughly review the packet.     CSW signed off.    Amanda [name], MSW, [place] [facility] [phone]

## 2014-03-29 NOTE — Progress Notes (Addendum)
[facility] PROGRESS NOTE  Amanda [name] FKC:[id number] DOB: 07-Apr-1941 DOA: 03/28/2014 PCP: Tamsen Roers, MD  Assessment/Plan: Sepsis  -Secondary to healthcare associated pneumonia  -Blood cultures x2 sets--obtained in the emergency department  -will continue Empiric vancomycin and Zosyn pending culture data  -continue supportive care  Healthcare associated pneumonia  -continue supplemental oxygen  -IV antibiotics (vanc and zosyn) -Pulmonary hygiene, flutter valve and pulmicort  -Aerosolized albuterol and Atrovent   Atrial fibrillation with RVR  -01/17/2014 echocardiogram EF 50-55%. No WMA  -CHADSVASc= 3-4  -Patient had previous PAF during her admission in June, but the decision was made to hold off on anticoagulation at that time. -cardiology consulted for decision regarding use of eliquis for prevention. Will let cardiology to help [place] with final decision -continue ASA for now  -TSH slightly low (will check Free T4) -Diltiazem to be transition to PO  Hypertension  -Hold amlodipine as the patient is on diltiazem  -Continue metoprolol tartrate   Diabetes mellitus type 2  -Hemoglobin A1c--6.8 on 01/09/14  -will continue SSI (will increased to moderate sliding scale)  -Patient was previously diet controlled; might required oral agents at discharge  Hypokalemia -will replete and follow trend  COPD with exacerbation -most likely exacerbated due to PNA -will start low solumedrol, continue abx's and use pulmicort and albuterol/atrovent nebulizer treatment   Code Status: Full Family Communication: no family at bedside Disposition Plan: will transfer out of stepdown unit, telemetry bed requested   Consultants:  Cardiology curbside (due to episode of a. Fib with RVR and needs for anticoagulation)  Procedures:  See below for x-ray reports   Antibiotics:  Vancomycin  Zosyn   HPI/Subjective: Feeling better this morning; no fever, no CP. Also denies nausea,  vomiting and abd pain. Patient still SOB, with exp wheezing and experiencing difficulty speaking in full sentences. HR is now back to sinus rhythm (rate in the 60's)  Objective: Filed Vitals:   03/29/14 0612  BP: 111/40  Pulse: 62  Temp:   Resp: 20    Intake/Output Summary (Last 24 hours) at 03/29/14 0834 Last data filed at 03/29/14 0700  Gross per 24 hour  Intake 1029.41 ml  Output    100 ml  Net 929.41 ml   Filed Weights   03/28/14 1944 03/29/14 0000  Weight: 45.36 kg (100 lb) 48.1 kg (106 lb 0.7 oz)    Exam:   General:  Feeling better, still with SOB and difficulty speaking in full sentences. No fever.  Cardiovascular: regular rate, s1 and S2, no rubs or gallops  Respiratory: diffuse rhonchi and positive exp wheezing; no crackles  Abdomen: soft, NT, ND, positive BS  Musculoskeletal: no edema, no cyanosis or clubbing   Data Reviewed: Basic Metabolic Panel:  Recent Labs Lab 03/28/14 2004 03/29/14 0335  NA 133* 132*  K 3.9 3.6*  CL 95* 97  CO2 24 21  GLUCOSE 307* 390*  BUN 13 13  CREATININE 0.76 0.82  CALCIUM 9.6 8.9   Liver Function Tests:  Recent Labs Lab 03/28/14 2004  AST 13  ALT 7  ALKPHOS 78  BILITOT 0.3  PROT 7.5  ALBUMIN 3.1*   CBC:  Recent Labs Lab 03/28/14 2008 03/29/14 0335  WBC 12.1* 10.4  NEUTROABS 9.5*  --   HGB 10.4* 9.4*  HCT 31.1* 29.4*  MCV 83.4 85.5  PLT 333 286   BNP (last 3 results)  Recent Labs  01/01/14 1100 03/28/14 2006  PROBNP 1711.0* 566.3*   CBG:  Recent Labs Lab 03/29/14 0018  GLUCAP 392*    Recent Results (from the past 240 hour(s))  MRSA PCR SCREENING     Status: None   Collection Time    03/29/14 12:08 AM      Result Value Ref Range Status   MRSA by PCR NEGATIVE  NEGATIVE Final   Comment:            The GeneXpert MRSA Assay (FDA     approved for NASAL specimens     only), is one component of a     comprehensive MRSA colonization     surveillance program. It is not     intended to  diagnose MRSA     infection nor to guide or     monitor treatment for     MRSA infections.     Studies: Dg Chest Port 1 View  03/28/2014   CLINICAL DATA:  Shortness of breath and COPD.  EXAM: PORTABLE CHEST - 1 VIEW  COMPARISON:  01/14/2014.  FINDINGS: 2126 hrs. Interval development of right upper lobe airspace disease, compatible with pneumonia. Left lung is clear. No evidence for pleural effusion. The cardiopericardial silhouette is within normal limits for size. Telemetry leads overlie the chest. Patient is status post right shoulder replacement.  IMPRESSION: Airspace disease in the right upper lobe compatible with pneumonia. Followup imaging is recommended to ensure resolution.   Electronically Signed   By: Misty Stanley M.D.   On: 03/28/2014 21:35    Scheduled Meds: . antiseptic oral rinse  7 mL Mouth Rinse BID  . aspirin EC  81 mg Oral Daily  . budesonide (PULMICORT) nebulizer solution  0.25 mg Nebulization BID  . diltiazem  60 mg Oral Q12H  . enoxaparin (LOVENOX) injection  40 mg Subcutaneous Q24H  . ferrous sulfate  325 mg Oral Daily  . insulin aspart  0-15 Units Subcutaneous TID WC  . insulin aspart  0-5 Units Subcutaneous QHS  . ipratropium-albuterol  3 mL Nebulization Q6H  . methylPREDNISolone (SOLU-MEDROL) injection  40 mg Intravenous 3 times per day  . metoprolol tartrate  25 mg Oral BID  . pantoprazole  80 mg Oral Daily  . piperacillin-tazobactam (ZOSYN)  IV  3.375 g Intravenous Q8H  . potassium chloride  40 mEq Oral Q4H  . sodium chloride  3 mL Intravenous Q12H  . vancomycin  1,000 mg Intravenous Q24H   Continuous Infusions: . sodium chloride 125 mL/hr at 03/29/14 0558  . diltiazem (CARDIZEM) infusion    . sodium chloride 0.9 % 1,000 mL infusion Stopped (03/29/14 0559)    Active Problems:   COPD (chronic obstructive pulmonary disease)   Hyperlipidemia   Right colon cancer (Stage I) s/p partial colectomy 01/12/14   Sepsis   HCAP (healthcare-associated pneumonia)    Atrial fibrillation with RVR   Type II or unspecified type diabetes mellitus without mention of complication, not stated as uncontrolled   Unspecified essential hypertension    Time spent: >30 minutes    Barton Dubois  [facility] Pager [phone]. If 7PM-7AM, please contact night-coverage at [email or web address], password [facility] 03/29/2014, 8:34 AM  LOS: 1 day

## 2014-03-30 DIAGNOSIS — I10 Essential (primary) hypertension: Secondary | ICD-10-CM

## 2014-03-30 LAB — CBC
HCT: 26.7 % — ABNORMAL LOW (ref 36.0–46.0)
Hemoglobin: 8.8 g/dL — ABNORMAL LOW (ref 12.0–15.0)
MCH: 27.8 pg (ref 26.0–34.0)
MCHC: 33 g/dL (ref 30.0–36.0)
MCV: 84.5 fL (ref 78.0–100.0)
PLATELETS: 303 10*3/uL (ref 150–400)
RBC: 3.16 MIL/uL — ABNORMAL LOW (ref 3.87–5.11)
RDW: 15.2 % (ref 11.5–15.5)
WBC: 17.2 10*3/uL — ABNORMAL HIGH (ref 4.0–10.5)

## 2014-03-30 LAB — BASIC METABOLIC PANEL
ANION GAP: 9 (ref 5–15)
BUN: 18 mg/dL (ref 6–23)
CALCIUM: 9.2 mg/dL (ref 8.4–10.5)
CO2: 22 meq/L (ref 19–32)
CREATININE: 1 mg/dL (ref 0.50–1.10)
Chloride: 104 mEq/L (ref 96–112)
GFR calc Af Amer: 62 mL/min — ABNORMAL LOW (ref >90)
GFR, EST NON AFRICAN AMERICAN: 54 mL/min — AB (ref >90)
GLUCOSE: 307 mg/dL — AB (ref 70–99)
Potassium: 4.8 mEq/L (ref 3.7–5.3)
SODIUM: 135 meq/L — AB (ref 137–147)

## 2014-03-30 LAB — GLUCOSE, CAPILLARY
GLUCOSE-CAPILLARY: 330 mg/dL — AB (ref 70–99)
Glucose-Capillary: 271 mg/dL — ABNORMAL HIGH (ref 70–99)
Glucose-Capillary: 167 mg/dL — ABNORMAL HIGH (ref 70–99)
Glucose-Capillary: 150 mg/dL — ABNORMAL HIGH (ref 70–99)

## 2014-03-30 MED ORDER — IPRATROPIUM-ALBUTEROL 0.5-2.5 (3) MG/3ML IN SOLN
3.0000 mL | Freq: Two times a day (BID) | RESPIRATORY_TRACT | Status: DC
Start: 2014-03-30 — End: 2014-03-31
  Administered 2014-03-30 (×2): 3 mL via RESPIRATORY_TRACT
  Filled 2014-03-30 (×2): qty 3

## 2014-03-30 MED ORDER — ZOLPIDEM TARTRATE 5 MG PO TABS
5.0000 mg | ORAL_TABLET | Freq: Once | ORAL | Status: AC
  Administered 2014-03-30: 5 mg via ORAL
  Filled 2014-03-30: qty 1

## 2014-03-30 MED ORDER — METHYLPREDNISOLONE SODIUM SUCC 40 MG IJ SOLR
20.0000 mg | Freq: Two times a day (BID) | INTRAMUSCULAR | Status: DC
  Administered 2014-03-30 – 2014-03-31 (×2): 20 mg via INTRAVENOUS
  Filled 2014-03-30 (×4): qty 0.5

## 2014-03-30 NOTE — Progress Notes (Signed)
     SUBJECTIVE: c/o cough and dyspnea. No chest pain.   BP 108/40  Pulse 65  Temp(Src) 97.8 F (36.6 C) (Oral)  Resp 19  Ht 5\' 1"  (1.549 m)  Wt 109 lb 2 oz (49.5 kg)  BMI 20.63 kg/m2  SpO2 100%  Intake/Output Summary (Last 24 hours) at 03/30/14 4580 Last data filed at 03/30/14 0600  Gross per 24 hour  Intake 2791.67 ml  Output    450 ml  Net 2341.67 ml    PHYSICAL EXAM General: Well developed, well nourished, in no acute distress. Alert and oriented x 3.  Psych:  Good affect, responds appropriately Neck: No JVD. No masses noted.  Lungs: Clear bilaterally with no wheezes or rhonci noted.  Heart: RRR with no murmurs noted. Abdomen: Bowel sounds are present. Soft, non-tender.  Extremities: No lower extremity edema.   LABS: Basic Metabolic Panel:  Recent Labs  03/29/14 0335 03/30/14 0440  NA 132* 135*  K 3.6* 4.8  CL 97 104  CO2 21 22  GLUCOSE 390* 307*  BUN 13 18  CREATININE 0.82 1.00  CALCIUM 8.9 9.2   CBC:  Recent Labs  03/28/14 2008 03/29/14 0335 03/30/14 0440  WBC 12.1* 10.4 17.2*  NEUTROABS 9.5*  --   --   HGB 10.4* 9.4* 8.8*  HCT 31.1* 29.4* 26.7*  MCV 83.4 85.5 84.5  PLT 333 286 303   Current Meds: . antiseptic oral rinse  7 mL Mouth Rinse BID  . aspirin EC  81 mg Oral Daily  . budesonide (PULMICORT) nebulizer solution  0.25 mg Nebulization BID  . diltiazem  60 mg Oral Q12H  . enoxaparin (LOVENOX) injection  40 mg Subcutaneous Q24H  . ferrous sulfate  325 mg Oral Daily  . insulin aspart  0-15 Units Subcutaneous TID WC  . insulin aspart  0-5 Units Subcutaneous QHS  . ipratropium-albuterol  3 mL Nebulization BID  . methylPREDNISolone (SOLU-MEDROL) injection  40 mg Intravenous 3 times per day  . metoprolol tartrate  25 mg Oral BID  . pantoprazole  80 mg Oral Daily  . piperacillin-tazobactam (ZOSYN)  IV  3.375 g Intravenous Q8H  . sodium chloride  3 mL Intravenous Q12H  . vancomycin  1,000 mg Intravenous Q24H     ASSESSMENT AND PLAN:  73 yo female admitted with acute febrile illness and developed atrial fib with RVR. She converted to NSR with IV Cardizem. She was seen as a consult by Dr. Pernell Dupre on 03/29/14.   1. Atrial fib with RVR: Sinus this am. Please see full consult note yesterday for all details. ASA has been recommended. No long term anti-coagulation at this time due to brief episode of atrial fib in setting of febrile illness and recent GI bleeding/hemicolectomy. Continue diltiazem and Lopressor. Would convert Diltiazem to long acting form today (Cardizem CD 120 mg po Qdaily). She will need outpatient follow up with Dr. Tamala Julian in the [facility] office 3-4 weeks post discharge. She will need to have a 30 day event monitor arranged following discharge. We can help arrange this at the time of her discharge.  We will follow this weekend to be involved in the planning process.    MCALHANY,CHRISTOPHER  8/28/20156:49 AM

## 2014-03-30 NOTE — Progress Notes (Signed)
[facility] PROGRESS NOTE  Amanda [name] [id number] DOB: January 19, 1941 DOA: 03/28/2014 PCP: Tamsen Roers, MD  Assessment/Plan: Sepsis  -Secondary to healthcare associated pneumonia  -Blood cultures x2 sets--obtained in the emergency department; no growth up to date -will continue Empiric vancomycin and Zosyn pending culture data  -continue supportive care  Healthcare associated pneumonia  -continue supplemental oxygen  -IV antibiotics (vanc and zosyn) -Pulmonary hygiene, flutter valve and pulmicort  -Aerosolized albuterol and Atrovent   Atrial fibrillation with RVR  -01/17/2014 echocardiogram EF 50-55%. No WMA  -CHADSVASc= 3-4  -Patient had previous PAF during her admission in June, but the decision was made to hold off on anticoagulation at that time. -cardiology consulted for decision regarding use of anticoagulation therapy and plan is to assess with event monitor for 30 days after discharge before starting long term anticoagulation . (patient hx of GI and hemicolectomy)  -continue ASA for now  -TSH slightly low, but free T4 WNL -Diltiazem to be transition to long acting  Hypertension  -Hold amlodipine as the patient is on diltiazem -diltiazem changed to long acting  -Continue metoprolol tartrate   Diabetes mellitus type 2  -Hemoglobin A1c--6.8 on 01/09/14  -will continue SSI (will increased to moderate sliding scale)  -Patient was previously diet controlled; might required oral agents at discharge (like amaryl)  Hypokalemia -will replete and follow trend  COPD with exacerbation -most likely exacerbated due to PNA -will continue solumedrol (with tapering process) -continue abx's and use pulmicort and albuterol/atrovent nebulizer treatment -breathing better and currently w/o wheezing   Code Status: Full Family Communication: no family at bedside Disposition Plan: remains inpatient. PT to assess patient capacity for ADL's in anticipation to determine next venue at  discharge   Consultants:  Cardiology curbside (due to episode of a. Fib with RVR and needs for anticoagulation)  Procedures:  See below for x-ray reports   Antibiotics:  Vancomycin  Zosyn   HPI/Subjective: Feeling better this morning; no fever and no CP. Reports breathing is improving. HR remains sinus and rate controlled.  Objective: Filed Vitals:   03/30/14 1045  BP: 119/42  Pulse: 75  Temp:   Resp:     Intake/Output Summary (Last 24 hours) at 03/30/14 1106 Last data filed at 03/30/14 0600  Gross per 24 hour  Intake   2255 ml  Output      0 ml  Net   2255 ml   Filed Weights   03/28/14 1944 03/29/14 0000 03/29/14 1106  Weight: 45.36 kg (100 lb) 48.1 kg (106 lb 0.7 oz) 49.5 kg (109 lb 2 oz)    Exam:   General:  Feeling better, improve breathing and capacity of speaking in full sentences. No fever.  Cardiovascular: regular rate, s1 and S2, no rubs or gallops  Respiratory: improve air movement, no wheezing; no frank crackles  Abdomen: soft, NT, ND, positive BS  Musculoskeletal: no edema, no cyanosis or clubbing   Data Reviewed: Basic Metabolic Panel:  Recent Labs Lab 03/28/14 2004 03/29/14 0335 03/30/14 0440  NA 133* 132* 135*  K 3.9 3.6* 4.8  CL 95* 97 104  CO2 24 21 22   GLUCOSE 307* 390* 307*  BUN 13 13 18   CREATININE 0.76 0.82 1.00  CALCIUM 9.6 8.9 9.2   Liver Function Tests:  Recent Labs Lab 03/28/14 2004  AST 13  ALT 7  ALKPHOS 78  BILITOT 0.3  PROT 7.5  ALBUMIN 3.1*   CBC:  Recent Labs Lab 03/28/14 2008 03/29/14 0335 03/30/14 0440  WBC 12.1*  10.4 17.2*  NEUTROABS 9.5*  --   --   HGB 10.4* 9.4* 8.8*  HCT 31.1* 29.4* 26.7*  MCV 83.4 85.5 84.5  PLT 333 286 303   BNP (last 3 results)  Recent Labs  01/01/14 1100 03/28/14 2006  PROBNP 1711.0* 566.3*   CBG:  Recent Labs Lab 03/29/14 0810 03/29/14 1321 03/29/14 1636 03/29/14 2226 03/30/14 0747  GLUCAP 327* 299* 233* 371* 271*    Recent Results (from the  past 240 hour(s))  CULTURE, BLOOD (ROUTINE X 2)     Status: None   Collection Time    03/28/14  8:08 PM      Result Value Ref Range Status   Specimen Description BLOOD BLOOD RIGHT FOREARM   Final   Special Requests BOTTLES DRAWN AEROBIC AND ANAEROBIC 5CC   Final   Culture  Setup Time     Final   Value: 03/29/2014 01:35     Performed at [facility]   Culture     Final   Value:        BLOOD CULTURE RECEIVED NO GROWTH TO DATE CULTURE WILL BE HELD FOR 5 DAYS BEFORE ISSUING A FINAL NEGATIVE REPORT     Performed at [facility]   Report Status PENDING   Incomplete  CULTURE, BLOOD (ROUTINE X 2)     Status: None   Collection Time    03/28/14  8:09 PM      Result Value Ref Range Status   Specimen Description BLOOD LEFT HAND   Final   Special Requests BOTTLES DRAWN AEROBIC AND ANAEROBIC 6 CC   Final   Culture  Setup Time     Final   Value: 03/29/2014 01:35     Performed at [facility]   Culture     Final   Value:        BLOOD CULTURE RECEIVED NO GROWTH TO DATE CULTURE WILL BE HELD FOR 5 DAYS BEFORE ISSUING A FINAL NEGATIVE REPORT     Performed at [facility]   Report Status PENDING   Incomplete  URINE CULTURE     Status: None   Collection Time    03/28/14 10:29 PM      Result Value Ref Range Status   Specimen Description URINE, CLEAN CATCH   Final   Special Requests NONE   Final   Culture  Setup Time     Final   Value: 03/29/2014 01:35     Performed at [facility]     Final   Value: NO GROWTH     Performed at [facility]   Culture     Final   Value: NO GROWTH     Performed at [facility]   Report Status 03/29/2014 FINAL   Final  MRSA PCR SCREENING     Status: None   Collection Time    03/29/14 12:08 AM      Result Value Ref Range Status   MRSA by PCR NEGATIVE  NEGATIVE Final   Comment:            The GeneXpert MRSA Assay (FDA     approved for NASAL specimens     only), is one component of a      comprehensive MRSA colonization     surveillance program. It is not     intended to diagnose MRSA     infection nor to guide or     monitor treatment  for     MRSA infections.     Studies: Dg Chest Port 1 View  03/28/2014   CLINICAL DATA:  Shortness of breath and COPD.  EXAM: PORTABLE CHEST - 1 VIEW  COMPARISON:  01/14/2014.  FINDINGS: 2126 hrs. Interval development of right upper lobe airspace disease, compatible with pneumonia. Left lung is clear. No evidence for pleural effusion. The cardiopericardial silhouette is within normal limits for size. Telemetry leads overlie the chest. Patient is status post right shoulder replacement.  IMPRESSION: Airspace disease in the right upper lobe compatible with pneumonia. Followup imaging is recommended to ensure resolution.   Electronically Signed   By: Misty Stanley M.D.   On: 03/28/2014 21:35    Scheduled Meds: . antiseptic oral rinse  7 mL Mouth Rinse BID  . aspirin EC  81 mg Oral Daily  . budesonide (PULMICORT) nebulizer solution  0.25 mg Nebulization BID  . diltiazem  60 mg Oral Q12H  . enoxaparin (LOVENOX) injection  40 mg Subcutaneous Q24H  . ferrous sulfate  325 mg Oral Daily  . insulin aspart  0-15 Units Subcutaneous TID WC  . insulin aspart  0-5 Units Subcutaneous QHS  . ipratropium-albuterol  3 mL Nebulization BID  . methylPREDNISolone (SOLU-MEDROL) injection  20 mg Intravenous Q12H  . metoprolol tartrate  25 mg Oral BID  . pantoprazole  80 mg Oral Daily  . piperacillin-tazobactam (ZOSYN)  IV  3.375 g Intravenous Q8H  . sodium chloride  3 mL Intravenous Q12H  . vancomycin  1,000 mg Intravenous Q24H   Continuous Infusions: . sodium chloride 75 mL/hr at 03/30/14 0725  . sodium chloride 0.9 % 1,000 mL infusion Stopped (03/29/14 0559)    Active Problems:   COPD (chronic obstructive pulmonary disease)   Hyperlipidemia   Right colon cancer (Stage I) s/p partial colectomy 01/12/14   Sepsis   HCAP (healthcare-associated pneumonia)    Atrial fibrillation with RVR   Type II or unspecified type diabetes mellitus without mention of complication, not stated as uncontrolled   Unspecified essential hypertension    Time spent: >30 minutes    Barton Dubois  [facility] Pager [phone]. If 7PM-7AM, please contact night-coverage at [email or web address], password [facility] 03/30/2014, 11:06 AM  LOS: 2 days

## 2014-03-30 NOTE — Progress Notes (Signed)
Results for LEMON, WHITACRE (MRN [id number]) as of 03/30/2014 14:24  Ref. Range 03/29/2014 13:21 03/29/2014 16:36 03/29/2014 22:26 03/30/2014 07:47 03/30/2014 12:18  Glucose-Capillary Latest Range: 70-99 mg/dL 299 (H) 233 (H) 371 (H) 271 (H) 330 (H)   Hyperglycemia with steroids. DM2 diet-controlled at home. HgbA1C - 7.2%  Recommendations:  Add Novolog 3 units tidwc for meal coverage insulin while on steroids.  Will follow. Thank you. Lorenda Peck, RD, LDN, CDE Inpatient Diabetes Coordinator [phone]

## 2014-03-31 LAB — GLUCOSE, CAPILLARY
GLUCOSE-CAPILLARY: 246 mg/dL — AB (ref 70–99)
GLUCOSE-CAPILLARY: 359 mg/dL — AB (ref 70–99)
GLUCOSE-CAPILLARY: 126 mg/dL — AB (ref 70–99)
GLUCOSE-CAPILLARY: 229 mg/dL — AB (ref 70–99)

## 2014-03-31 LAB — VANCOMYCIN, TROUGH: VANCOMYCIN TR: 12.6 ug/mL (ref 10.0–20.0)

## 2014-03-31 MED ORDER — DILTIAZEM HCL ER COATED BEADS 120 MG PO CP24
120.0000 mg | ORAL_CAPSULE | Freq: Every day | ORAL | Status: DC
  Administered 2014-03-31: 120 mg via ORAL
  Filled 2014-03-31 (×2): qty 1

## 2014-03-31 MED ORDER — LEVALBUTEROL HCL 0.63 MG/3ML IN NEBU
0.6300 mg | INHALATION_SOLUTION | Freq: Four times a day (QID) | RESPIRATORY_TRACT | Status: DC | PRN
  Administered 2014-03-31: 0.63 mg via RESPIRATORY_TRACT
  Filled 2014-03-31: qty 3

## 2014-03-31 MED ORDER — METHYLPREDNISOLONE SODIUM SUCC 40 MG IJ SOLR
20.0000 mg | Freq: Two times a day (BID) | INTRAMUSCULAR | Status: AC
  Administered 2014-03-31: 20 mg via INTRAVENOUS
  Filled 2014-03-31: qty 0.5

## 2014-03-31 MED ORDER — PREDNISONE 20 MG PO TABS
40.0000 mg | ORAL_TABLET | Freq: Every day | ORAL | Status: DC
Start: 2014-04-01 — End: 2014-04-01
  Administered 2014-04-01: 40 mg via ORAL
  Filled 2014-03-31 (×3): qty 2

## 2014-03-31 MED ORDER — LEVALBUTEROL HCL 0.63 MG/3ML IN NEBU
0.6300 mg | INHALATION_SOLUTION | Freq: Two times a day (BID) | RESPIRATORY_TRACT | Status: DC
  Administered 2014-03-31 – 2014-04-01 (×3): 0.63 mg via RESPIRATORY_TRACT
  Filled 2014-03-31 (×4): qty 3

## 2014-03-31 MED ORDER — LEVALBUTEROL HCL 0.63 MG/3ML IN NEBU
INHALATION_SOLUTION | RESPIRATORY_TRACT | Status: AC
  Filled 2014-03-31: qty 3

## 2014-03-31 MED ORDER — VANCOMYCIN HCL 10 G IV SOLR: 1250.0000 mg | INTRAVENOUS | Status: DC

## 2014-03-31 MED ORDER — MAGIC MOUTHWASH
5.0000 mL | Freq: Three times a day (TID) | ORAL | Status: DC
Start: 2014-03-31 — End: 2014-04-01
  Administered 2014-03-31 – 2014-04-01 (×3): 5 mL via ORAL
  Filled 2014-03-31 (×5): qty 5

## 2014-03-31 MED ORDER — SODIUM CHLORIDE 0.9 % IV SOLN: INTRAVENOUS | Status: DC

## 2014-03-31 NOTE — Progress Notes (Signed)
ANTIBIOTIC CONSULT NOTE - Follow Up  Pharmacy Consult for Vancomycin and Zosyn Indication: rule out sepsis  No Known Allergies  Patient Measurements: Height: 5\' 1"  (154.9 cm) Weight: 109 lb 2 oz (49.5 kg) IBW/kg (Calculated) : 47.8  Vital Signs: Temp: 97.6 F (36.4 C) (08/29 0629) Temp src: Oral (08/29 0629) BP: 130/51 mmHg (08/29 0629) Pulse Rate: 72 (08/29 0629) Intake/Output from previous day: 08/28 0701 - 08/29 0700 In: 2247.5 [P.O.:240; I.V.:1707.5; IV Piggyback:300] Out: -  Intake/Output from this shift: Total I/O In: 240 [P.O.:240] Out: -   Labs:  Recent Labs  03/28/14 2004 03/28/14 2008 03/29/14 0335 03/30/14 0440  WBC  --  12.1* 10.4 17.2*  HGB  --  10.4* 9.4* 8.8*  PLT  --  333 286 303  CREATININE 0.76  --  0.82 1.00   Estimated Creatinine Clearance: 36.7 ml/min (by C-G formula based on Cr of 1). No results found for this basename: VANCOTROUGH, VANCOPEAK, VANCORANDOM, GENTTROUGH, GENTPEAK, GENTRANDOM, TOBRATROUGH, TOBRAPEAK, TOBRARND, AMIKACINPEAK, AMIKACINTROU, AMIKACIN,  in the last 72 hours    Assessment: 73 yo female presents with shortness of breath x 3 days. Pharmacy consulted to dose vancomycin and zosyn for sepsis.  PMH includes colon cancer (hemicolectomy 01/2014), hyplipidemia, DM, HTN. Developed afib with RVR while in ED, started on diltiazem drip, awaiting plans for anticoag (develeped afib on last admission and deferred anticoag at that time.)  8/26 >> Vanc >> 8/26 >> Zosyn >>  Tmax: AF now WBC: elev now (steroids) Renal: SCr wnl, 37 ml/in (CG), 55 ml/min (normalized), note small weight  8/27 MRSA PCR: neg 8/26 blood x2: ngtd 8/26 urine: ng F  Today is day #4 vancomycin 1g q24h and Zosyn 3.375g IV q8h (4 hour infusion time) for HCAP.  Goal of Therapy:  Vancomycin trough level 15-20 mcg/ml Zosyn dose per renal function  Plan:  1.  Check vancomycin trough tonight and adjust dose as needed. 2.  Continue Zosyn 3.375g IV q8h (4  hour infusion time). 3.  F/u narrowing of abx.  Hershal Coria, PharmD, BCPS Pager: [phone] 03/31/2014 2:16 PM

## 2014-03-31 NOTE — Progress Notes (Signed)
Patient ID: Amanda [name], female   DOB: Jul 25, 1941, 73 y.o.   MRN: [id number]     Subjective:    SOB is improving. No palpitations overnight.   Objective:   Temp:  [97.6 F (36.4 C)-98 F (36.7 C)] 97.6 F (36.4 C) (08/29 0629) Pulse Rate:  [64-75] 72 (08/29 0629) Resp:  [16-20] 16 (08/29 0629) BP: (117-130)/(42-51) 130/51 mmHg (08/29 0629) SpO2:  [97 %-100 %] 100 % (08/29 0629) Last BM Date: 03/30/14  Filed Weights   03/28/14 1944 03/29/14 0000 03/29/14 1106  Weight: 100 lb (45.36 kg) 106 lb 0.7 oz (48.1 kg) 109 lb 2 oz (49.5 kg)    Intake/Output Summary (Last 24 hours) at 03/31/14 0753 Last data filed at 03/31/14 0546  Gross per 24 hour  Intake 2247.5 ml  Output      0 ml  Net 2247.5 ml    Telemetry: NSR, some sinus brady overnight  Exam:  General: NAD  Resp: clear anteriorally  Cardiac: RRR  XB:MWUXLKG soft, NT, ND  MSK: no LE edema  Neuro: no focal deficits  Psych: appropriate affect  Lab Results:  Basic Metabolic Panel:  Recent Labs Lab 03/28/14 2004 03/29/14 0335 03/30/14 0440  NA 133* 132* 135*  K 3.9 3.6* 4.8  CL 95* 97 104  CO2 24 21 22   GLUCOSE 307* 390* 307*  BUN 13 13 18   CREATININE 0.76 0.82 1.00  CALCIUM 9.6 8.9 9.2    Liver Function Tests:  Recent Labs Lab 03/28/14 2004  AST 13  ALT 7  ALKPHOS 78  BILITOT 0.3  PROT 7.5  ALBUMIN 3.1*    CBC:  Recent Labs Lab 03/28/14 2008 03/29/14 0335 03/30/14 0440  WBC 12.1* 10.4 17.2*  HGB 10.4* 9.4* 8.8*  HCT 31.1* 29.4* 26.7*  MCV 83.4 85.5 84.5  PLT 333 286 303    Cardiac Enzymes: No results found for this basename: CKTOTAL, CKMB, CKMBINDEX, TROPONINI,  in the last 168 hours  BNP:  Recent Labs  01/01/14 1100 03/28/14 2006  PROBNP 1711.0* 566.3*    Coagulation:  Recent Labs Lab 03/29/14 0335  INR 1.09    ECG:   Medications:   Scheduled Medications: . antiseptic oral rinse  7 mL Mouth Rinse BID  . aspirin EC  81 mg Oral Daily  . budesonide  (PULMICORT) nebulizer solution  0.25 mg Nebulization BID  . diltiazem  60 mg Oral Q12H  . enoxaparin (LOVENOX) injection  40 mg Subcutaneous Q24H  . ferrous sulfate  325 mg Oral Daily  . insulin aspart  0-15 Units Subcutaneous TID WC  . insulin aspart  0-5 Units Subcutaneous QHS  . levalbuterol  0.63 mg Nebulization BID  . methylPREDNISolone (SOLU-MEDROL) injection  20 mg Intravenous Q12H  . metoprolol tartrate  25 mg Oral BID  . pantoprazole  80 mg Oral Daily  . piperacillin-tazobactam (ZOSYN)  IV  3.375 g Intravenous Q8H  . sodium chloride  3 mL Intravenous Q12H  . vancomycin  1,000 mg Intravenous Q24H     Infusions: . sodium chloride 75 mL/hr at 03/31/14 0002  . sodium chloride 0.9 % 1,000 mL infusion Stopped (03/29/14 0559)     PRN Medications:  acetaminophen, acetaminophen, levalbuterol, ondansetron (ZOFRAN) IV, ondansetron     Assessment/Plan    73 yo female hx of afib, DM, COPD, hx of GI bleedind, HTN admitted with fever and afib with RVR  1. Afib - converted back to NSR after IV dilt - appears to be paroxysmal, prior episode in  setting of colectomy, this episode in setting of febrile illness. Does not appear to be any other symptomatic episodes per her report - CHADS2 score is 3, have not committed to long term anticoag at this time. Her afib episodes have been isolated in setting of systemic illness. Also hx of GI bleed - change dilt to long acting today - will need 30 day event monitor at time of discharge  2. Pneumonia - management per primary team       [name], M.D., F.A.C.C.

## 2014-03-31 NOTE — Progress Notes (Signed)
[facility] PROGRESS NOTE  Amanda [name] GQQ:[id number] DOB: [birth year] DOA: 03/28/2014 PCP: Tamsen Roers, MD  Assessment/Plan: Sepsis  -Secondary to healthcare associated pneumonia  -Blood cultures x2 sets--obtained in the emergency department; no growth up to date -will continue Empiric vancomycin and Zosyn pending culture data; will treat with current antibiotics X 1 more day -continue supportive care  Healthcare associated pneumonia  -continue supplemental oxygen  -IV antibiotics (vanc and zosyn) X 1 more day -Pulmonary hygiene, flutter valve and pulmicort  -Aerosolized albuterol and Atrovent   Atrial fibrillation with RVR  -01/17/2014 echocardiogram EF 50-55%. No WMA  -CHADSVASc= 3-4  -Patient had previous PAF during her admission in June, but the decision was made to hold off on anticoagulation at that time. -cardiology consulted for decision regarding use of anticoagulation therapy and plan is to assess with event monitor for 30 days after discharge before starting long term anticoagulation . (patient hx of GI and hemicolectomy)  -continue ASA for now  -TSH slightly low, but free T4 WNL. Will required repeat Thyroid studies as an outpatient -Diltiazem trasitioned to long acting 120mg  daily on 8/28; HR remains controlled ad sinus  Hypertension  -Hold amlodipine as the patient is on diltiazem -BP well controlled. -Continue metoprolol tartrate   Diabetes mellitus type 2  -Hemoglobin A1c--6.8 on 01/09/14  -will continue SSI (will increased to moderate sliding scale)  -Patient was previously diet controlled; might required oral agents at discharge (like amaryl)  Hypokalemia -will replete as needed and follow electrolytes trend  COPD with exacerbation -most likely exacerbated due to PNA -will change solumedrol to prednisone  -continue abx's and use pulmicort and albuterol/atrovent nebulizer treatment -breathing better and currently w/o wheezing   Code Status:  Full Family Communication: no family at bedside Disposition Plan: remains inpatient. PT to assess patient capacity for ADL's in anticipation to determine next venue at discharge   Consultants:  Cardiology curbside (due to episode of a. Fib with RVR and needs for anticoagulation)  Procedures:  See below for x-ray reports   Antibiotics:  Vancomycin  Zosyn   HPI/Subjective:  Feeling better, breathing continue improving; HR controlled and sinus. No CP. No fever.  Objective: Filed Vitals:   03/31/14 1446  BP: 118/42  Pulse: 65  Temp: 98.8 F (37.1 C)  Resp: 20    Intake/Output Summary (Last 24 hours) at 03/31/14 1703 Last data filed at 03/31/14 1447  Gross per 24 hour  Intake 2102.5 ml  Output      0 ml  Net 2102.5 ml   Filed Weights   03/28/14 1944 03/29/14 0000 03/29/14 1106  Weight: 45.36 kg (100 lb) 48.1 kg (106 lb 0.7 oz) 49.5 kg (109 lb 2 oz)    Exam:   General:  Feeling better, breathing continue improving; HR controlled and sinus. No CP. No fever.  Cardiovascular: regular rate, s1 and S2, no rubs or gallops  Respiratory: improve air movement, no wheezing; no frank crackles  Abdomen: soft, NT, ND, positive BS  Musculoskeletal: no edema, no cyanosis or clubbing   Data Reviewed: Basic Metabolic Panel:  Recent Labs Lab 03/28/14 2004 03/29/14 0335 03/30/14 0440  NA 133* 132* 135*  K 3.9 3.6* 4.8  CL 95* 97 104  CO2 24 21 22   GLUCOSE 307* 390* 307*  BUN 13 13 18   CREATININE 0.76 0.82 1.00  CALCIUM 9.6 8.9 9.2   Liver Function Tests:  Recent Labs Lab 03/28/14 2004  AST 13  ALT 7  ALKPHOS 78  BILITOT 0.3  PROT 7.5  ALBUMIN 3.1*   CBC:  Recent Labs Lab 03/28/14 2008 03/29/14 0335 03/30/14 0440  WBC 12.1* 10.4 17.2*  NEUTROABS 9.5*  --   --   HGB 10.4* 9.4* 8.8*  HCT 31.1* 29.4* 26.7*  MCV 83.4 85.5 84.5  PLT 333 286 303   BNP (last 3 results)  Recent Labs  01/01/14 1100 03/28/14 2006  PROBNP 1711.0* 566.3*    CBG:  Recent Labs Lab 03/30/14 1718 03/30/14 2250 03/31/14 0726 03/31/14 1159 03/31/14 1622  GLUCAP 167* 150* 246* 359* 126*    Recent Results (from the past 240 hour(s))  CULTURE, BLOOD (ROUTINE X 2)     Status: None   Collection Time    03/28/14  8:08 PM      Result Value Ref Range Status   Specimen Description BLOOD BLOOD RIGHT FOREARM   Final   Special Requests BOTTLES DRAWN AEROBIC AND ANAEROBIC 5CC   Final   Culture  Setup Time     Final   Value: 03/29/2014 01:35     Performed at [facility]   Culture     Final   Value:        BLOOD CULTURE RECEIVED NO GROWTH TO DATE CULTURE WILL BE HELD FOR 5 DAYS BEFORE ISSUING A FINAL NEGATIVE REPORT     Performed at [facility]   Report Status PENDING   Incomplete  CULTURE, BLOOD (ROUTINE X 2)     Status: None   Collection Time    03/28/14  8:09 PM      Result Value Ref Range Status   Specimen Description BLOOD LEFT HAND   Final   Special Requests BOTTLES DRAWN AEROBIC AND ANAEROBIC 6 CC   Final   Culture  Setup Time     Final   Value: 03/29/2014 01:35     Performed at [facility]   Culture     Final   Value:        BLOOD CULTURE RECEIVED NO GROWTH TO DATE CULTURE WILL BE HELD FOR 5 DAYS BEFORE ISSUING A FINAL NEGATIVE REPORT     Performed at [facility]   Report Status PENDING   Incomplete  URINE CULTURE     Status: None   Collection Time    03/28/14 10:29 PM      Result Value Ref Range Status   Specimen Description URINE, CLEAN CATCH   Final   Special Requests NONE   Final   Culture  Setup Time     Final   Value: 03/29/2014 01:35     Performed at [place]     Final   Value: NO GROWTH     Performed at [facility]   Culture     Final   Value: NO GROWTH     Performed at [facility]   Report Status 03/29/2014 FINAL   Final  MRSA PCR SCREENING     Status: None   Collection Time    03/29/14 12:08 AM      Result Value Ref Range Status    MRSA by PCR NEGATIVE  NEGATIVE Final   Comment:            The GeneXpert MRSA Assay (FDA     approved for NASAL specimens     only), is one component of a     comprehensive MRSA colonization     surveillance program. It is not  intended to diagnose MRSA     infection nor to guide or     monitor treatment for     MRSA infections.     Studies: No results found.  Scheduled Meds: . antiseptic oral rinse  7 mL Mouth Rinse BID  . aspirin EC  81 mg Oral Daily  . budesonide (PULMICORT) nebulizer solution  0.25 mg Nebulization BID  . diltiazem  120 mg Oral Daily  . enoxaparin (LOVENOX) injection  40 mg Subcutaneous Q24H  . ferrous sulfate  325 mg Oral Daily  . insulin aspart  0-15 Units Subcutaneous TID WC  . insulin aspart  0-5 Units Subcutaneous QHS  . levalbuterol  0.63 mg Nebulization BID  . magic mouthwash  5 mL Oral TID  . metoprolol tartrate  25 mg Oral BID  . pantoprazole  80 mg Oral Daily  . piperacillin-tazobactam (ZOSYN)  IV  3.375 g Intravenous Q8H  . [START ON 04/01/2014] predniSONE  40 mg Oral Q breakfast  . sodium chloride  3 mL Intravenous Q12H  . vancomycin  1,000 mg Intravenous Q24H   Continuous Infusions: . sodium chloride 10 mL/hr at 03/31/14 1515  . sodium chloride 0.9 % 1,000 mL infusion Stopped (03/29/14 0559)    Active Problems:   COPD (chronic obstructive pulmonary disease)   Hyperlipidemia   Right colon cancer (Stage I) s/p partial colectomy 01/12/14   Sepsis   HCAP (healthcare-associated pneumonia)   Atrial fibrillation with RVR   Type II or unspecified type diabetes mellitus without mention of complication, not stated as uncontrolled   Unspecified essential hypertension    Time spent: < 30 minutes    Barton Dubois  [facility] Pager [phone]. If 7PM-7AM, please contact night-coverage at [email or web address], password [facility] 03/31/2014, 5:03 PM  LOS: 3 days

## 2014-03-31 NOTE — Evaluation (Signed)
Physical Therapy Evaluation Patient Details Name: Amanda [name] MRN: [id number] DOB: [birth year] Today's Date: 03/31/2014   History of Present Illness  73 year old female with a history of colon cancer status post right hemicolectomy in June 2015, COPD, hyperlipidemia, diet-controlled diabetes, hypertension presented to ED with 3 days of shortness of breath and associated fevers and chills. Found to have pna.  Clinical Impression  Pt with some generalized weakness . On 2 L O2 in bed and at rest at 99%. We did one short walk and pt slightly weak and some audible SOB. Educated on pursed lip breathing to bring O2 back up. O2 down to 90% after short walk but with breathing exercises it recovered quickly. Did another 50 feet walk and performed the same exercises due to drop on 02 after walk. Educated on short bouts of exercise , then recovery and then short bout again to increase lung ability and profusion.     Follow Up Recommendations Home health PT (will see how she progresses her bc she may no need it. She said last time they came she had already progress enough and they did not stay. )    Equipment Recommendations  None recommended by PT    Recommendations for Other Services       Precautions / Restrictions        Mobility  Bed Mobility Overal bed mobility: Independent                Transfers Overall transfer level: Modified independent Equipment used: 1 person hand held assist                Ambulation/Gait Ambulation/Gait assistance: Min guard Ambulation Distance (Feet): 40 Feet (then 50 feet after seated rest and breathing exercises) Assistive device: 1 person hand held assist Gait Pattern/deviations: Step-through pattern     General Gait Details: little staggery, but no LOB, pt states she feels a little "weak" not normal  Stairs            Wheelchair Mobility    Modified Rankin (Stroke Patients Only)       Balance                                              Pertinent Vitals/Pain Pain Assessment: No/denies pain    Home Living Family/patient expects to be discharged to:: Private residence Living Arrangements: Spouse/significant other Available Help at Discharge: Family;Available 24 hours/day Type of Home: House Home Access: Stairs to enter Entrance Stairs-Rails: Left Entrance Stairs-Number of Steps: 3 Home Layout: One level Home Equipment: Clinical cytogeneticist - 2 wheels;Bedside commode Additional Comments: Pt has walk in shower and tub shower.  Walk in has built in seat.    Prior Function Level of Independence: Independent         Comments: and drives     Hand Dominance   Dominant Hand: Right    Extremity/Trunk Assessment               Lower Extremity Assessment: Overall WFL for tasks assessed         Communication   Communication: No difficulties  Cognition Arousal/Alertness: Awake/alert Behavior During Therapy: WFL for tasks assessed/performed Overall Cognitive Status: Within Functional Limits for tasks assessed                      General Comments  Exercises        Assessment/Plan    PT Assessment Patient needs continued PT services  PT Diagnosis Generalized weakness   PT Problem List Decreased activity tolerance  PT Treatment Interventions Functional mobility training;Therapeutic activities;Therapeutic exercise   PT Goals (Current goals can be found in the Care Plan section) Acute Rehab PT Goals Patient Stated Goal: to get home whne ready and hope to get stronger.  PT Goal Formulation: With patient Time For Goal Achievement: 04/14/14 Potential to Achieve Goals: Good    Frequency Min 3X/week   Barriers to discharge        Co-evaluation               End of Session Equipment Utilized During Treatment: Gait belt Activity Tolerance: Patient tolerated treatment well Patient left: in bed;with bed alarm set Nurse Communication: Mobility  status         Time: 1040-1106 PT Time Calculation (min): 26 min   Charges:   PT Evaluation $Initial PT Evaluation Tier I: 1 Procedure PT Treatments $Gait Training: 8-22 mins $Therapeutic Activity: 8-22 mins   PT G CodesClide Dales 03/31/2014, 11:14 AM Clide Dales, PT Pager: [phone] 03/31/2014

## 2014-03-31 NOTE — Progress Notes (Signed)
PHARMACY - VANCOMYCIN (brief note)  Day #4 Vancomycin & Zosyn for HCAP  Vancomycin trough = 12.6 mcg/ml (goal 15-20 mcg/ml)  Assessment:  Vanc trough SUBtherapeutic  Plan:  Change Vancomycin to 1250mg  IV q24h           Continue Zosyn            Follow cultures & sensitivities            Recheck Vancomycin trough level when appropriate  Leone Haven, PharmD 03/31/14 @ 23:03

## 2014-04-01 DIAGNOSIS — K2100 Gastro-esophageal reflux disease with esophagitis, without bleeding: Secondary | ICD-10-CM

## 2014-04-01 LAB — CBC
HCT: 30.5 % — ABNORMAL LOW (ref 36.0–46.0)
Hemoglobin: 10.2 g/dL — ABNORMAL LOW (ref 12.0–15.0)
MCH: 28 pg (ref 26.0–34.0)
MCHC: 33.4 g/dL (ref 30.0–36.0)
MCV: 83.8 fL (ref 78.0–100.0)
PLATELETS: 415 10*3/uL — AB (ref 150–400)
RBC: 3.64 MIL/uL — AB (ref 3.87–5.11)
RDW: 15.3 % (ref 11.5–15.5)
WBC: 12.9 10*3/uL — AB (ref 4.0–10.5)

## 2014-04-01 LAB — GLUCOSE, CAPILLARY
GLUCOSE-CAPILLARY: 187 mg/dL — AB (ref 70–99)
Glucose-Capillary: 301 mg/dL — ABNORMAL HIGH (ref 70–99)

## 2014-04-01 MED ORDER — LEVALBUTEROL HCL 0.63 MG/3ML IN NEBU: 0.6300 mg | INHALATION_SOLUTION | Freq: Four times a day (QID) | RESPIRATORY_TRACT | Status: DC | PRN

## 2014-04-01 MED ORDER — DILTIAZEM HCL ER COATED BEADS 180 MG PO CP24
180.0000 mg | ORAL_CAPSULE | Freq: Every day | ORAL | Status: DC
  Administered 2014-04-01: 180 mg via ORAL
  Filled 2014-04-01: qty 1

## 2014-04-01 MED ORDER — ASPIRIN 81 MG PO TBEC: 81.0000 mg | DELAYED_RELEASE_TABLET | Freq: Every day | ORAL | Status: DC

## 2014-04-01 MED ORDER — BUDESONIDE 0.25 MG/2ML IN SUSP: 0.2500 mg | Freq: Two times a day (BID) | RESPIRATORY_TRACT | Status: DC

## 2014-04-01 MED ORDER — SODIUM CHLORIDE 0.9 % IV SOLN: INTRAVENOUS | Status: DC

## 2014-04-01 MED ORDER — GLIMEPIRIDE 1 MG PO TABS: 1.0000 mg | ORAL_TABLET | Freq: Every day | ORAL | Status: DC

## 2014-04-01 MED ORDER — GLIMEPIRIDE 1 MG PO TABS
1.0000 mg | ORAL_TABLET | Freq: Every day | ORAL | Status: DC
  Filled 2014-04-01: qty 1

## 2014-04-01 MED ORDER — DILTIAZEM HCL ER COATED BEADS 120 MG PO CP24: 120.0000 mg | ORAL_CAPSULE | Freq: Every day | ORAL | Status: DC

## 2014-04-01 MED ORDER — AMOXICILLIN-POT CLAVULANATE 875-125 MG PO TABS: 1.0000 | ORAL_TABLET | Freq: Two times a day (BID) | ORAL | Status: DC

## 2014-04-01 MED ORDER — AMOXICILLIN-POT CLAVULANATE 875-125 MG PO TABS
1.0000 | ORAL_TABLET | Freq: Two times a day (BID) | ORAL | Status: DC
  Administered 2014-04-01: 1 via ORAL
  Filled 2014-04-01 (×2): qty 1

## 2014-04-01 MED ORDER — PREDNISONE 20 MG PO TABS: ORAL_TABLET | ORAL | Status: DC

## 2014-04-01 NOTE — Plan of Care (Signed)
Problem: Phase II Progression Outcomes Goal: Wean O2 if indicated Outcome: Not Met (add Reason) Dc with home 02

## 2014-04-01 NOTE — Plan of Care (Signed)
Problem: Phase II Progression Outcomes Goal: Wean O2 if indicated Outcome: Not Met (add Reason) Home 02

## 2014-04-01 NOTE — Progress Notes (Signed)
CARE MANAGEMENT NOTE 04/01/2014  Patient:  Amanda [name], Amanda [name]   Account Number:  [id number]  Date Initiated:  03/29/2014  Documentation initiated by:  DAVIS,RHONDA  Subjective/Objective Assessment:   pt with fever of 1203.0 and cough, presented with increased wob and dyspnea.  Was found to be in A.Fib with RVR admitted and placed on Iv Cardizem     Action/Plan:   patient is from home and has a good family support/recent hx of colectomy 0615/hx of DM.   Anticipated DC Date:  03/30/2014   Anticipated DC Plan:  HOME/SELF CARE  In-house referral  NA      DC Planning Services  CM consult      PAC Choice  NA   Choice offered to / List presented to:  NA   DME arranged  OXYGEN      DME agency  [place].        Status of service:  Completed, signed off Medicare Important Message given?  YES (If response is "NO", the following Medicare IM given date [place] will be blank) Date Medicare IM given:  04/01/2014 Medicare IM given by:  [facility] Date Additional Medicare IM given:  03/30/2014 Additional Medicare IM given by:  Gabriel Earing  Discharge Disposition:  HOME/SELF CARE  Per UR Regulation:  Reviewed for med. necessity/level of care/duration of stay  If discussed at [place] of Stay Meetings, dates discussed:    Comments:   04/01/2014 1230 NCM spoke to pt. [place] notified for oxygen for home. Jonnie Finner RN CCM Case Mgmt phone [phone]  Verta Ellen: [phone] being transferred to to the telemetry floor/iv cardizem transitioned to p.o./main dx at this time is now pna and sepsis.

## 2014-04-01 NOTE — Progress Notes (Signed)
Patient ID: Amanda [name], female   DOB: 12-24-1940, 73 y.o.   MRN: [id number]    Subjective:   No palpitations   Objective:   Temp:  [97.7 F (36.5 C)-98.8 F (37.1 C)] 98 F (36.7 C) (08/30 0454) Pulse Rate:  [55-65] 55 (08/30 0454) Resp:  [18-20] 18 (08/30 0454) BP: (118-137)/(42-52) 118/52 mmHg (08/30 0454) SpO2:  [97 %-100 %] 100 % (08/30 0454) Last BM Date: 03/31/14  Filed Weights   03/28/14 1944 03/29/14 0000 03/29/14 1106  Weight: 100 lb (45.36 kg) 106 lb 0.7 oz (48.1 kg) 109 lb 2 oz (49.5 kg)    Intake/Output Summary (Last 24 hours) at 04/01/14 0731 Last data filed at 04/01/14 0600  Gross per 24 hour  Intake 1412.5 ml  Output      0 ml  Net 1412.5 ml    Telemetry: sinus rhythm, sinus brady early AM hours. Some PACs and short runs of atach at times. No afib  Exam:  General: NAD  Resp: clear anteriorally  Cardiac: RRR, no m/r/g, no JVD  QB:VQXIHWT soft, NT, ND  MSK: no LE edema  Neuro: no focal deficits  Psych: appropriate affect  Lab Results:  Basic Metabolic Panel:  Recent Labs Lab 03/28/14 2004 03/29/14 0335 03/30/14 0440  NA 133* 132* 135*  K 3.9 3.6* 4.8  CL 95* 97 104  CO2 24 21 22   GLUCOSE 307* 390* 307*  BUN 13 13 18   CREATININE 0.76 0.82 1.00  CALCIUM 9.6 8.9 9.2    Liver Function Tests:  Recent Labs Lab 03/28/14 2004  AST 13  ALT 7  ALKPHOS 78  BILITOT 0.3  PROT 7.5  ALBUMIN 3.1*    CBC:  Recent Labs Lab 03/29/14 0335 03/30/14 0440 04/01/14 0504  WBC 10.4 17.2* 12.9*  HGB 9.4* 8.8* 10.2*  HCT 29.4* 26.7* 30.5*  MCV 85.5 84.5 83.8  PLT 286 303 415*    Cardiac Enzymes: No results found for this basename: CKTOTAL, CKMB, CKMBINDEX, TROPONINI,  in the last 168 hours  BNP:  Recent Labs  01/01/14 1100 03/28/14 2006  PROBNP 1711.0* 566.3*    Coagulation:  Recent Labs Lab 03/29/14 0335  INR 1.09    ECG:   Medications:   Scheduled Medications: . antiseptic oral rinse  7 mL Mouth Rinse BID  .  aspirin EC  81 mg Oral Daily  . budesonide (PULMICORT) nebulizer solution  0.25 mg Nebulization BID  . diltiazem  120 mg Oral Daily  . enoxaparin (LOVENOX) injection  40 mg Subcutaneous Q24H  . ferrous sulfate  325 mg Oral Daily  . insulin aspart  0-15 Units Subcutaneous TID WC  . insulin aspart  0-5 Units Subcutaneous QHS  . levalbuterol  0.63 mg Nebulization BID  . magic mouthwash  5 mL Oral TID  . metoprolol tartrate  25 mg Oral BID  . pantoprazole  80 mg Oral Daily  . piperacillin-tazobactam (ZOSYN)  IV  3.375 g Intravenous Q8H  . predniSONE  40 mg Oral Q breakfast  . sodium chloride  3 mL Intravenous Q12H  . vancomycin  1,250 mg Intravenous Q24H     Infusions: . sodium chloride 10 mL/hr at 03/31/14 1515  . sodium chloride 0.9 % 1,000 mL infusion Stopped (03/29/14 0559)     PRN Medications:  acetaminophen, acetaminophen, levalbuterol, ondansetron (ZOFRAN) IV, ondansetron     Assessment/Plan    1. Afib  - converted back to NSR after IV dilt  - appears to be paroxysmal, prior episode in  setting of colectomy, this episode in setting of febrile illness. Does not appear to be any other symptomatic episodes per her report  - TSH 0.13, normal free T4 at 1.15.  - CHADS2 score is 3, have not committed to long term anticoag at this time. Her afib episodes have been isolated in setting of systemic illness. Also hx of GI bleed  - change dilt to long acting yesterday, will increase to 180mg  and stop her metoprolol to simplify her regimen.  - will need 30 day event monitor at time of discharge.    2. Pneumonia  - management per primary team   No current active cardiac conditions. May contact us at time of discharge to arrange 30 day monitor and appropriate follow up. Will sign off of inpatient care.         Amanda [name], M.D., F.A.C.C.

## 2014-04-01 NOTE — Progress Notes (Signed)
Contacted [facility] for oxygen for scheduled dc home today. Jonnie Finner RN CCM Case Mgmt phone [phone]

## 2014-04-01 NOTE — Discharge Summary (Signed)
Physician Discharge Summary  Amanda [name] JOI:[id number] DOB: 07-Jun-1941 DOA: 03/28/2014  PCP: Tamsen Roers, MD  Admit date: 03/28/2014 Discharge date: 04/01/2014  Time spent: >30 minutes  Recommendations for Outpatient Follow-up:  1. BMET to follow electrolytes and renal function 2. CBC to follow Hgb and WBC's trend 3. Close follow up of CBG's and blood sugar (started on low dose amaryl) 4. Will need follow up with pulmonologist and cardiologist at discharge 5. Reassess oxygen needs 6. Repeat CXR in 4 weeks to follow infiltrates and PNA resolution  Discharge Diagnoses:  Active Problems:   COPD (chronic obstructive pulmonary disease)   Hyperlipidemia   Right colon cancer (Stage I) s/p partial colectomy 01/12/14   Sepsis   HCAP (healthcare-associated pneumonia)   Atrial fibrillation with RVR   Type II or unspecified type diabetes mellitus without mention of complication, not stated as uncontrolled   Unspecified essential hypertension   Discharge Condition: stable and improved. Discharge home with oxygen supplementation (O2 sat RA ambulation was 86%); she will finish therapy/treatment as instructed in outpatient setting and will follow with PCP and pulmonologist.  Diet recommendation: low sodium diet   Filed Weights   03/28/14 1944 03/29/14 0000 03/29/14 1106  Weight: 45.36 kg (100 lb) 48.1 kg (106 lb 0.7 oz) 49.5 kg (109 lb 2 oz)    History of present illness:  73 year old female with a history of colon cancer status post right hemicolectomy in June 2015, COPD, hyperlipidemia, diet-controlled diabetes, hypertension presents with 3 days of shortness of breath and associated fevers and chills. The patient states that she developed a fever up to 102.91F on the day of admission. She's been complaining of a nonproductive cough without hemoptysis. She denies any chest discomfort, nausea, vomiting, diarrhea, abdominal pain, dysuria, hematuria. She complains of headache but denies any  blurred vision or focal extremity weakness. Her appetite has been good.    Hospital Course:  Sepsis  -Secondary to healthcare associated pneumonia  -Blood cultures x2 sets--obtained in the emergency department; no growth up to date  -sepsis indicators resolved; WBC's practically back to norma; no fever and good BP. -continue antibiotic therapy as mentioned below for PNA treatment.  Healthcare associated pneumonia  -continue supplemental oxygen  -IV antibiotics (vanc and zosyn) X 5 days; will change to augmentin and finish antibiotics therapy -Pulmonary hygiene, flutter valve and pulmicort  -Aerosolized albuterol and Atrovent   Atrial fibrillation with RVR  -01/17/2014 echocardiogram EF 50-55%. No WMA  -CHADSVASc= 3-4  -Patient had previous PAF during her admission in June, but the decision was made to hold off on anticoagulation at that time.  -cardiology consulted for decision regarding use of anticoagulation therapy and plan is to assess with event monitor for 30 days after discharge before starting long term anticoagulation . (patient hx of GI and hemicolectomy)  -continue ASA for now  -TSH slightly low, but free T4 WNL. Will required repeat Thyroid studies as an outpatient  -Diltiazem trasitioned to long acting 120mg  daily on 8/28; HR remains controlled and in sinus rhythm  Hypertension  -Hold amlodipine as the patient is on diltiazem now -Continue metoprolol tartrate  -BP well controlled.   Diabetes mellitus type 2  -Hemoglobin A1c--7.8 during this admission and elevated CBG's due to steroids  -will discharge on amaryl and PCP to follow and further adjust hypoglycemic regimen as needed  Hypokalemia  -repleted  COPD with exacerbation  -most likely exacerbated due to PNA  -will change solumedrol to prednisone and tapered over 8-9 days. -continue abx's and  use pulmicort and xopenxex PRN nebulizer treatment  -breathing a lot better and currently w/o wheezing at  discharge -due to O2 desat on exertion oxygen has been arranged for home use   Procedures: See below for x-ray reports   Consultations:  Cardiology   Discharge Exam: Filed Vitals:   04/01/14 0454  BP: 118/52  Pulse: 55  Temp: 98 F (36.7 C)  Resp: 18    General: Feeling better, breathing a lot better and continue improving; HR controlled and sinus. No CP. No fever.  Cardiovascular: regular rate, s1 and S2, no rubs or gallops  Respiratory: improve air movement, no wheezing; no frank crackles  Abdomen: soft, NT, ND, positive BS  Musculoskeletal: no edema, no cyanosis or clubbing    Discharge Instructions You were cared for by a hospitalist during your hospital stay. If you have any questions about your discharge medications or the care you received while you were in the hospital after you are discharged, you can call the unit and asked to speak with the hospitalist on call if the hospitalist that took care of you is not available. Once you are discharged, your primary care physician will handle any further medical issues. Please note that NO REFILLS for any discharge medications will be authorized once you are discharged, as it is imperative that you return to your primary care physician (or establish a relationship with a primary care physician if you do not have one) for your aftercare needs so that they can reassess your need for medications and monitor your lab values.  Discharge Instructions   Discharge instructions    Complete by:  As directed   Take medications as prescribed Arrange follow up with PCP in 1 week Arrange follow up with pulmonologist in 10 days or so Use oxygen 24/7 (especially during exertion); PCP and/or pulmonologist to assess further needs and length of oxygen therapy Follow up with cardiology as instructed (for event monitoring and further decisions regarding anticoagulation) Follow a low sodium diet-heart healthy diet            Medication List     STOP taking these medications       amLODipine 5 MG tablet  Commonly known as:  NORVASC     Fluticasone-Salmeterol 250-50 MCG/DOSE Aepb  Commonly known as:  ADVAIR      TAKE these medications       amoxicillin-clavulanate 875-125 MG per tablet  Commonly known as:  AUGMENTIN  Take 1 tablet by mouth every 12 (twelve) hours.     aspirin 81 MG EC tablet  Take 1 tablet (81 mg total) by mouth daily.     budesonide 0.25 MG/2ML nebulizer solution  Commonly known as:  PULMICORT  Take 2 mLs (0.25 mg total) by nebulization 2 (two) times daily.     diltiazem 120 MG 24 hr capsule  Commonly known as:  CARDIZEM CD  Take 1 capsule (120 mg total) by mouth daily.     ferrous sulfate 325 (65 FE) MG tablet  Take 1 tablet (325 mg total) by mouth daily.     glimepiride 1 MG tablet  Commonly known as:  AMARYL  Take 1 tablet (1 mg total) by mouth daily with breakfast.  Start taking on:  04/02/2014     levalbuterol 0.63 MG/3ML nebulizer solution  Commonly known as:  XOPENEX  Take 3 mLs (0.63 mg total) by nebulization every 6 (six) hours as needed for wheezing or shortness of breath.     metoprolol tartrate  25 MG tablet  Commonly known as:  LOPRESSOR  Take 1 tablet (25 mg total) by mouth 2 (two) times daily.     omeprazole 40 MG capsule  Commonly known as:  PRILOSEC  Take 40 mg by mouth daily.     predniSONE 20 MG tablet  Commonly known as:  DELTASONE  Take 2 tables by mouth daily X 3 days; then 1 tablet by mouth daily X 3 days; then 1/2 tablet by mouth daily X 3 days and stop prednisone       No Known Allergies     Follow-up Information   Follow up with Sinclair Grooms, MD. (call office to arrange event monitoring test)    Specialty:  Cardiology   Contact information:   [address] [address] [phone]       Follow up with Tamsen Roers, MD In 1 week.   Specialty:  Family Medicine   Contact information:   [address] [place]  [address] [phone]       Follow up with Simonne Maffucci, MD In 10 days.   Specialty:  Pulmonary Disease   Contact information:   [place] [address] [phone]        The results of significant diagnostics from this hospitalization (including imaging, microbiology, ancillary and laboratory) are listed below for reference.    Significant Diagnostic Studies: Dg Chest Port 1 View  03/28/2014   CLINICAL DATA:  Shortness of breath and COPD.  EXAM: PORTABLE CHEST - 1 VIEW  COMPARISON:  01/14/2014.  FINDINGS: 2126 hrs. Interval development of right upper lobe airspace disease, compatible with pneumonia. Left lung is clear. No evidence for pleural effusion. The cardiopericardial silhouette is within normal limits for size. Telemetry leads overlie the chest. Patient is status post right shoulder replacement.  IMPRESSION: Airspace disease in the right upper lobe compatible with pneumonia. Followup imaging is recommended to ensure resolution.   Electronically Signed   By: Misty Stanley M.D.   On: 03/28/2014 21:35    Microbiology: Recent Results (from the past 240 hour(s))  CULTURE, BLOOD (ROUTINE X 2)     Status: None   Collection Time    03/28/14  8:08 PM      Result Value Ref Range Status   Specimen Description BLOOD BLOOD RIGHT FOREARM   Final   Special Requests BOTTLES DRAWN AEROBIC AND ANAEROBIC 5CC   Final   Culture  Setup Time     Final   Value: 03/29/2014 01:35     Performed at [facility]   Culture     Final   Value:        BLOOD CULTURE RECEIVED NO GROWTH TO DATE CULTURE WILL BE HELD FOR 5 DAYS BEFORE ISSUING A FINAL NEGATIVE REPORT     Performed at [facility]   Report Status PENDING   Incomplete  CULTURE, BLOOD (ROUTINE X 2)     Status: None   Collection Time    03/28/14  8:09 PM      Result Value Ref Range Status   Specimen Description BLOOD LEFT HAND   Final   Special Requests BOTTLES DRAWN AEROBIC AND ANAEROBIC 6 CC   Final   Culture   Setup Time     Final   Value: 03/29/2014 01:35     Performed at [facility]   Culture     Final   Value:  BLOOD CULTURE RECEIVED NO GROWTH TO DATE CULTURE WILL BE HELD FOR 5 DAYS BEFORE ISSUING A FINAL NEGATIVE REPORT     Performed at [facility]   Report Status PENDING   Incomplete  URINE CULTURE     Status: None   Collection Time    03/28/14 10:29 PM      Result Value Ref Range Status   Specimen Description URINE, CLEAN CATCH   Final   Special Requests NONE   Final   Culture  Setup Time     Final   Value: 03/29/2014 01:35     Performed at [place]     Final   Value: NO GROWTH     Performed at [facility]   Culture     Final   Value: NO GROWTH     Performed at [facility]   Report Status 03/29/2014 FINAL   Final  MRSA PCR SCREENING     Status: None   Collection Time    03/29/14 12:08 AM      Result Value Ref Range Status   MRSA by PCR NEGATIVE  NEGATIVE Final   Comment:            The GeneXpert MRSA Assay (FDA     approved for NASAL specimens     only), is one component of a     comprehensive MRSA colonization     surveillance program. It is not     intended to diagnose MRSA     infection nor to guide or     monitor treatment for     MRSA infections.     Labs: Basic Metabolic Panel:  Recent Labs Lab 03/28/14 2004 03/29/14 0335 03/30/14 0440  NA 133* 132* 135*  K 3.9 3.6* 4.8  CL 95* 97 104  CO2 24 21 22   GLUCOSE 307* 390* 307*  BUN 13 13 18   CREATININE 0.76 0.82 1.00  CALCIUM 9.6 8.9 9.2   Liver Function Tests:  Recent Labs Lab 03/28/14 2004  AST 13  ALT 7  ALKPHOS 78  BILITOT 0.3  PROT 7.5  ALBUMIN 3.1*   CBC:  Recent Labs Lab 03/28/14 2008 03/29/14 0335 03/30/14 0440 04/01/14 0504  WBC 12.1* 10.4 17.2* 12.9*  NEUTROABS 9.5*  --   --   --   HGB 10.4* 9.4* 8.8* 10.2*  HCT 31.1* 29.4* 26.7* 30.5*  MCV 83.4 85.5 84.5 83.8  PLT 333 286 303 415*   BNP: BNP (last 3  results)  Recent Labs  01/01/14 1100 03/28/14 2006  PROBNP 1711.0* 566.3*   CBG:  Recent Labs Lab 03/31/14 0726 03/31/14 1159 03/31/14 1622 03/31/14 2134 04/01/14 0729  GLUCAP 246* 359* 126* 229* 187*    Signed:  Barton Dubois  [facility] 04/01/2014, 11:34 AM

## 2014-04-01 NOTE — Progress Notes (Signed)
SATURATION QUALIFICATIONS: (This note is used to comply with regulatory documentation for home oxygen)  Patient Saturations on Room Air at Rest = 98%  Patient Saturations on Room Air while Ambulating = 86%  Patient Saturations on 2 Liters of oxygen while Ambulating = 94%  Please briefly explain why patient needs home oxygen: Pt very activity intolerant with less than 30 ft of ambulation

## 2014-04-02 ENCOUNTER — Telehealth: Payer: Self-pay | Admitting: Interventional Cardiology

## 2014-04-02 DIAGNOSIS — I4891 Unspecified atrial fibrillation: Secondary | ICD-10-CM

## 2014-04-02 NOTE — Telephone Encounter (Signed)
New problem   Pt's spouse is calling because he stated pt was suppose to have gotten a heart monitor when she was discharged from hospital but didn't. Dr Tamala Julian done consult in hospital and Dr Harl Bowie spoke with pt about monitor. Pt's spouse stated pt need to have monitor today. Please advise pt's spouse.

## 2014-04-02 NOTE — Telephone Encounter (Signed)
Pt's family member called he states that pt needed to be discharged home from the hospital with a heart monitor. According the cardiology consult with Dr. Angelena Form pt needed to F/U with Dr. Tamala Julian in [address] office in 3 to 4 weeks post D/C she will need to have a 30 days event monitor we can help arrange at the time of her discharge. Pt is aware of Dr Camillia Herter recommendations. Pt is aware that we will call her back to scheduled the heart monitor placement and for Dr. Thompson Caul appointment.

## 2014-04-02 NOTE — Telephone Encounter (Signed)
Pt is aware of appointment for 30 days event monitor placement for tomorrow in this office, and appointment with Dr. Tamala Julian on 05/07/14.

## 2014-04-03 ENCOUNTER — Encounter (INDEPENDENT_AMBULATORY_CARE_PROVIDER_SITE_OTHER): Payer: Medicare HMO

## 2014-04-03 ENCOUNTER — Encounter: Payer: Self-pay | Admitting: *Deleted

## 2014-04-03 DIAGNOSIS — I4891 Unspecified atrial fibrillation: Secondary | ICD-10-CM

## 2014-04-03 NOTE — Progress Notes (Signed)
Patient ID: Amanda [name], female   DOB: [birth year], 73 y.o.   MRN: [id number] [employer] 30 day cardiac event monitor applied to patient.

## 2014-04-04 ENCOUNTER — Encounter: Payer: Self-pay | Admitting: Pulmonary Disease

## 2014-04-04 ENCOUNTER — Ambulatory Visit (INDEPENDENT_AMBULATORY_CARE_PROVIDER_SITE_OTHER): Payer: Medicare HMO | Admitting: Pulmonary Disease

## 2014-04-04 VITALS — BP 136/68 | HR 67 | Ht 61.0 in | Wt 109.0 lb

## 2014-04-04 DIAGNOSIS — J9610 Chronic respiratory failure, unspecified whether with hypoxia or hypercapnia: Secondary | ICD-10-CM

## 2014-04-04 DIAGNOSIS — J9611 Chronic respiratory failure with hypoxia: Secondary | ICD-10-CM

## 2014-04-04 DIAGNOSIS — J441 Chronic obstructive pulmonary disease with (acute) exacerbation: Secondary | ICD-10-CM

## 2014-04-04 DIAGNOSIS — B370 Candidal stomatitis: Secondary | ICD-10-CM | POA: Insufficient documentation

## 2014-04-04 DIAGNOSIS — Z23 Encounter for immunization: Secondary | ICD-10-CM

## 2014-04-04 DIAGNOSIS — J189 Pneumonia, unspecified organism: Secondary | ICD-10-CM

## 2014-04-04 DIAGNOSIS — R0902 Hypoxemia: Secondary | ICD-10-CM

## 2014-04-04 LAB — CULTURE, BLOOD (ROUTINE X 2): CULTURE: NO GROWTH

## 2014-04-04 MED ORDER — FLUTICASONE-SALMETEROL 250-50 MCG/DOSE IN AEPB: 1.0000 | INHALATION_SPRAY | Freq: Two times a day (BID) | RESPIRATORY_TRACT | Status: DC

## 2014-04-04 MED ORDER — LEVALBUTEROL TARTRATE 45 MCG/ACT IN AERO: 2.0000 | INHALATION_SPRAY | Freq: Four times a day (QID) | RESPIRATORY_TRACT | Status: DC | PRN

## 2014-04-04 MED ORDER — MAGIC MOUTHWASH W/LIDOCAINE: 5.0000 mL | Freq: Three times a day (TID) | ORAL | Status: DC | PRN

## 2014-04-04 NOTE — Progress Notes (Signed)
Subjective:    Patient ID: Amanda [name], female    DOB: [birth year], 73 y.o.   MRN: [id number]  Synopsis> likely COPD, first seen by [facility] 2015. Also diagnosed with stage I colon cancer requiring a partial colectomy in 2015.  HPI  04/04/2014 ROV > Amanda [name] was discharged from the hospital on 8/30 after she was treatd for HCAP.  She never had a fever but she was short of breath.  She feels OK now.  She is wearing a heart monitor a heart monitor.  No cough or mucus or fever.  She was discharged on pulmicort but she isn't sure if she needs.  She had a-fib in the hospital which is now improved.  She has not had fever or chills at home.  No leg swelling.  She has the Advair and is using it at home.  She wants to know if she still needs the oxygen.    Past Medical History  Diagnosis Date  . Emphysema lung   . Hyperlipidemia   . GERD (gastroesophageal reflux disease)   . Vitamin D deficiency   . Closed fracture of unspecified part of upper end of humerus   . Complete rupture of rotator cuff     right shoulder- limited range of motion  . Other vitamin B12 deficiency anemia   . Neurogenic bladder, NOS     02-22-14 some bladder issues of urgency is somewhat improve.  Marland Kitchen COPD (chronic obstructive pulmonary disease)   . Type II or unspecified type diabetes mellitus without mention of complication, uncontrolled     diet controlled  . Essential hypertension, benign     off lisinporil for last 2 months  . Esophageal stricture 12/28/2013  . Cancer     colon cancer 01-12-14- no further tx. intended  . Arrhythmia     7-23-15x1beginning of colon surgery 01-12-14 - no problems since.     Review of Systems  Constitutional: Positive for fatigue. Negative for fever and chills.  HENT: Negative for postnasal drip, rhinorrhea and sinus pressure.   Respiratory: Negative for cough, shortness of breath and wheezing.   Cardiovascular: Negative for chest pain, palpitations and leg swelling.      Objective:   Physical Exam  Filed Vitals:   04/04/14 1604  BP: 136/68  Pulse: 67  Height: 5\' 1"  (1.549 m)  Weight: 109 lb (49.442 kg)  SpO2: 99%  2L [place]  On RA at rest O2 saturation 97%, dropped to 83% on RA, improved on 2L   Gen: well appearing, no acute distress HEENT: NCAT, OP Clear,  PULM: Good air movement, minimal wheeze CV: Irreg irreg, slight systolic murmur, no JVD AB: BS+, soft, nontender, no hsm Ext: warm, no edema, no clubbing, no cyanosis      Assessment & Plan:   COPD exacerbation She is recovering well from this well but she still needs oxygen with exertion.  She had a lot of questions about her medications.  She does not need to take the Pulmicort as that is redundant.  Plan: -Continue prednisone as prescribed by the hospitalist -Continue Augmentin -Discontinue Pulmicort -Use Xopenex as needed for shortness of breath, she has a prescription for a nebulizer to use at home as well as an inhaler to use when out in public -Use Advair twice a day  HCAP (healthcare-associated pneumonia) She is recovering well from this. She needs to have a followup chest x-ray in 6 weeks to ensure radiographic resolution.  Chronic hypoxemic respiratory failure Her oxygen  saturation at rest today was normal but on ambulation she dropped to 83%. We asked her to continue using 2 L with exertion and at night. We will prescribe a portable oxygen concentrator and we'll recheck her oxygen when she follows up in 3 months.  Thrush I reminded her of the importance of rinsing her mouth after using inhalers. Prescribed Magic mouthwash.    Updated Medication List Outpatient Encounter Prescriptions as of 04/04/2014  Medication Sig  . amLODipine (NORVASC) 5 MG tablet Take 5 mg by mouth daily.  Marland Kitchen amoxicillin-clavulanate (AUGMENTIN) 875-125 MG per tablet Take 1 tablet by mouth every 12 (twelve) hours.  Marland Kitchen aspirin EC 81 MG EC tablet Take 1 tablet (81 mg total) by mouth daily.  Marland Kitchen  diltiazem (CARDIZEM CD) 120 MG 24 hr capsule Take 1 capsule (120 mg total) by mouth daily.  . ferrous sulfate 325 (65 FE) MG tablet Take 1 tablet (325 mg total) by mouth daily.  Marland Kitchen glimepiride (AMARYL) 1 MG tablet Take 1 tablet (1 mg total) by mouth daily with breakfast.  . metoprolol tartrate (LOPRESSOR) 25 MG tablet Take 1 tablet (25 mg total) by mouth 2 (two) times daily.  Marland Kitchen omeprazole (PRILOSEC) 40 MG capsule Take 40 mg by mouth daily.  . predniSONE (DELTASONE) 20 MG tablet Take 2 tables by mouth daily X 3 days; then 1 tablet by mouth daily X 3 days; then 1/2 tablet by mouth daily X 3 days and stop prednisone  . budesonide (PULMICORT) 0.25 MG/2ML nebulizer solution Take 2 mLs (0.25 mg total) by nebulization 2 (two) times daily.  Marland Kitchen levalbuterol (XOPENEX) 0.63 MG/3ML nebulizer solution Take 3 mLs (0.63 mg total) by nebulization every 6 (six) hours as needed for wheezing or shortness of breath.

## 2014-04-04 NOTE — Assessment & Plan Note (Signed)
Her oxygen saturation at rest today was normal but on ambulation she dropped to 83%. We asked her to continue using 2 L with exertion and at night. We will prescribe a portable oxygen concentrator and we'll recheck her oxygen when she follows up in 3 months.

## 2014-04-04 NOTE — Assessment & Plan Note (Signed)
She is recovering well from this. She needs to have a followup chest x-ray in 6 weeks to ensure radiographic resolution.

## 2014-04-04 NOTE — Assessment & Plan Note (Signed)
I reminded her of the importance of rinsing her mouth after using inhalers. Prescribed Magic mouthwash.

## 2014-04-04 NOTE — Assessment & Plan Note (Signed)
She is recovering well from this well but she still needs oxygen with exertion.  She had a lot of questions about her medications.  She does not need to take the Pulmicort as that is redundant.  Plan: -Continue prednisone as prescribed by the hospitalist -Continue Augmentin -Discontinue Pulmicort -Use Xopenex as needed for shortness of breath, she has a prescription for a nebulizer to use at home as well as an inhaler to use when out in public -Use Advair twice a day

## 2014-04-04 NOTE — Patient Instructions (Addendum)
Take the Advair one puff twice a day no matter how you feel Use the Xopenex (either the nebulizer or 2 puffs on the inhaler) every 6 hours as needed for shortness of breath We will arrange a chest x-ray for October 12, come to our office for that Continue wearing oxygen at 2 liters with exertion.  We will re-address this at your next visit. We will see you back in 3 months or sooner if needed

## 2014-04-05 ENCOUNTER — Telehealth: Payer: Self-pay | Admitting: Pulmonary Disease

## 2014-04-05 LAB — CULTURE, BLOOD (ROUTINE X 2)

## 2014-04-05 NOTE — Telephone Encounter (Signed)
ATC call [facility] on [address] multiple times but kept getting a fast busy signal.  Need to clarify MMW rx with them.  PA forms filled out and placed for Dr  [place] Bells to sign.   Samples of Advair left at front for pt until PA completed.  So samples of Xopenox available.  Will hold in triage retry pharmacy

## 2014-04-05 NOTE — Telephone Encounter (Signed)
I spoke with pharmacy and advised that MMW should be without lidocaine. They will fill this for the pt. I will foward this message to Amanda [name] so she can follow-up on the PA forms once they are signed by BQ. [place] Bing, CMA

## 2014-04-05 NOTE — Telephone Encounter (Signed)
Barbaraann Rondo from [facility] called. Needs to talk to someone in regards to these meds. Pt is there now. Please call [phone]

## 2014-04-05 NOTE — Telephone Encounter (Signed)
Have not received any PA for pt, will look out for these.

## 2014-04-10 NOTE — Telephone Encounter (Signed)
Caryl Pina, please advise on status of PA. Thanks.

## 2014-04-10 NOTE — Telephone Encounter (Signed)
PA has been received, will fill out and fax accordingly.

## 2014-04-14 ENCOUNTER — Telehealth: Payer: Self-pay | Admitting: Physician Assistant

## 2014-04-14 NOTE — Telephone Encounter (Signed)
Patient has a monitor on and had an episode of atrial fibrillation, rapid ventricular response. They contacted her and at the time of the contact, the symptoms had resolved and she was in sinus rhythm. Strips are to be faxed to the office.

## 2014-04-17 NOTE — Telephone Encounter (Signed)
This has been signed and faxed

## 2014-04-17 NOTE — Telephone Encounter (Signed)
Any updates on PA? Thanks.  

## 2014-04-18 NOTE — Telephone Encounter (Signed)
Spoke with Curt Bears, Pharmacist with [facility].  Needing additional information for Xopenex HFA PA -- provided this information. Xopenex PA APPROVED through 08/02/2014.  Approval # [id number]. Asked about status of Advair PA - per Henri Medal did not need a PA; it is an approved medication on pt's formulary, but there is a quantity restriction of 1 inhaler per month.  Per Curt Bears, nothing further is needed on on the Advair.    [employer], spoke with Broadus John.  Advised of Xopenex HFA approval.  He verbalized understanding and also confirmed Advair is going through with no problem.   lmomtcb for pt's daughter to inform her of above.

## 2014-04-18 NOTE — Telephone Encounter (Signed)
Insurance company has question a/b medication [employer] call back [phone].Amanda [name]

## 2014-04-19 NOTE — Telephone Encounter (Signed)
lmomtcb x 2 for pts daughter lisa.

## 2014-04-20 NOTE — Telephone Encounter (Signed)
LMOM TCB x3 for pt's daughter Lattie Haw.

## 2014-04-23 NOTE — Telephone Encounter (Signed)
Pt advised. [place], CMA  

## 2014-04-23 NOTE — Telephone Encounter (Signed)
LMTCB for daughter and for pt on home #. [place] Bing, CMA

## 2014-05-07 ENCOUNTER — Ambulatory Visit (INDEPENDENT_AMBULATORY_CARE_PROVIDER_SITE_OTHER): Payer: Medicare HMO | Admitting: Interventional Cardiology

## 2014-05-07 ENCOUNTER — Encounter: Payer: Self-pay | Admitting: Interventional Cardiology

## 2014-05-07 VITALS — BP 139/76 | HR 59 | Ht 61.0 in | Wt 103.4 lb

## 2014-05-07 DIAGNOSIS — J441 Chronic obstructive pulmonary disease with (acute) exacerbation: Secondary | ICD-10-CM

## 2014-05-07 DIAGNOSIS — I10 Essential (primary) hypertension: Secondary | ICD-10-CM

## 2014-05-07 DIAGNOSIS — I4891 Unspecified atrial fibrillation: Secondary | ICD-10-CM

## 2014-05-07 DIAGNOSIS — E785 Hyperlipidemia, unspecified: Secondary | ICD-10-CM

## 2014-05-07 DIAGNOSIS — E118 Type 2 diabetes mellitus with unspecified complications: Secondary | ICD-10-CM

## 2014-05-07 MED ORDER — ASPIRIN EC 325 MG PO TBEC: 325.0000 mg | DELAYED_RELEASE_TABLET | Freq: Every day | ORAL | Status: DC

## 2014-05-07 NOTE — Progress Notes (Signed)
Patient ID: Amanda [name], female   DOB: [birth year], 73 y.o.   MRN: [id number]   Date: 05/07/2014 ID: Amanda [name], DOB 1941-05-12, MRN [id number] PCP: Tamsen Roers, MD  Reason: Paroxysmal atrial fibrillation  ASSESSMENT;  1. Paroxysmal atrial fibrillation during acute illness and also now documented on 30 day monitor as an outpatient when not acutely ill. One episode over 30 days lasting 1 hour 44 minutes. 2. COPD 3. Essential hypertension 4. Prior history of gastrointestinal blood loss with diagnosis of colonic cancer, status post resection June 2015.   PLAN:  1. CHADS2 score of 3 with an estimated annual stroke risk of 6% per year. Patient is currently on 81 mg of aspirin per day. 2. We discussed the embolic stroke risk with anticoagulation,  Aspirin therapy, and no therapy. Despite the excess risk of stroke with aspirin, the patient has decided against anticoagulation with Coumadin or NOACs. She is concerned about inconvenience and cost respectively. She also feels that she has an inherent tendency to bleed. We spent considerable time discussing the natural history of atrial fibrillation and stroke risk. She does seem to clearly understand the trade-off is between aspirin and anticoagulation and decided with confidence that she would stay with aspirin. I've asked her increase her dose to 325 mg per day.    SUBJECTIVE: Amanda [name] is a 73 y.o. female who is who was hospitalized in June and again in August. During each hospital stay she had paroxysmal atrial fibrillation in the setting of acute surgery or medical illness. Episodes of atrial fibrillation were short lived. It was felt that the episodes of atrial fibrillation could of been related to acute illness. A 30 day monitor was arranged at discharge from the hospital. She has not completed the monitor and did have one episode of atrial fibrillation lasting 1 hour 44 minutes over a 30 day period of surveillance. She was unaware. She has had  no neurological complaints. She denies cardiac complaints. She has chronic exertional dyspnea.   No Known Allergies  Current Outpatient Prescriptions on File Prior to Visit  Medication Sig Dispense Refill  . ferrous sulfate 325 (65 FE) MG tablet Take 1 tablet (325 mg total) by mouth daily.  30 tablet  0  . glimepiride (AMARYL) 1 MG tablet Take 1 tablet (1 mg total) by mouth daily with breakfast.  30 tablet  1  . levalbuterol (XOPENEX HFA) 45 MCG/ACT inhaler Inhale 2 puffs into the lungs every 6 (six) hours as needed for wheezing or shortness of breath.  1 Inhaler  12  . levalbuterol (XOPENEX) 0.63 MG/3ML nebulizer solution Take 3 mLs (0.63 mg total) by nebulization every 6 (six) hours as needed for wheezing or shortness of breath.  3 mL  2  . metoprolol tartrate (LOPRESSOR) 25 MG tablet Take 1 tablet (25 mg total) by mouth 2 (two) times daily.  60 tablet  1  . omeprazole (PRILOSEC) 40 MG capsule Take 40 mg by mouth daily.      . Alum & Mag Hydroxide-Simeth (MAGIC MOUTHWASH W/LIDOCAINE) SOLN Take 5 mLs by mouth 3 (three) times daily as needed for mouth pain.  120 mL  0  . amLODipine (NORVASC) 5 MG tablet Take 5 mg by mouth daily.      Marland Kitchen amoxicillin-clavulanate (AUGMENTIN) 875-125 MG per tablet Take 1 tablet by mouth every 12 (twelve) hours.  16 tablet  0  . diltiazem (CARDIZEM CD) 120 MG 24 hr capsule Take 1 capsule (120 mg total) by mouth daily.  30 capsule  1  . Fluticasone-Salmeterol (ADVAIR DISKUS) 250-50 MCG/DOSE AEPB Inhale 1 puff into the lungs 2 (two) times daily.  60 each  5  . predniSONE (DELTASONE) 20 MG tablet Take 2 tables by mouth daily X 3 days; then 1 tablet by mouth daily X 3 days; then 1/2 tablet by mouth daily X 3 days and stop prednisone  12 tablet  0   No current facility-administered medications on file prior to visit.    Past Medical History  Diagnosis Date  . Emphysema lung   . Hyperlipidemia   . GERD (gastroesophageal reflux disease)   . Vitamin D deficiency   .  Closed fracture of unspecified part of upper end of humerus   . Complete rupture of rotator cuff     right shoulder- limited range of motion  . Other vitamin B12 deficiency anemia   . Neurogenic bladder, NOS     02-22-14 some bladder issues of urgency is somewhat improve.  Marland Kitchen COPD (chronic obstructive pulmonary disease)   . Type II or unspecified type diabetes mellitus without mention of complication, uncontrolled     diet controlled  . Essential hypertension, benign     off lisinporil for last 2 months  . Esophageal stricture 12/28/2013  . Cancer     colon cancer 01-12-14- no further tx. intended  . Arrhythmia     7-23-15x1beginning of colon surgery 01-12-14 - no problems since.    Past Surgical History  Procedure Laterality Date  . Orif shoulder fracture Right 2011    arthroplasty  . Tonsillectomy and adenoidectomy  age 22 or 66  . Eye surgery Bilateral 2010    both eyes lens replacments  . Esophagogastroduodenoscopy (egd) with propofol N/A 01/09/2014    Procedure: ESOPHAGOGASTRODUODENOSCOPY (EGD) WITH PROPOFOL;  Surgeon: [name], MD;  Location: [facility];  Service: Endoscopy;  Laterality: N/A;  . Colonoscopy N/A 01/10/2014    Procedure: COLONOSCOPY;  Surgeon: [name], MD;  Location: [facility];  Service: Endoscopy;  Laterality: N/A;  . Laparoscopic partial colectomy N/A 01/12/2014    Procedure: LAPAROSCOPIC ASSISTED PARTIAL COLECTOMY AND REMOVAL OF RECTAL POLYP;  Surgeon: Odis Hollingshead, MD;  Location: [facility];  Service: General;  Laterality: N/A;  . Esophagogastroduodenoscopy N/A 01/16/2014    Procedure: ESOPHAGOGASTRODUODENOSCOPY (EGD);  Surgeon: Gatha Mayer, MD;  Location: [facility];  Service: Endoscopy;  Laterality: N/A;  . Esophagogastroduodenoscopy (egd) with propofol N/A 01/19/2014    Procedure: ESOPHAGOGASTRODUODENOSCOPY (EGD) WITH PROPOFOL;  Surgeon: Gatha Mayer, MD;  Location: [facility];  Service: Endoscopy;  Laterality: N/A;  . Cataract  extraction, bilateral Bilateral   . Dilation and curettage of uterus    . Esophagogastroduodenoscopy N/A 03/02/2014    Procedure: ESOPHAGOGASTRODUODENOSCOPY (EGD);  Surgeon: Gatha Mayer, MD;  Location: [facility];  Service: Endoscopy;  Laterality: N/A;  . Balloon dilation N/A 03/02/2014    Procedure: BALLOON DILATION;  Surgeon: Gatha Mayer, MD;  Location: [facility];  Service: Endoscopy;  Laterality: N/A;  . Colon surgery      History   Social History  . Marital Status: Married    Spouse Name: N/A    Number of Children: 4  . Years of Education: N/A   Occupational History  . retired    Social History Main Topics  . Smoking status: Former Smoker -- 1.00 packs/day for 20 years    Types: Cigarettes    Quit date: 08/03/2005  . Smokeless tobacco: Never Used  . Alcohol Use: No  .  Drug Use: No  . Sexual Activity: No   Other Topics Concern  . 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 2 caffeinated beverages daily.   Lives in [place].    Family History  Problem Relation Age of Onset  . Diabetes Father   . Bone cancer Father     bone marrow  . Emphysema Mother   . Pneumonia Mother     ROS: History of intestinal bleeding, esophagitis, COPD, diabetes, but denies stroke, hemoptysis, hematemesis, hematochezia. . Other systems negative for complaints.  OBJECTIVE: BP 139/76  Pulse 59  Ht 5\' 1"  (1.549 m)  Wt 103 lb 6.4 oz (46.902 kg)  BMI 19.55 kg/m2,  General: No acute distress, elderly, frail, appearing older than stated age.  HEENT: normal good skin color  Neck: JVD flat . Carotids absent  Chest: Clear  Cardiac: Murmur: Absent . Gallop: S4 . Rhythm: Normal . Other: Normal  Abdomen: Bruit: Absent . Pulsation: None  Extremities: Edema: None . Pulses: 2+  Neuro: Normal  Psych: Depressed appearing   ECG: Not repeated*

## 2014-05-07 NOTE — Patient Instructions (Signed)
Your physician has recommended you make the following change in your medication:  1) INCREASE Aspirin to 325mg  daily  Your physician wants you to follow-up in: 6 months with an EKG You will receive a reminder letter in the mail two months in advance. If you don't receive a letter, please call our office to schedule the follow-up appointment.

## 2014-05-14 ENCOUNTER — Ambulatory Visit (INDEPENDENT_AMBULATORY_CARE_PROVIDER_SITE_OTHER)
Admission: RE | Admit: 2014-05-14 | Discharge: 2014-05-14 | Disposition: A | Discharge status: Home / Self Care | Payer: Medicare HMO | Source: Ambulatory Visit | Attending: Pulmonary Disease | Admitting: Pulmonary Disease

## 2014-05-14 DIAGNOSIS — J189 Pneumonia, unspecified organism: Secondary | ICD-10-CM

## 2014-05-16 ENCOUNTER — Telehealth: Payer: Self-pay

## 2014-05-16 NOTE — Telephone Encounter (Signed)
LMTCB X1 on home vm, lmtcb with husband to have pt call back.  Pt needs to be seen this week if possible with TP or BQ.

## 2014-05-16 NOTE — Telephone Encounter (Signed)
Pt returned Ashley's call - [phone]

## 2014-05-16 NOTE — Telephone Encounter (Signed)
Pt aware of results/recs- appt made with BQ for 2:30 on Friday.  Nothing further needed at this time.

## 2014-05-16 NOTE — Telephone Encounter (Signed)
Message copied by Len Blalock on Wed May 16, 2014  3:08 PM ------      Message from: Juanito Doom      Created: Mon May 14, 2014  9:04 PM       Caryl Pina,            Please let her know that her chest x-ray has not cleared up and she needs to see somebody this week in clinic. Either me if I have an available spot or Tammy.            Thanks,       ------

## 2014-05-18 ENCOUNTER — Ambulatory Visit (INDEPENDENT_AMBULATORY_CARE_PROVIDER_SITE_OTHER): Payer: Medicare HMO | Admitting: Pulmonary Disease

## 2014-05-18 ENCOUNTER — Encounter: Payer: Self-pay | Admitting: Pulmonary Disease

## 2014-05-18 VITALS — BP 132/68 | HR 57 | Ht 61.0 in | Wt 106.0 lb

## 2014-05-18 DIAGNOSIS — J432 Centrilobular emphysema: Secondary | ICD-10-CM

## 2014-05-18 DIAGNOSIS — J9611 Chronic respiratory failure with hypoxia: Secondary | ICD-10-CM

## 2014-05-18 DIAGNOSIS — J189 Pneumonia, unspecified organism: Secondary | ICD-10-CM

## 2014-05-18 NOTE — Progress Notes (Signed)
Subjective:    Patient ID: Amanda [name], female    DOB: 1940/09/05, 73 y.o.   MRN: [id number]  Synopsis> likely COPD, first seen by [facility] 2015. Also diagnosed with stage I colon cancer requiring a partial colectomy in 2015.  HPI  Chief Complaint  Patient presents with  . Follow-up    Follow up pna.  Pt states she breathes well as long as she continually takes her medications.  also states she wants her 02 taken out of her home, states she has not worn it in over a month.     05/19/2015 ROV> Kemari says that her breathing is much better and her cough has resolved..  She is no longer using the oxygen anymore because she feels that she doesn't use it.   Her leg swelling has improved significantly since the last visit.  She has gained three pounds. Overall she feels much better.  She is still taking her inhalers as written.   Past Medical History  Diagnosis Date  . Emphysema lung   . Hyperlipidemia   . GERD (gastroesophageal reflux disease)   . Vitamin D deficiency   . Closed fracture of unspecified part of upper end of humerus   . Complete rupture of rotator cuff     right shoulder- limited range of motion  . Other vitamin B12 deficiency anemia   . Neurogenic bladder, NOS     02-22-14 some bladder issues of urgency is somewhat improve.  Marland Kitchen COPD (chronic obstructive pulmonary disease)   . Type II or unspecified type diabetes mellitus without mention of complication, uncontrolled     diet controlled  . Essential hypertension, benign     off lisinporil for last 2 months  . Esophageal stricture 12/28/2013  . Cancer     colon cancer 01-12-14- no further tx. intended  . Arrhythmia     7-23-15x1beginning of colon surgery 01-12-14 - no problems since.     Review of Systems  Constitutional: Negative for fever, chills and fatigue.  HENT: Negative for postnasal drip, rhinorrhea and sinus pressure.   Respiratory: Negative for cough, shortness of breath and wheezing.     Cardiovascular: Negative for chest pain, palpitations and leg swelling.       Objective:   Physical Exam  Filed Vitals:   05/18/14 1432  BP: 132/68  Pulse: 57  Height: 5\' 1"  (1.549 m)  Weight: 106 lb (48.081 kg)  SpO2: 97%  RA   On RA at rest O2 saturation 97%, dropped to 83% on RA, improved on 2L [place]  Gen: well appearing, no acute distress HEENT: NCAT, OP Clear,  PULM: few crackles RUL, otherwise clear CV: Irreg irreg, slight systolic murmur, no JVD AB: BS+, soft, nontender, no hsm Ext: warm, no edema, no clubbing, no cyanosis      Assessment & Plan:   COPD (chronic obstructive pulmonary disease) This has been a stable interval for Nicle despite the changes on her chest x-ray  Plan: -continue current therapy  HCAP (healthcare-associated pneumonia) I am concerned that the findings on her chest x-ray have worsened.  However, she looks great today compared to the last time I saw her.  I'm not sure if this represents an atypical infection or less likely a malignancy.  The CT from earlier this year did not show anything in the right upper lobe so malignancy seems less likely.  Plan: -CT chest -depending on findings from CT chest, may need bronchoscopy. Today she and I discussed this  Chronic  respiratory failure with hypoxia She walked 500 feet today on RA and her O2 saturation remained above 93%  D/c O2    Updated Medication List Outpatient Encounter Prescriptions as of 05/18/2014  Medication Sig  . aspirin EC 325 MG tablet Take 1 tablet (325 mg total) by mouth daily.  . budesonide (PULMICORT) 0.25 MG/2ML nebulizer solution Inhale 0.25 mLs into the lungs.  Marland Kitchen diltiazem (CARDIZEM CD) 120 MG 24 hr capsule Take 1 capsule (120 mg total) by mouth daily.  . ferrous sulfate 325 (65 FE) MG tablet Take 1 tablet (325 mg total) by mouth daily.  . Fluticasone-Salmeterol (ADVAIR DISKUS) 250-50 MCG/DOSE AEPB Inhale 1 puff into the lungs 2 (two) times daily.  Marland Kitchen glimepiride  (AMARYL) 1 MG tablet Take 1 tablet (1 mg total) by mouth daily with breakfast.  . levalbuterol (XOPENEX HFA) 45 MCG/ACT inhaler Inhale 2 puffs into the lungs every 6 (six) hours as needed for wheezing or shortness of breath.  . levalbuterol (XOPENEX) 0.63 MG/3ML nebulizer solution Take 3 mLs (0.63 mg total) by nebulization every 6 (six) hours as needed for wheezing or shortness of breath.  . metoprolol tartrate (LOPRESSOR) 25 MG tablet Take 1 tablet (25 mg total) by mouth 2 (two) times daily.  Marland Kitchen omeprazole (PRILOSEC) 40 MG capsule Take 40 mg by mouth daily.  . [DISCONTINUED] Alum & Mag Hydroxide-Simeth (MAGIC MOUTHWASH W/LIDOCAINE) SOLN Take 5 mLs by mouth 3 (three) times daily as needed for mouth pain.  . [DISCONTINUED] amLODipine (NORVASC) 5 MG tablet Take 5 mg by mouth daily.  . [DISCONTINUED] amoxicillin-clavulanate (AUGMENTIN) 875-125 MG per tablet Take 1 tablet by mouth every 12 (twelve) hours.  . [DISCONTINUED] predniSONE (DELTASONE) 20 MG tablet Take 2 tables by mouth daily X 3 days; then 1 tablet by mouth daily X 3 days; then 1/2 tablet by mouth daily X 3 days and stop prednisone

## 2014-05-18 NOTE — Assessment & Plan Note (Signed)
This has been a stable interval for Amanda [name] despite the changes on her chest x-ray  Plan: -continue current therapy

## 2014-05-18 NOTE — Assessment & Plan Note (Addendum)
She walked 500 feet today on RA and her O2 saturation remained above 93%  D/c O2

## 2014-05-18 NOTE — Assessment & Plan Note (Signed)
I am concerned that the findings on her chest x-ray have worsened.  However, she looks great today compared to the last time I saw her.  I'm not sure if this represents an atypical infection or less likely a malignancy.  The CT from earlier this year did not show anything in the right upper lobe so malignancy seems less likely.  Plan: -CT chest -depending on findings from CT chest, may need bronchoscopy. Today she and I discussed this

## 2014-05-18 NOTE — Patient Instructions (Addendum)
We will arrange a CT scan of your chest and will call you with the results You may need a bronchoscopy depending on the result of the CT Otherwise keep your previously scheduled follow up appointment in 2 months

## 2014-05-25 ENCOUNTER — Ambulatory Visit (INDEPENDENT_AMBULATORY_CARE_PROVIDER_SITE_OTHER)
Admission: RE | Admit: 2014-05-25 | Discharge: 2014-05-25 | Disposition: A | Discharge status: Home / Self Care | Payer: Medicare HMO | Source: Ambulatory Visit | Attending: Pulmonary Disease | Admitting: Pulmonary Disease

## 2014-05-25 DIAGNOSIS — J189 Pneumonia, unspecified organism: Secondary | ICD-10-CM

## 2014-06-04 ENCOUNTER — Telehealth: Payer: Self-pay | Admitting: Pulmonary Disease

## 2014-06-04 NOTE — Telephone Encounter (Signed)
Please let her know that her CT chest showed that the pneumonia hasn't gone away and she and I need to talk about this in clinic soon. Please make sure she has an appointment in 1-2 weeks.     Thanks    Called and spoke with pt and she is aware of results per BQ.  She has scheduled appt on 11/20 at 11:30 to discuss with BQ. Nothing further is needed.

## 2014-06-22 ENCOUNTER — Encounter: Payer: Self-pay | Admitting: Pulmonary Disease

## 2014-06-22 ENCOUNTER — Ambulatory Visit (INDEPENDENT_AMBULATORY_CARE_PROVIDER_SITE_OTHER): Payer: Medicare HMO | Admitting: Pulmonary Disease

## 2014-06-22 VITALS — BP 134/76 | HR 54 | Ht 61.0 in | Wt 113.0 lb

## 2014-06-22 DIAGNOSIS — J189 Pneumonia, unspecified organism: Secondary | ICD-10-CM

## 2014-06-22 NOTE — Assessment & Plan Note (Signed)
Amanda [name] had a CT chest which showed a persistent right upper lobe infiltrate. This has developed since June 2015 when she last had a CT chest. Clinically, it sounds like this is nothing more than pneumonia which is taking a long time to clear up because of her emphysema. I am encouraged by the fact that she has been gaining weight and feels much better. Today we talked about the fact that I cannot rule out a malignancy or atypical infection or an etiology like organizing pneumonia but considering the fact that her clinical status has improved I would prefer to hold off on a biopsy at this point and just monitor with serial imaging.  We discussed her options today and she prefers this approach.  Plan: -Repeat chest x-ray in January and follow-up in clinic with me

## 2014-06-22 NOTE — Patient Instructions (Signed)
Keep taking your Advair We will order a Chest x-ray for January and see you after that

## 2014-06-22 NOTE — Progress Notes (Signed)
Subjective:    Patient ID: Amanda [name], female    DOB: 1941/07/31, 73 y.o.   MRN: [id number]  Synopsis> likely COPD, first seen by [facility] 2015. Also diagnosed with stage I colon cancer requiring a partial colectomy in 2015.  HPI  Chief Complaint  Patient presents with  . Follow-up    2 month rov. Pt feels well today.     06/22/2014 ROV > Amanda [name] says that she is feeling OK. She says that she continues to take Advair, her breathing has been OK.  She has gained a few pounds (about ten).  She is not coughing up anything, no pain.  She had pneumonia in August and has been feeling much better since then. She denies coughing up blood.   Past Medical History  Diagnosis Date  . Emphysema lung   . Hyperlipidemia   . GERD (gastroesophageal reflux disease)   . Vitamin D deficiency   . Closed fracture of unspecified part of upper end of humerus   . Complete rupture of rotator cuff     right shoulder- limited range of motion  . Other vitamin B12 deficiency anemia   . Neurogenic bladder, NOS     02-22-14 some bladder issues of urgency is somewhat improve.  Amanda [name] Kitchen COPD (chronic obstructive pulmonary disease)   . Type II or unspecified type diabetes mellitus without mention of complication, uncontrolled     diet controlled  . Essential hypertension, benign     off lisinporil for last 2 months  . Esophageal stricture 12/28/2013  . Cancer     colon cancer 01-12-14- no further tx. intended  . Arrhythmia     7-23-15x1beginning of colon surgery 01-12-14 - no problems since.     Review of Systems  Constitutional: Negative for fever, chills and fatigue.  HENT: Negative for postnasal drip, rhinorrhea and sinus pressure.   Respiratory: Negative for cough, shortness of breath and wheezing.   Cardiovascular: Negative for chest pain, palpitations and leg swelling.       Objective:   Physical Exam  Filed Vitals:   06/22/14 1132  BP: 134/76  Pulse: 54  Height: 5\' 1"  (1.549 m)  Weight:  113 lb (51.256 kg)  SpO2: 94%  RA   Gen: well appearing, no acute distress HEENT: NCAT, OP Clear,  PULM: Clear to auscultation bilaterally CV: Irreg irreg, slight systolic murmur, no JVD AB: BS+, soft, nontender, no hsm Ext: warm, no edema, no clubbing, no cyanosis      Assessment & Plan:   HCAP (healthcare-associated pneumonia) Necie had a CT chest which showed a persistent right upper lobe infiltrate. This has developed since June 2015 when she last had a CT chest. Clinically, it sounds like this is nothing more than pneumonia which is taking a long time to clear up because of her emphysema. I am encouraged by the fact that she has been gaining weight and feels much better. Today we talked about the fact that I cannot rule out a malignancy or atypical infection or an etiology like organizing pneumonia but considering the fact that her clinical status has improved I would prefer to hold off on a biopsy at this point and just monitor with serial imaging.  We discussed her options today and she prefers this approach.  Plan: -Repeat chest x-ray in January and follow-up in clinic with me    Updated Medication List Outpatient Encounter Prescriptions as of 06/22/2014  Medication Sig  . aspirin EC 325 MG tablet Take 1 tablet (  325 mg total) by mouth daily.  . budesonide (PULMICORT) 0.25 MG/2ML nebulizer solution Inhale 0.25 mLs into the lungs.  . ferrous sulfate 325 (65 FE) MG tablet Take 1 tablet (325 mg total) by mouth daily.  . Fluticasone-Salmeterol (ADVAIR DISKUS) 250-50 MCG/DOSE AEPB Inhale 1 puff into the lungs 2 (two) times daily.  Amanda [name] Kitchen glimepiride (AMARYL) 1 MG tablet Take 1 tablet (1 mg total) by mouth daily with breakfast.  . levalbuterol (XOPENEX HFA) 45 MCG/ACT inhaler Inhale 2 puffs into the lungs every 6 (six) hours as needed for wheezing or shortness of breath.  . levalbuterol (XOPENEX) 0.63 MG/3ML nebulizer solution Take 3 mLs (0.63 mg total) by nebulization every 6 (six)  hours as needed for wheezing or shortness of breath.  . metoprolol tartrate (LOPRESSOR) 25 MG tablet Take 1 tablet (25 mg total) by mouth 2 (two) times daily.  Amanda [name] Kitchen omeprazole (PRILOSEC) 40 MG capsule Take 40 mg by mouth daily.  . [DISCONTINUED] diltiazem (CARDIZEM CD) 120 MG 24 hr capsule Take 1 capsule (120 mg total) by mouth daily.

## 2014-08-07 ENCOUNTER — Other Ambulatory Visit (HOSPITAL_BASED_OUTPATIENT_CLINIC_OR_DEPARTMENT_OTHER): Payer: Medicare HMO

## 2014-08-07 ENCOUNTER — Ambulatory Visit (HOSPITAL_BASED_OUTPATIENT_CLINIC_OR_DEPARTMENT_OTHER): Payer: Medicare HMO | Admitting: Oncology

## 2014-08-07 ENCOUNTER — Telehealth: Payer: Self-pay | Admitting: Oncology

## 2014-08-07 VITALS — BP 189/48 | HR 41 | Temp 97.7°F | Resp 18 | Wt 110.2 lb

## 2014-08-07 DIAGNOSIS — C189 Malignant neoplasm of colon, unspecified: Secondary | ICD-10-CM

## 2014-08-07 DIAGNOSIS — C180 Malignant neoplasm of cecum: Secondary | ICD-10-CM

## 2014-08-07 DIAGNOSIS — D509 Iron deficiency anemia, unspecified: Secondary | ICD-10-CM

## 2014-08-07 LAB — COMPREHENSIVE METABOLIC PANEL (CC13)
ALBUMIN: 3.8 g/dL (ref 3.5–5.0)
ALT: 8 U/L (ref 0–55)
ANION GAP: 8 meq/L (ref 3–11)
AST: 16 U/L (ref 5–34)
Alkaline Phosphatase: 75 U/L (ref 40–150)
BUN: 14.7 mg/dL (ref 7.0–26.0)
CALCIUM: 9.7 mg/dL (ref 8.4–10.4)
CHLORIDE: 102 meq/L (ref 98–109)
CO2: 29 meq/L (ref 22–29)
Creatinine: 1 mg/dL (ref 0.6–1.1)
EGFR: 54 mL/min/{1.73_m2} — ABNORMAL LOW (ref >90)
GLUCOSE: 211 mg/dL — AB (ref 70–140)
Potassium: 4.3 mEq/L (ref 3.5–5.1)
Sodium: 139 mEq/L (ref 136–145)
Total Bilirubin: 0.33 mg/dL (ref 0.20–1.20)
Total Protein: 7.2 g/dL (ref 6.4–8.3)

## 2014-08-07 LAB — CBC WITH DIFFERENTIAL/PLATELET
BASO%: 0.4 % (ref 0.0–2.0)
Basophils Absolute: 0 10*3/uL (ref 0.0–0.1)
EOS ABS: 0.3 10*3/uL (ref 0.0–0.5)
EOS%: 3.2 % (ref 0.0–7.0)
HCT: 41.2 % (ref 34.8–46.6)
HEMOGLOBIN: 13.2 g/dL (ref 11.6–15.9)
LYMPH#: 1.2 10*3/uL (ref 0.9–3.3)
LYMPH%: 14.8 % (ref 14.0–49.7)
MCH: 28.1 pg (ref 25.1–34.0)
MCHC: 32 g/dL (ref 31.5–36.0)
MCV: 87.8 fL (ref 79.5–101.0)
MONO#: 0.4 10*3/uL (ref 0.1–0.9)
MONO%: 4.7 % (ref 0.0–14.0)
NEUT%: 76.9 % — ABNORMAL HIGH (ref 38.4–76.8)
NEUTROS ABS: 6.4 10*3/uL (ref 1.5–6.5)
Platelets: 298 10*3/uL (ref 145–400)
RBC: 4.69 10*6/uL (ref 3.70–5.45)
RDW: 12.9 % (ref 11.2–14.5)
WBC: 8.3 10*3/uL (ref 3.9–10.3)

## 2014-08-07 LAB — CEA: CEA: 1.4 ng/mL (ref 0.0–5.0)

## 2014-08-07 LAB — IRON AND TIBC CHCC
%SAT: 27 % (ref 21–57)
Iron: 87 ug/dL (ref 41–142)
TIBC: 319 ug/dL (ref 236–444)
UIBC: 232 ug/dL (ref 120–384)

## 2014-08-07 LAB — FERRITIN CHCC: FERRITIN: 24 ng/mL (ref 9–269)

## 2014-08-07 NOTE — Telephone Encounter (Signed)
Pt confirmed labs/ov per 01/05 POF, gave pt AVS..... KJ,

## 2014-08-07 NOTE — Progress Notes (Signed)
Hematology and Oncology Follow Up Visit  Amanda [name] [id number] 12-09-1940 74 y.o. 08/07/2014 10:31 AM Amanda [name], MDLittle, Amanda Rinks, MD   Principle Diagnosis: 74 year old woman with colon cancer diagnosed in June 2015. She presented with a cecal mass measuring 2.2 cm. she had stage I colon cancer.  Prior Therapy: She is status post right hemicolectomy with a tumor showing T2 N0 with 0/17 out of lymph nodes involved with cancer   Current therapy: Observation and surveillance.  Interim History: Amanda [name] presents today for a follow-up visit. Since the last visit, she has been doing very well. She was hospitalized in August 2015 for shortness of breath and presumable pneumonia but the symptoms have resolved now. She is no longer reporting any cough or shortness of breath. She does not report any hematochezia or melena. She does not report any change in her bowel habits. She does not report any abdominal pain. She has resumed all activities of daily living without any decline.  She does not report any headaches or blurry vision or double vision. She has not reported any syncope or seizures. She does not report any fevers or chills or sweats. She is not reporting any chest pain or difficulty breathing. She does not report any cough or hemoptysis. Does not report any palpitation or leg edema. Does not report any orthopnea or PND. She is no longer reporting any nausea or vomiting or abdominal pain. She does not report any constipation or diarrhea. She does not report any frequency urgency or urinary complications. Does not report any skeletal complaints. She has not reported any rashes or lesions. Rest of the review of systems unremarkable.  Medications: I have reviewed the patient's current medications.  Current Outpatient Prescriptions  Medication Sig Dispense Refill  . aspirin EC 325 MG tablet Take 1 tablet (325 mg total) by mouth daily.    . budesonide (PULMICORT) 0.25 MG/2ML nebulizer solution  Inhale 0.25 mLs into the lungs.    . ferrous sulfate 325 (65 FE) MG tablet Take 1 tablet (325 mg total) by mouth daily. 30 tablet 0  . Fluticasone-Salmeterol (ADVAIR DISKUS) 250-50 MCG/DOSE AEPB Inhale 1 puff into the lungs 2 (two) times daily. 60 each 5  . glimepiride (AMARYL) 1 MG tablet Take 1 tablet (1 mg total) by mouth daily with breakfast. 30 tablet 1  . levalbuterol (XOPENEX HFA) 45 MCG/ACT inhaler Inhale 2 puffs into the lungs every 6 (six) hours as needed for wheezing or shortness of breath. 1 Inhaler 12  . levalbuterol (XOPENEX) 0.63 MG/3ML nebulizer solution Take 3 mLs (0.63 mg total) by nebulization every 6 (six) hours as needed for wheezing or shortness of breath. 3 mL 2  . metoprolol tartrate (LOPRESSOR) 25 MG tablet Take 1 tablet (25 mg total) by mouth 2 (two) times daily. 60 tablet 1  . omeprazole (PRILOSEC) 40 MG capsule Take 40 mg by mouth daily.     No current facility-administered medications for this visit.     Allergies: No Known Allergies  Past Medical History, Surgical history, Social history, and Family History were reviewed and updated.   Physical Exam: Blood pressure 189/48, pulse 41, temperature 97.7 F (36.5 C), temperature source Oral, resp. rate 18, weight 110 lb 4 oz (50.009 kg). ECOG: 1 General appearance: alert and cooperative Head: Normocephalic, without obvious abnormality Neck: no adenopathy Lymph nodes: Cervical, supraclavicular, and axillary nodes normal. Heart:regular rate and rhythm, S1, S2 normal, no murmur, click, rub or gallop Lung:chest clear, no wheezing, rales, normal symmetric air entry Abdomin:  soft, non-tender, without masses or organomegaly EXT:no erythema, induration, or nodules   Lab Results: Lab Results  Component Value Date   WBC 8.3 08/07/2014   HGB 13.2 08/07/2014   HCT 41.2 08/07/2014   MCV 87.8 08/07/2014   PLT 298 08/07/2014     Chemistry      Component Value Date/Time   NA 135* 03/30/2014 0440   K 4.8  03/30/2014 0440   CL 104 03/30/2014 0440   CO2 22 03/30/2014 0440   BUN 18 03/30/2014 0440   CREATININE 1.00 03/30/2014 0440      Component Value Date/Time   CALCIUM 9.2 03/30/2014 0440   ALKPHOS 78 03/28/2014 2004   AST 13 03/28/2014 2004   ALT 7 03/28/2014 2004   BILITOT 0.3 03/28/2014 2004       Impression and Plan:  75 year old woman with the following issues:  1. Colon cancer diagnosed in June of 2015 after presenting with a cecal mass measuring 2.2 cm. She is status post right hemicolectomy with a tumor showing T2 N0 with 0/17 out of lymph nodes involved with cancer. Her staging workup did not reveal any metastatic disease indicating a stage I colon cancer. She is currently on active surveillance and she has no signs or symptoms of recurrent disease. The plan is to repeat physical examination, laboratory testing and a CT scan in 6 months. She will need physical examination every 6 months and CT scan on annual basis.  2. Iron deficiency anemia: Her hemoglobin is normal today and her iron stores are pending. We can certainly recommend IV iron supplements if needed to.  3. Surveillance colonoscopy: She will need a repeat colonoscopy in June of 2016.  4. Followup: Will be in 6 months for a clinical visit, CT scan and repeat laboratory testing.        SHADAD,FIRAS, MD 1/5/201610:31 AM

## 2014-09-17 ENCOUNTER — Ambulatory Visit: Payer: Medicare HMO | Admitting: Pulmonary Disease

## 2014-10-12 IMAGING — CR DG CHEST 1V PORT
1 series · 1 of 1 positions shown · non-contrast
Comparison: 01/08/2014.

CLINICAL DATA: Shortness of breath.

EXAM:
PORTABLE CHEST - 1 VIEW

[AP]
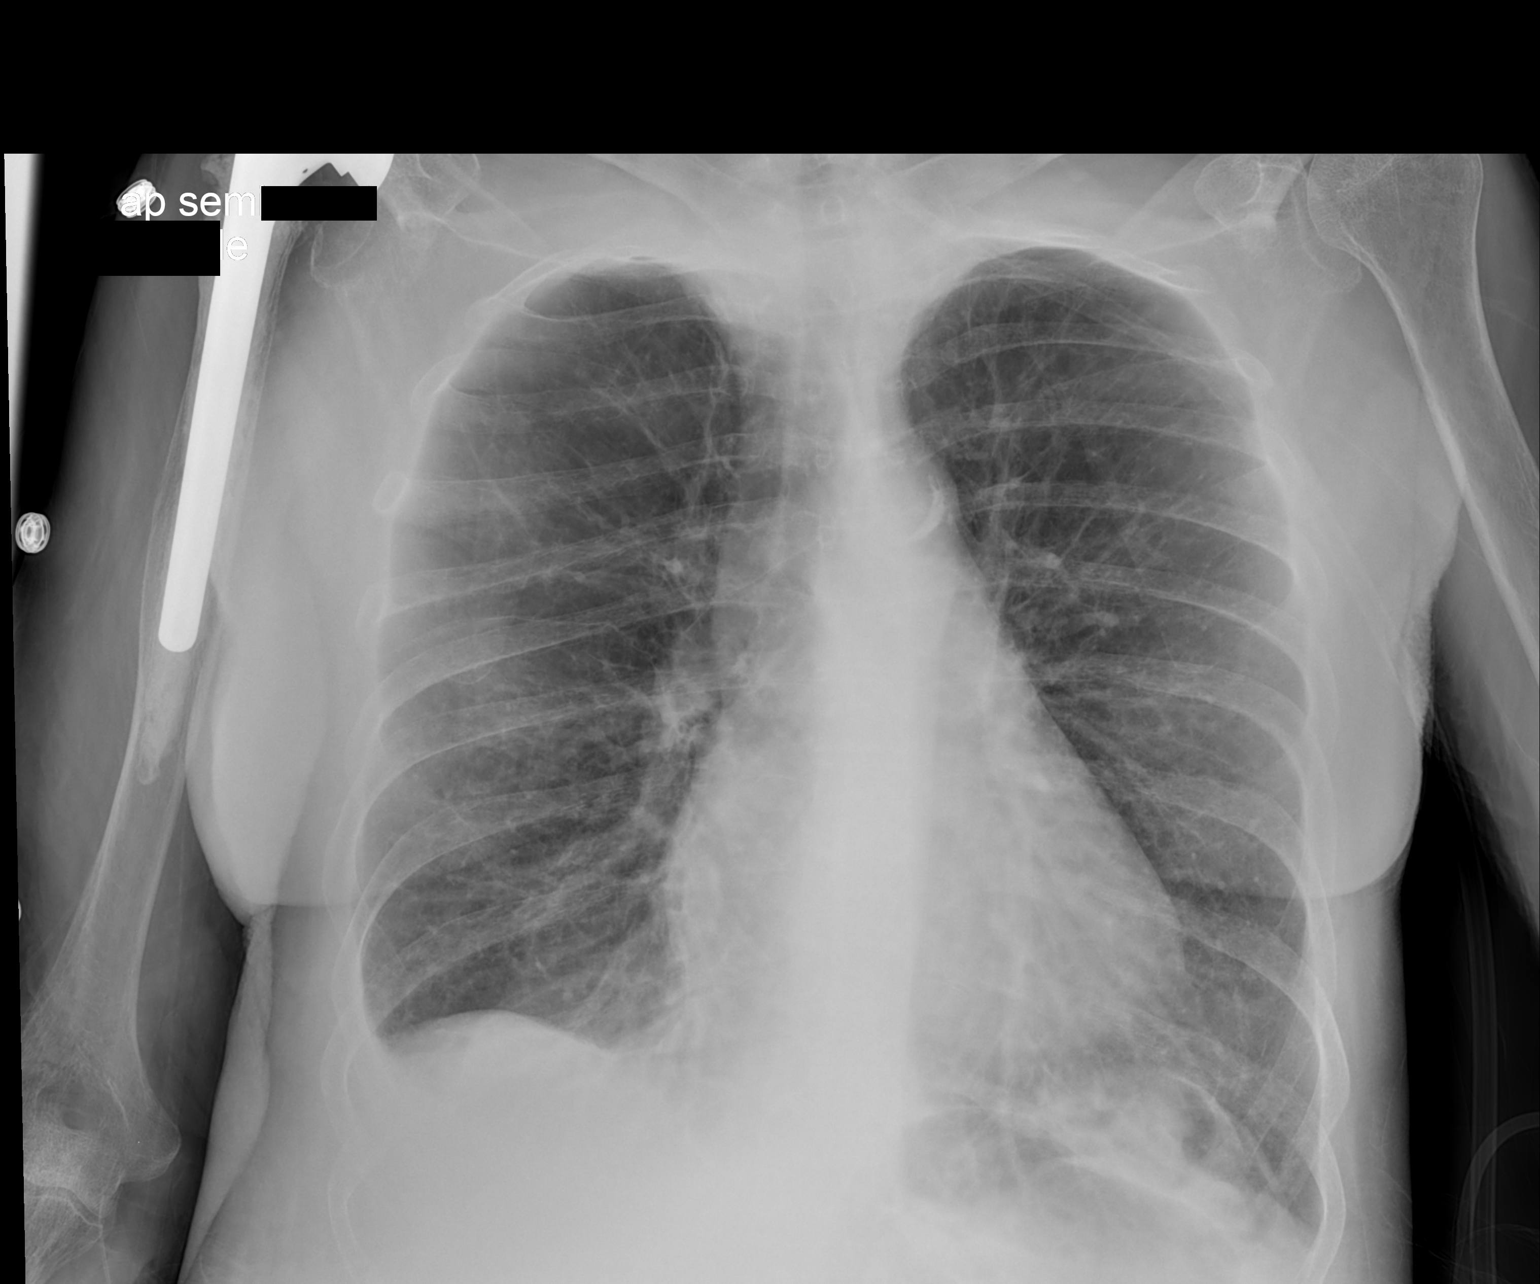

[1 of 1 positions shown; findings below may reference images not displayed]

FINDINGS: Mediastinum and hilar structures are normal. Mild cardiomegaly with
mild interstitial prominence a right-sided pleural effusion. These
findings suggest mild congestive heart failure. COPD. Right shoulder
replacements. No acute bony abnormality .
IMPRESSION: 1. Mild congestive heart failure.
2. COPD.

## 2014-10-25 ENCOUNTER — Ambulatory Visit (INDEPENDENT_AMBULATORY_CARE_PROVIDER_SITE_OTHER): Payer: Medicare HMO | Admitting: Pulmonary Disease

## 2014-10-25 ENCOUNTER — Ambulatory Visit (INDEPENDENT_AMBULATORY_CARE_PROVIDER_SITE_OTHER)
Admission: RE | Admit: 2014-10-25 | Discharge: 2014-10-25 | Disposition: A | Discharge status: Home / Self Care | Payer: Medicare HMO | Source: Ambulatory Visit | Attending: Pulmonary Disease | Admitting: Pulmonary Disease

## 2014-10-25 ENCOUNTER — Encounter: Payer: Self-pay | Admitting: Pulmonary Disease

## 2014-10-25 VITALS — BP 122/64 | HR 63 | Ht 61.0 in | Wt 113.0 lb

## 2014-10-25 DIAGNOSIS — E119 Type 2 diabetes mellitus without complications: Secondary | ICD-10-CM

## 2014-10-25 DIAGNOSIS — J432 Centrilobular emphysema: Secondary | ICD-10-CM | POA: Diagnosis not present

## 2014-10-25 DIAGNOSIS — B370 Candidal stomatitis: Secondary | ICD-10-CM

## 2014-10-25 MED ORDER — MAGIC MOUTHWASH: 5.0000 mL | Freq: Three times a day (TID) | ORAL | Status: DC | PRN

## 2014-10-25 NOTE — Assessment & Plan Note (Signed)
Clinically this has resolved, however if it has not resolved radiographically then she will need a CT and possibly a bronchoscopy.  Unfortuantely she missed our appointment in January due to the weather.  Plan: -CXR today

## 2014-10-25 NOTE — Patient Instructions (Signed)
We will call you with the results of the Chest X-ray and call you with the results  We will see you back in 3 months or sooner if needed

## 2014-10-25 NOTE — Assessment & Plan Note (Signed)
We'll prescribe nystatin swish and spit Advised to rinse mouth after using inhalers Use baking soda-based toothpaste

## 2014-10-25 NOTE — Progress Notes (Signed)
Subjective:    Patient ID: Amanda [name], female    DOB: 02/11/1941, 74 y.o.   MRN: [id number]  Synopsis> likely COPD, first seen by [facility] 2015. Also diagnosed with stage I colon cancer requiring a partial colectomy in 2015.  HPI Chief Complaint  Patient presents with  . Follow-up    pt states she is doing well as long as she takes her inhaled meds regularly.     Amanda [name] has been doing well recently.  She has not been back to oncology.  She feels like she is breathing OK. She continues to take Advair and Spiriva.  She denies cough.  She has not been active.  She continues to walk regularly.  She doesn't exercise.    Past Medical History  Diagnosis Date  . Emphysema lung   . Hyperlipidemia   . GERD (gastroesophageal reflux disease)   . Vitamin D deficiency   . Closed fracture of unspecified part of upper end of humerus   . Complete rupture of rotator cuff     right shoulder- limited range of motion  . Other vitamin B12 deficiency anemia   . Neurogenic bladder, NOS     02-22-14 some bladder issues of urgency is somewhat improve.  Marland Kitchen COPD (chronic obstructive pulmonary disease)   . Type II or unspecified type diabetes mellitus without mention of complication, uncontrolled     diet controlled  . Essential hypertension, benign     off lisinporil for last 2 months  . Esophageal stricture 12/28/2013  . Cancer     colon cancer 01-12-14- no further tx. intended  . Arrhythmia     7-23-15x1beginning of colon surgery 01-12-14 - no problems since.     Review of Systems  Constitutional: Negative for fever, chills and fatigue.  HENT: Negative for postnasal drip, rhinorrhea and sinus pressure.   Respiratory: Negative for cough, shortness of breath and wheezing.   Cardiovascular: Negative for chest pain, palpitations and leg swelling.       Objective:   Physical Exam Filed Vitals:   10/25/14 1422  BP: 122/64  Pulse: 63  Height: 5\' 1"  (1.549 m)  Weight: 113 lb (51.256 kg)    SpO2: 94%  RA   Gen: well appearing, no acute distress HEENT: NCAT, OP Clear, perhaps small patch of mild thrush R gum maxillary aspect PULM: Clear to auscultation bilaterally CV: Irreg irreg, slight systolic murmur, no JVD AB: BS+, soft, nontender, no hsm Ext: warm, no edema, no clubbing, no cyanosis  Chest CT from 2015 was reviewed     Assessment & Plan:   COPD (chronic obstructive pulmonary disease) This has been a stable interval for her.  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  Plan: -Continue Advair and Spiriva (or a formulary alternative to her Advair is OK by me) -f/u 3 months   HCAP (healthcare-associated pneumonia) Clinically this has resolved, however if it has not resolved radiographically then she will need a CT and possibly a bronchoscopy.  Unfortuantely she missed our appointment in January due to the weather.  Plan: -CXR today   Thrush We'll prescribe nystatin swish and spit Advised to rinse mouth after using inhalers Use baking soda-based toothpaste     Updated Medication List Outpatient Encounter Prescriptions as of 10/25/2014  Medication Sig  . aspirin EC 325 MG tablet Take 1 tablet (325 mg total) by mouth daily.  . ferrous sulfate 325 (65 FE) MG  tablet Take 1 tablet (325 mg total) by mouth daily.  . Fluticasone-Salmeterol (ADVAIR DISKUS) 250-50 MCG/DOSE AEPB Inhale 1 puff into the lungs 2 (two) times daily.  Marland Kitchen glimepiride (AMARYL) 1 MG tablet Take 1 tablet (1 mg total) by mouth daily with breakfast.  . levalbuterol (XOPENEX HFA) 45 MCG/ACT inhaler Inhale 2 puffs into the lungs every 6 (six) hours as needed for wheezing or shortness of breath.  . levalbuterol (XOPENEX) 0.63 MG/3ML nebulizer solution Take 3 mLs (0.63 mg total) by nebulization every 6 (six) hours as needed for wheezing or shortness of breath.  . metFORMIN (GLUCOPHAGE) 500 MG tablet Take 500 mg by mouth daily with  breakfast.  . metoprolol tartrate (LOPRESSOR) 25 MG tablet Take 1 tablet (25 mg total) by mouth 2 (two) times daily.  Marland Kitchen omeprazole (PRILOSEC) 40 MG capsule Take 40 mg by mouth daily.  . [DISCONTINUED] budesonide (PULMICORT) 0.25 MG/2ML nebulizer solution Inhale 0.25 mLs into the lungs.  . Alum & Mag Hydroxide-Simeth (MAGIC MOUTHWASH) SOLN Take 5 mLs by mouth 3 (three) times daily as needed for mouth pain (thrush).

## 2014-10-25 NOTE — Assessment & Plan Note (Signed)
This has been a stable interval for her.  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  Plan: -Continue Advair and Spiriva (or a formulary alternative to her Advair is OK by me) -f/u 3 months

## 2014-10-29 ENCOUNTER — Telehealth: Payer: Self-pay

## 2014-10-29 DIAGNOSIS — J432 Centrilobular emphysema: Secondary | ICD-10-CM

## 2014-10-29 NOTE — Telephone Encounter (Signed)
-----   Message from Juanito Doom, MD sent at 10/25/2014  6:07 PM EDT ----- Caryl Pina,  Please let her know that her chest x-ray was okay.  I would like to get one more chest x-ray when she comes back in 3 months. Please order.  Thanks, Ruby Cola

## 2014-10-29 NOTE — Telephone Encounter (Signed)
lmtcb X1 for pt to relay results.  Follow up cxr already ordered.

## 2014-10-29 NOTE — Telephone Encounter (Signed)
Pt returned call. Informed her of the CXR results. Pt verbalized understanding and denied any further questions or concerns at this time.

## 2014-11-09 ENCOUNTER — Encounter: Payer: Self-pay | Admitting: Interventional Cardiology

## 2014-11-09 ENCOUNTER — Ambulatory Visit (INDEPENDENT_AMBULATORY_CARE_PROVIDER_SITE_OTHER): Payer: Medicare HMO | Admitting: Interventional Cardiology

## 2014-11-09 VITALS — BP 168/80 | HR 53 | Ht 61.0 in | Wt 114.0 lb

## 2014-11-09 DIAGNOSIS — I4891 Unspecified atrial fibrillation: Secondary | ICD-10-CM

## 2014-11-09 DIAGNOSIS — I10 Essential (primary) hypertension: Secondary | ICD-10-CM | POA: Diagnosis not present

## 2014-11-09 DIAGNOSIS — E785 Hyperlipidemia, unspecified: Secondary | ICD-10-CM

## 2014-11-09 DIAGNOSIS — E118 Type 2 diabetes mellitus with unspecified complications: Secondary | ICD-10-CM

## 2014-11-09 DIAGNOSIS — J9611 Chronic respiratory failure with hypoxia: Secondary | ICD-10-CM | POA: Diagnosis not present

## 2014-11-09 MED ORDER — ASPIRIN 81 MG PO TBEC: 81.0000 mg | DELAYED_RELEASE_TABLET | Freq: Every day | ORAL | Status: AC

## 2014-11-09 NOTE — Patient Instructions (Signed)
Your physician has recommended you make the following change in your medication:  1) RESTART Aspirin 81mg  daily  Your physician recommends that you schedule a follow-up appointment as needed

## 2014-11-09 NOTE — Progress Notes (Signed)
Cardiology Office Note   Date:  11/09/2014   ID:  Amanda [name], DOB 1941-01-26, MRN [id number]  PCP:  Tamsen Roers, MD  Cardiologist:   Sinclair Grooms, MD   No chief complaint on file.     History of Present Illness: Amanda [name] is a 74 y.o. female who presents for follow-up of paroxysmal atrial fibrillation. She is asymptomatic. She had atrial fibrillation do on a respiratory decompensation hospitalized. After recovery, on a 30 day monitor, she had at least one episode of prolonged atrial fibrillation lasting one hour and 40 minutes. She was asymptomatic.  She refuses anticoagulation therapy despite an elevated stroke risk (CHADS VASC:. She was encouraged to use aspirin and did initially. Over the past 3 weeks she has discontinued aspirin altogether because of bruising.    Past Medical History  Diagnosis Date  . Emphysema lung   . Hyperlipidemia   . GERD (gastroesophageal reflux disease)   . Vitamin D deficiency   . Closed fracture of unspecified part of upper end of humerus   . Complete rupture of rotator cuff     right shoulder- limited range of motion  . Other vitamin B12 deficiency anemia   . Neurogenic bladder, NOS     02-22-14 some bladder issues of urgency is somewhat improve.  Marland Kitchen COPD (chronic obstructive pulmonary disease)   . Type II or unspecified type diabetes mellitus without mention of complication, uncontrolled     diet controlled  . Essential hypertension, benign     off lisinporil for last 2 months  . Esophageal stricture 12/28/2013  . Cancer     colon cancer 01-12-14- no further tx. intended  . Arrhythmia     7-23-15x1beginning of colon surgery 01-12-14 - no problems since.    Past Surgical History  Procedure Laterality Date  . Orif shoulder fracture Right 2011    arthroplasty  . Tonsillectomy and adenoidectomy  age 25 or 48  . Eye surgery Bilateral 2010    both eyes lens replacments  . Esophagogastroduodenoscopy (egd) with propofol N/A 01/09/2014      Procedure: ESOPHAGOGASTRODUODENOSCOPY (EGD) WITH PROPOFOL;  Surgeon: [name], MD;  Location: [facility];  Service: Endoscopy;  Laterality: N/A;  . Colonoscopy N/A 01/10/2014    Procedure: COLONOSCOPY;  Surgeon: [name], MD;  Location: [facility];  Service: Endoscopy;  Laterality: N/A;  . Laparoscopic partial colectomy N/A 01/12/2014    Procedure: LAPAROSCOPIC ASSISTED PARTIAL COLECTOMY AND REMOVAL OF RECTAL POLYP;  Surgeon: Odis Hollingshead, MD;  Location: [facility];  Service: General;  Laterality: N/A;  . Esophagogastroduodenoscopy N/A 01/16/2014    Procedure: ESOPHAGOGASTRODUODENOSCOPY (EGD);  Surgeon: Gatha Mayer, MD;  Location: [facility];  Service: Endoscopy;  Laterality: N/A;  . Esophagogastroduodenoscopy (egd) with propofol N/A 01/19/2014    Procedure: ESOPHAGOGASTRODUODENOSCOPY (EGD) WITH PROPOFOL;  Surgeon: Gatha Mayer, MD;  Location: [facility];  Service: Endoscopy;  Laterality: N/A;  . Cataract extraction, bilateral Bilateral   . Dilation and curettage of uterus    . Esophagogastroduodenoscopy N/A 03/02/2014    Procedure: ESOPHAGOGASTRODUODENOSCOPY (EGD);  Surgeon: Gatha Mayer, MD;  Location: [facility];  Service: Endoscopy;  Laterality: N/A;  . Balloon dilation N/A 03/02/2014    Procedure: BALLOON DILATION;  Surgeon: Gatha Mayer, MD;  Location: [facility];  Service: Endoscopy;  Laterality: N/A;  . Colon surgery       Current Outpatient Prescriptions  Medication Sig Dispense Refill  . Alum & Mag Hydroxide-Simeth (MAGIC MOUTHWASH) SOLN Take 5  mLs by mouth 3 (three) times daily as needed for mouth pain (thrush). 140 mL 0  . aspirin EC 325 MG tablet Take 1 tablet (325 mg total) by mouth daily.    . ferrous sulfate 325 (65 FE) MG tablet Take 1 tablet (325 mg total) by mouth daily. 30 tablet 0  . Fluticasone-Salmeterol (ADVAIR DISKUS) 250-50 MCG/DOSE AEPB Inhale 1 puff into the lungs 2 (two) times daily. 60 each 5  . levalbuterol (XOPENEX HFA) 45  MCG/ACT inhaler Inhale 2 puffs into the lungs every 6 (six) hours as needed for wheezing or shortness of breath. 1 Inhaler 12  . levalbuterol (XOPENEX) 0.63 MG/3ML nebulizer solution Take 3 mLs (0.63 mg total) by nebulization every 6 (six) hours as needed for wheezing or shortness of breath. 3 mL 2  . metFORMIN (GLUCOPHAGE) 500 MG tablet Take 500 mg by mouth daily with breakfast.    . metoprolol tartrate (LOPRESSOR) 25 MG tablet Take 1 tablet (25 mg total) by mouth 2 (two) times daily. 60 tablet 1  . omeprazole (PRILOSEC) 40 MG capsule Take 40 mg by mouth daily.     No current facility-administered medications for this visit.    Allergies:   Review of patient's allergies indicates no known allergies.    Social History:  The patient  reports that she quit smoking about 9 years ago. Her smoking use included Cigarettes. She has a 20 pack-year smoking history. She has never used smokeless tobacco. She reports that she does not drink alcohol or use illicit drugs.   Family History:  The patient's family history includes Bone cancer in her father; Diabetes in her father; Emphysema in her mother; Pneumonia in her mother.    ROS:  Please see the history of present illness.   Otherwise, review of systems are positive for shortness of breath with activity. .   All other systems are reviewed and negative.    PHYSICAL EXAM: VS:  BP 168/80 mmHg  Pulse 53  Ht 5\' 1"  (1.549 m)  Wt 114 lb (51.71 kg)  BMI 21.55 kg/m2  SpO2 96% , BMI Body mass index is 21.55 kg/(m^2). GEN: Well nourished, well developed, in no acute distress HEENT: normal Neck: no JVD, carotid bruits, or masses Cardiac: RRR; no murmurs, rubs, or gallops,no edema  Respiratory:  clear to auscultation bilaterally, normal work of breathing GI: soft, nontender, nondistended, + BS MS: no deformity or atrophy Skin: warm and dry, no rash Neuro:  Strength and sensation are intact Psych: euthymic mood, full affect   EKG:  EKG is not ordered  today.    Recent Labs: 03/28/2014: Pro B Natriuretic peptide (BNP) 566.3* 03/29/2014: TSH 0.313* 08/07/2014: ALT 8; BUN 14.7; Creatinine 1.0; Hemoglobin 13.2; Platelets 298; Potassium 4.3; Sodium 139    Lipid Panel    Component Value Date/Time   CHOL  07/17/2010 0515    120        ATP III CLASSIFICATION:  <200     mg/dL   Desirable  200-239  mg/dL   Borderline High  >=240    mg/dL   High          TRIG 67 07/17/2010 0515   HDL 46 07/17/2010 0515   CHOLHDL 2.6 07/17/2010 0515   VLDL 13 07/17/2010 0515   [place]  07/17/2010 0515    61        Total Cholesterol/HDL:CHD Risk Coronary Heart Disease Risk Table  Men   Women  1/2 Average Risk   3.4   3.3  Average Risk       5.0   4.4  2 X Average Risk   9.6   7.1  3 X Average Risk  23.4   11.0        Use the calculated Patient Ratio above and the CHD Risk Table to determine the patient's CHD Risk.        ATP III CLASSIFICATION (LDL):  <100     mg/dL   Optimal  100-129  mg/dL   Near or Above                    Optimal  130-159  mg/dL   Borderline  160-189  mg/dL   High  >190     mg/dL   Very High      Wt Readings from Last 3 Encounters:  11/09/14 114 lb (51.71 kg)  10/25/14 113 lb (51.256 kg)  08/07/14 110 lb 4 oz (50.009 kg)      Other studies Reviewed: Additional studies/ records that were reviewed today include: Reviewed event monitor. Review of the above records demonsdiscuss results with patient  ASSESSMENT AND PLAN:  Atrial fibrillation with RVR: Documented to have asymptomatic atrial fibrillation by monitoring. She is refused anticoagulation. She is also now stopped taking aspirin.  Chronic respiratory failure with hypoxia: Stable  Hyperlipidemia: Followed by primary care  Essential hypertension, benign: Stable  Diabetes mellitus type 2 with complications     Current medicines are reviewed at length with the patient today.  The patient has concerns regarding medicines.  The  following changes have been made:  Complained of aspirin was causing bruising.  I again discussed stroke risk in this patient with a CHADS2-VASC score greater than 3. She has a five-year 30% stroke risk.. She still refuses anticoagulation therapy. She is willing to resume aspirin. Multiple questions were answered.  Labs/ tests ordered today include:  No orders of the defined types were placed in this encounter.    Prolonged office visit with the patient due to discussion around anticoagulation and stroke risk.   Disposition:   FU with Linard Millers as needed    Signed, Sinclair Grooms, MD  11/09/2014 10:55 AM    [place] [address], [place], [place]  [address] Phone: [phone]; Fax: [phone]

## 2014-11-28 ENCOUNTER — Telehealth: Payer: Self-pay | Admitting: Pulmonary Disease

## 2014-11-28 DIAGNOSIS — B370 Candidal stomatitis: Secondary | ICD-10-CM

## 2014-11-28 MED ORDER — MAGIC MOUTHWASH
5.0000 mL | Freq: Three times a day (TID) | ORAL | Status: DC | PRN
Start: 2014-11-28 — End: 2015-02-22

## 2014-11-28 NOTE — Telephone Encounter (Signed)
Patient calling requesting refill of Magic Mouthwash. Last refill was 10/25/14.  Ok to refill?

## 2014-11-28 NOTE — Telephone Encounter (Signed)
Refill sent to [facility].  Patient notified. Nothing further needed.

## 2014-11-28 NOTE — Telephone Encounter (Signed)
OK by me to refill

## 2014-11-29 ENCOUNTER — Telehealth: Payer: Self-pay | Admitting: Pulmonary Disease

## 2014-11-29 NOTE — Telephone Encounter (Signed)
Spoke with pt and advised that rx for MMW was called to pharmacy.

## 2014-12-24 IMAGING — CR DG CHEST 1V PORT
1 series · 1 of 1 positions shown · non-contrast
Comparison: 01/14/2014.

CLINICAL DATA: Shortness of breath and COPD.

EXAM:
PORTABLE CHEST - 1 VIEW

[AP]
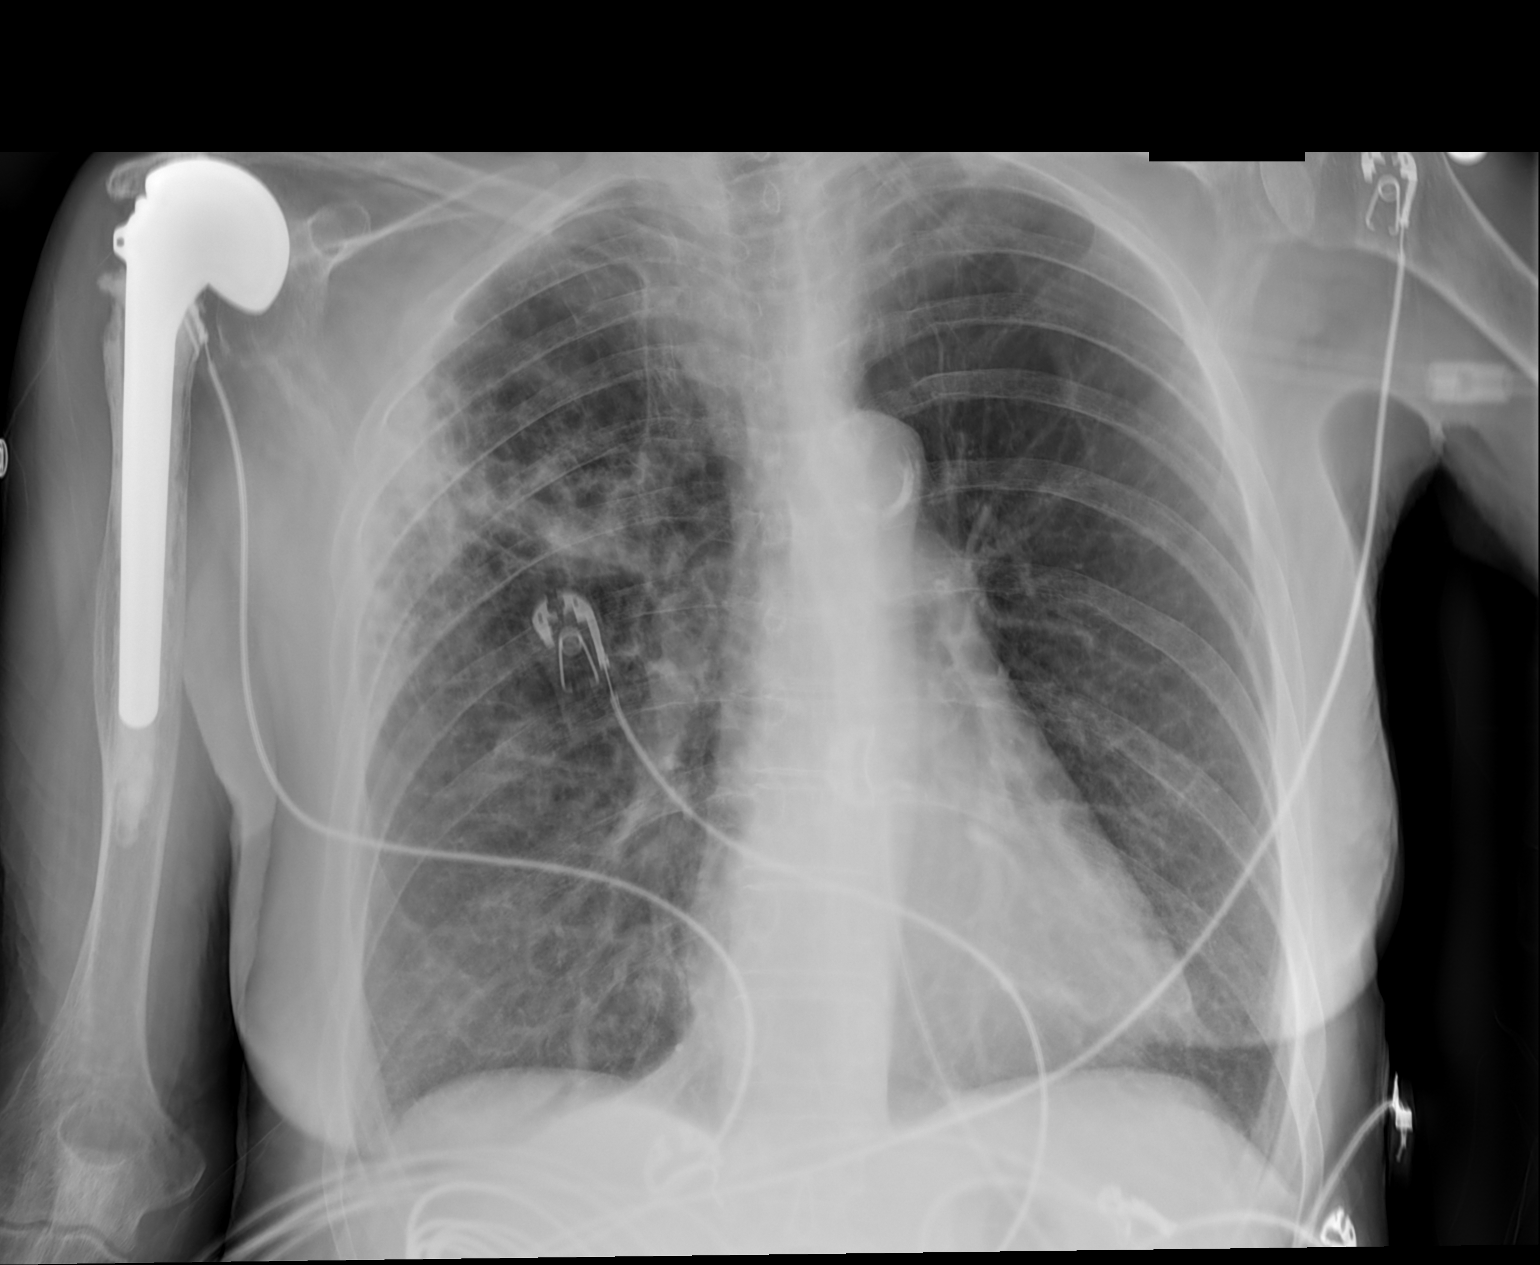

[1 of 1 positions shown; findings below may reference images not displayed]

FINDINGS: 1313 hrs. Interval development of right upper lobe airspace disease,
compatible with pneumonia. Left lung is clear. No evidence for
pleural effusion. The cardiopericardial silhouette is within normal
limits for size. Telemetry leads overlie the chest. Patient is
status post right shoulder replacement.
IMPRESSION: Airspace disease in the right upper lobe compatible with pneumonia.
Followup imaging is recommended to ensure resolution.

## 2015-02-01 ENCOUNTER — Telehealth: Payer: Self-pay | Admitting: Oncology

## 2015-02-01 NOTE — Telephone Encounter (Signed)
s.w. pt and advised on JULY appts....pt ok and aware

## 2015-02-05 ENCOUNTER — Ambulatory Visit (HOSPITAL_COMMUNITY): Payer: Medicare HMO

## 2015-02-05 ENCOUNTER — Other Ambulatory Visit: Payer: Medicare HMO

## 2015-02-07 ENCOUNTER — Ambulatory Visit: Payer: Medicare HMO | Admitting: Oncology

## 2015-02-12 ENCOUNTER — Ambulatory Visit (HOSPITAL_COMMUNITY): Payer: Medicare HMO

## 2015-02-15 ENCOUNTER — Ambulatory Visit (HOSPITAL_COMMUNITY)
Admission: RE | Admit: 2015-02-15 | Discharge: 2015-02-15 | Disposition: A | Discharge status: Home / Self Care | Payer: Medicare HMO | Source: Ambulatory Visit | Attending: Oncology | Admitting: Oncology

## 2015-02-15 ENCOUNTER — Other Ambulatory Visit (HOSPITAL_BASED_OUTPATIENT_CLINIC_OR_DEPARTMENT_OTHER): Payer: Medicare HMO

## 2015-02-15 ENCOUNTER — Encounter (HOSPITAL_COMMUNITY): Payer: Self-pay

## 2015-02-15 DIAGNOSIS — C189 Malignant neoplasm of colon, unspecified: Secondary | ICD-10-CM | POA: Diagnosis not present

## 2015-02-15 DIAGNOSIS — K439 Ventral hernia without obstruction or gangrene: Secondary | ICD-10-CM | POA: Insufficient documentation

## 2015-02-15 DIAGNOSIS — C180 Malignant neoplasm of cecum: Secondary | ICD-10-CM | POA: Diagnosis not present

## 2015-02-15 LAB — CBC WITH DIFFERENTIAL/PLATELET
BASO%: 0.9 % (ref 0.0–2.0)
BASOS ABS: 0.1 10*3/uL (ref 0.0–0.1)
EOS%: 3 % (ref 0.0–7.0)
Eosinophils Absolute: 0.3 10*3/uL (ref 0.0–0.5)
HCT: 40.8 % (ref 34.8–46.6)
HEMOGLOBIN: 13.4 g/dL (ref 11.6–15.9)
LYMPH%: 18.7 % (ref 14.0–49.7)
MCH: 29.5 pg (ref 25.1–34.0)
MCHC: 32.9 g/dL (ref 31.5–36.0)
MCV: 89.7 fL (ref 79.5–101.0)
MONO#: 0.6 10*3/uL (ref 0.1–0.9)
MONO%: 7.3 % (ref 0.0–14.0)
NEUT#: 6.1 10*3/uL (ref 1.5–6.5)
NEUT%: 70.1 % (ref 38.4–76.8)
Platelets: 333 10*3/uL (ref 145–400)
RBC: 4.55 10*6/uL (ref 3.70–5.45)
RDW: 13.2 % (ref 11.2–14.5)
WBC: 8.7 10*3/uL (ref 3.9–10.3)
lymph#: 1.6 10*3/uL (ref 0.9–3.3)

## 2015-02-15 LAB — COMPREHENSIVE METABOLIC PANEL (CC13)
ALBUMIN: 4.1 g/dL (ref 3.5–5.0)
ALT: 7 U/L (ref 0–55)
ANION GAP: 7 meq/L (ref 3–11)
AST: 18 U/L (ref 5–34)
Alkaline Phosphatase: 73 U/L (ref 40–150)
BILIRUBIN TOTAL: 0.46 mg/dL (ref 0.20–1.20)
BUN: 13.7 mg/dL (ref 7.0–26.0)
CO2: 31 meq/L — AB (ref 22–29)
Calcium: 10.6 mg/dL — ABNORMAL HIGH (ref 8.4–10.4)
Chloride: 98 mEq/L (ref 98–109)
Creatinine: 1 mg/dL (ref 0.6–1.1)
EGFR: 56 mL/min/{1.73_m2} — ABNORMAL LOW (ref >90)
Glucose: 129 mg/dl (ref 70–140)
POTASSIUM: 5.4 meq/L — AB (ref 3.5–5.1)
SODIUM: 136 meq/L (ref 136–145)
Total Protein: 7.4 g/dL (ref 6.4–8.3)

## 2015-02-15 MED ORDER — IOHEXOL 300 MG/ML  SOLN
100.0000 mL | Freq: Once | INTRAMUSCULAR | Status: AC | PRN
  Administered 2015-02-15: 100 mL via INTRAVENOUS

## 2015-02-15 MED ORDER — IOHEXOL 300 MG/ML  SOLN
50.0000 mL | Freq: Once | INTRAMUSCULAR | Status: AC | PRN
  Administered 2015-02-15: 50 mL via ORAL

## 2015-02-16 LAB — CEA: CEA: 1.2 ng/mL (ref 0.0–5.0)

## 2015-02-19 ENCOUNTER — Encounter: Payer: Self-pay | Admitting: Internal Medicine

## 2015-02-21 ENCOUNTER — Telehealth: Payer: Self-pay | Admitting: Oncology

## 2015-02-21 ENCOUNTER — Other Ambulatory Visit: Payer: Medicare HMO

## 2015-02-21 ENCOUNTER — Ambulatory Visit: Payer: Medicare HMO | Admitting: Physician Assistant

## 2015-02-21 NOTE — Telephone Encounter (Signed)
Patient returned my call re cancelling f/u today due to [place] out. Gave patient new appointment for 8/5 @ 3:30 pm with FS. Appointment originally should have been with FS. Date per pt due to she requests a Friday.

## 2015-02-22 ENCOUNTER — Encounter: Payer: Self-pay | Admitting: Pulmonary Disease

## 2015-02-22 ENCOUNTER — Ambulatory Visit (INDEPENDENT_AMBULATORY_CARE_PROVIDER_SITE_OTHER): Payer: Medicare HMO | Admitting: Pulmonary Disease

## 2015-02-22 ENCOUNTER — Telehealth: Payer: Self-pay | Admitting: Pulmonary Disease

## 2015-02-22 VITALS — BP 142/70 | HR 59 | Ht 61.0 in | Wt 113.2 lb

## 2015-02-22 DIAGNOSIS — J189 Pneumonia, unspecified organism: Secondary | ICD-10-CM | POA: Diagnosis not present

## 2015-02-22 DIAGNOSIS — J432 Centrilobular emphysema: Secondary | ICD-10-CM | POA: Diagnosis not present

## 2015-02-22 DIAGNOSIS — B370 Candidal stomatitis: Secondary | ICD-10-CM

## 2015-02-22 MED ORDER — LEVALBUTEROL TARTRATE 45 MCG/ACT IN AERO: 2.0000 | INHALATION_SPRAY | Freq: Four times a day (QID) | RESPIRATORY_TRACT | Status: DC | PRN

## 2015-02-22 MED ORDER — MAGIC MOUTHWASH
5.0000 mL | Freq: Three times a day (TID) | ORAL | Status: DC | PRN
Start: 2015-02-22 — End: 2015-07-12

## 2015-02-22 NOTE — Patient Instructions (Signed)
Keep taking your medications as you're doing Get a flu shot in the fall Walk as much as she can every day We will see you back in 6 months or sooner if needed

## 2015-02-22 NOTE — Addendum Note (Signed)
Addended by: Levander Campion on: 02/22/2015 01:47 PM   Modules accepted: Orders

## 2015-02-22 NOTE — Telephone Encounter (Signed)
Patient called and said that pharmacy did not receive the rx for mouthwash, only the inhaler.  Patient's husband was angry because the medication was not there. Patients husband picked up the inhaler and left the store and called our office to complain.  I called the pharmacy and called in the mouthwash since they said the prescription did not go through.  Notified patients husband that the medication has been called in and he can go back and pick it up. Nothing further needed.

## 2015-02-22 NOTE — Assessment & Plan Note (Signed)
This has been a stable interval for her.   Her immunizations are up-to-date  Plan: Continue Advair Open and says needed Remain active

## 2015-02-22 NOTE — Assessment & Plan Note (Signed)
resolved 

## 2015-02-22 NOTE — Progress Notes (Signed)
Subjective:    Patient ID: Amanda [name], female    DOB: July 18, 1941, 74 y.o.   MRN: [id number]  Synopsis> likely COPD, first seen by [facility] 2015. Also diagnosed with stage I colon cancer requiring a partial colectomy in 2015.  HPI Chief Complaint  Patient presents with  . Follow-up    Pt states that she if feeling ok as long as she is using her inhaler. Pt states that she feels the hot weather is making her more SOB   Jake says that the summer has been OK for her so far this year.  No bad problems with her breathing recently.  She was supposed to see Dr. Osker Mason yesterday but the appointment was rescheduled. She remains dyspneic with activity, but she remains fairly active with walking.  She can run a vacuum cleaner without too much difficulty. She has not used the Xopenex but is still using the Advair regularly.  Past Medical History  Diagnosis Date  . Emphysema lung   . Hyperlipidemia   . GERD (gastroesophageal reflux disease)   . Vitamin D deficiency   . Closed fracture of unspecified part of upper end of humerus   . Complete rupture of rotator cuff     right shoulder- limited range of motion  . Other vitamin B12 deficiency anemia   . Neurogenic bladder, NOS     02-22-14 some bladder issues of urgency is somewhat improve.  Marland Kitchen COPD (chronic obstructive pulmonary disease)   . Type II or unspecified type diabetes mellitus without mention of complication, uncontrolled     diet controlled  . Essential hypertension, benign     off lisinporil for last 2 months  . Esophageal stricture 12/28/2013  . Arrhythmia     7-23-15x1beginning of colon surgery 01-12-14 - no problems since.  . Cancer     colon cancer 01-12-14- no further tx. intended     Review of Systems  Constitutional: Negative for fever, chills and fatigue.  HENT: Negative for postnasal drip, rhinorrhea and sinus pressure.   Respiratory: Negative for cough, shortness of breath and wheezing.   Cardiovascular:  Negative for chest pain, palpitations and leg swelling.       Objective:   Physical Exam Filed Vitals:   02/22/15 1330  BP: 142/70  Pulse: 59  Height: 5\' 1"  (1.549 m)  Weight: 113 lb 3.2 oz (51.347 kg)  SpO2: 95%  RA   Gen: well appearing, no acute distress HEENT: NCAT, OP Clear,  PULM: Very faint end expiratory wheeze, good air movement CV: Irreg irreg, slight systolic murmur, no JVD AB: BS+, soft, nontender, no hsm Ext: warm, no edema, no clubbing, no cyanosis  July 2016 CT chest abdomen and pelvis shows resolution of pneumonia, no evidence of recurrent colon cancer     Assessment & Plan:   COPD (chronic obstructive pulmonary disease) This has been a stable interval for her.   Her immunizations are up-to-date  Plan: Continue Advair Open and says needed Remain active    HCAP (healthcare-associated pneumonia) resolved    Updated Medication List Outpatient Encounter Prescriptions as of 02/22/2015  Medication Sig  . Alum & Mag Hydroxide-Simeth (MAGIC MOUTHWASH) SOLN Take 5 mLs by mouth 3 (three) times daily as needed for mouth pain (thrush).  Marland Kitchen aspirin EC 81 MG EC tablet Take 1 tablet (81 mg total) by mouth daily.  . ferrous sulfate 325 (65 FE) MG tablet Take 1 tablet (325 mg total) by mouth daily.  . Fluticasone-Salmeterol (ADVAIR DISKUS) 250-50  MCG/DOSE AEPB Inhale 1 puff into the lungs 2 (two) times daily.  Marland Kitchen levalbuterol (XOPENEX HFA) 45 MCG/ACT inhaler Inhale 2 puffs into the lungs every 6 (six) hours as needed for wheezing or shortness of breath.  . metFORMIN (GLUCOPHAGE) 500 MG tablet Take 500 mg by mouth daily with breakfast.  . metoprolol tartrate (LOPRESSOR) 25 MG tablet Take 1 tablet (25 mg total) by mouth 2 (two) times daily.  Marland Kitchen omeprazole (PRILOSEC) 40 MG capsule Take 40 mg by mouth daily.  Marland Kitchen levalbuterol (XOPENEX) 0.63 MG/3ML nebulizer solution Take 3 mLs (0.63 mg total) by nebulization every 6 (six) hours as needed for wheezing or shortness of breath.  (Patient not taking: Reported on 02/22/2015)   No facility-administered encounter medications on file as of 02/22/2015.

## 2015-02-26 ENCOUNTER — Telehealth: Payer: Self-pay | Admitting: *Deleted

## 2015-02-26 NOTE — Telephone Encounter (Signed)
Patient called stating that she would like to cancel appointment. Patient did not give a reason, but will call back and reschedule appointment if appropriate. Sent POF to scheduler

## 2015-03-07 ENCOUNTER — Encounter: Payer: Self-pay | Admitting: Internal Medicine

## 2015-03-08 ENCOUNTER — Ambulatory Visit: Payer: Medicare HMO | Admitting: Oncology

## 2015-05-01 ENCOUNTER — Ambulatory Visit (AMBULATORY_SURGERY_CENTER): Payer: Self-pay | Admitting: *Deleted

## 2015-05-01 VITALS — Ht 61.0 in | Wt 114.8 lb

## 2015-05-01 DIAGNOSIS — Z85038 Personal history of other malignant neoplasm of large intestine: Secondary | ICD-10-CM

## 2015-05-01 NOTE — Progress Notes (Signed)
Denies allergies to eggs or soy products. Denies complications with sedation or anesthesia. Denies O2 use. Denies use of diet or weight loss medications.  Emmi instructions not given for colonoscopy. No access to internet.  

## 2015-05-15 ENCOUNTER — Ambulatory Visit (AMBULATORY_SURGERY_CENTER): Payer: Medicare HMO | Admitting: Internal Medicine

## 2015-05-15 ENCOUNTER — Encounter: Payer: Self-pay | Admitting: Internal Medicine

## 2015-05-15 VITALS — BP 122/73 | HR 50 | Temp 96.8°F | Resp 20 | Ht 61.0 in | Wt 114.0 lb

## 2015-05-15 DIAGNOSIS — D123 Benign neoplasm of transverse colon: Secondary | ICD-10-CM

## 2015-05-15 DIAGNOSIS — Z86010 Personal history of colonic polyps: Secondary | ICD-10-CM | POA: Diagnosis not present

## 2015-05-15 DIAGNOSIS — Z85038 Personal history of other malignant neoplasm of large intestine: Secondary | ICD-10-CM | POA: Diagnosis not present

## 2015-05-15 DIAGNOSIS — Z860101 Personal history of adenomatous and serrated colon polyps: Secondary | ICD-10-CM

## 2015-05-15 HISTORY — DX: Personal history of colonic polyps: Z86.010

## 2015-05-15 HISTORY — DX: Personal history of adenomatous and serrated colon polyps: Z86.0101

## 2015-05-15 LAB — GLUCOSE, CAPILLARY
GLUCOSE-CAPILLARY: 140 mg/dL — AB (ref 65–99)
Glucose-Capillary: 149 mg/dL — ABNORMAL HIGH (ref 65–99)

## 2015-05-15 MED ORDER — SODIUM CHLORIDE 0.9 % IV SOLN: 500.0000 mL | INTRAVENOUS | Status: DC

## 2015-05-15 NOTE — Progress Notes (Signed)
Called to room to assist during endoscopic procedure.  Patient ID and intended procedure confirmed with present staff. Received instructions for my participation in the procedure from the performing physician.  

## 2015-05-15 NOTE — Progress Notes (Signed)
A/ox3 pleased with MAC, report to Penny RN 

## 2015-05-15 NOTE — Op Note (Addendum)
[place]  [address]. [place], [address]   COLONOSCOPY PROCEDURE REPORT  PATIENT: Amanda [name], Amanda [name]  MR#: [id number] BIRTHDATE: Mar 15, 1941 , 17  yrs. old GENDER: female ENDOSCOPIST: Gatha Mayer, MD, [facility] PROCEDURE DATE:  05/15/2015 PROCEDURE:   Colonoscopy, surveillance , Colonoscopy with biopsy, and Colonoscopy with snare polypectomy First Screening Colonoscopy - Avg.  risk and is 50 yrs.  old or older - No.  Prior Negative Screening - Now for repeat screening. N/A  History of Adenoma - Now for follow-up colonoscopy & has been > or = to 3 yrs.  Yes hx of adenoma.  Has been 3 or more years since last colonoscopy.  Polyps removed today? Yes ASA CLASS:   Class III INDICATIONS:Surveillance due to prior colonic neoplasia, PH Colon or Rectal Adenocarcinoma, and PH Colon Adenoma. MEDICATIONS: Propofol 100 mg IV and Monitored anesthesia care  DESCRIPTION OF PROCEDURE:   After the risks benefits and alternatives of the procedure were thoroughly explained, informed consent was obtained.  The digital rectal exam revealed no rectal mass and revealed several skin tags.   The [id number] endoscope was introduced through the anus and advanced to the surgical anastomosis. No adverse events experienced.   The quality of the prep was good.  (MiraLax was used)  The instrument was then slowly withdrawn as the colon was fully examined. Estimated blood loss is zero unless otherwise noted in this procedure report.      COLON FINDINGS: Two polypoid shaped sessile polyps ranging from 2 to 5mm in size were found in the transverse colon.  Polypectomies were performed with cold forceps and with a cold snare.  The resection was complete, the polyp tissue was completely retrieved and sent to histology.   There was evidence of a prior ileocolonic surgical anastomosis in the transverse colon.   The examination was otherwise normal.  Retroflexed views revealed no abnormalities.  The time to cecum = 13.0 Withdrawal time = 8.4   The scope was withdrawn and the procedure completed. COMPLICATIONS: There were no immediate complications.  ENDOSCOPIC IMPRESSION: 1.   Two sessile polyps ranging from 2 to 96mm in size were found in the transverse colon; polypectomies were performed with cold forceps and with a cold snare 2.   There was evidence of a prior ileocolonic surgical anastomosis in the transverse colon 3.   The examination was otherwise normal  RECOMMENDATIONS: Timing of repeat colonoscopy will be determined by pathology findings.  Likely 3 yrs . having recurrent dysphagia - she will have previsit and EGD appt made by [facility] staff today  eSigned:  Gatha Mayer, MD, [facility] 05/15/2015 9:46 AM Revised: 05/15/2015 9:46 AM  cc: The Patient and Tamsen Roers, MD

## 2015-05-15 NOTE — Patient Instructions (Signed)
YOU HAD AN ENDOSCOPIC PROCEDURE TODAY AT [facility]:   Refer to the procedure report that was given to you for any specific questions about what was found during the examination.  If the procedure report does not answer your questions, please call your gastroenterologist to clarify.  If you requested that your care partner not be given the details of your procedure findings, then the procedure report has been included in a sealed envelope for you to review at your convenience later.  YOU SHOULD EXPECT: Some feelings of bloating in the abdomen. Passage of more gas than usual.  Walking can help get rid of the air that was put into your GI tract during the procedure and reduce the bloating. If you had a lower endoscopy (such as a colonoscopy or flexible sigmoidoscopy) you may notice spotting of blood in your stool or on the toilet paper. If you underwent a bowel prep for your procedure, you may not have a normal bowel movement for a few days.  Please Note:  You might notice some irritation and congestion in your nose or some drainage.  This is from the oxygen used during your procedure.  There is no need for concern and it should clear up in a day or so.  SYMPTOMS TO REPORT IMMEDIATELY:   Following lower endoscopy (colonoscopy or flexible sigmoidoscopy):  Excessive amounts of blood in the stool  Significant tenderness or worsening of abdominal pains  Swelling of the abdomen that is new, acute  Fever of 100F or higher    Black, tarry-looking stools  For urgent or emergent issues, a gastroenterologist can be reached at any hour by calling [phone].   DIET: Your first meal following the procedure should be a small meal and then it is ok to progress to your normal diet. Heavy or fried foods are harder to digest and may make you feel nauseous or bloated.  Likewise, meals heavy in dairy and vegetables can increase bloating.  Drink plenty of fluids but you should avoid alcoholic  beverages for 24 hours.  ACTIVITY:  You should plan to take it easy for the rest of today and you should NOT DRIVE or use heavy machinery until tomorrow (because of the sedation medicines used during the test).    FOLLOW UP: Our staff will call the number listed on your records the next business day following your procedure to check on you and address any questions or concerns that you may have regarding the information given to you following your procedure. If we do not reach you, we will leave a message.  However, if you are feeling well and you are not experiencing any problems, there is no need to return our call.  We will assume that you have returned to your regular daily activities without incident.  If any biopsies were taken you will be contacted by phone or by letter within the next 1-3 weeks.  Please call us at [phone] if you have not heard about the biopsies in 3 weeks.    SIGNATURES/CONFIDENTIALITY: You and/or your care partner have signed paperwork which will be entered into your electronic medical record.  These signatures attest to the fact that that the information above on your After Visit Summary has been reviewed and is understood.  Full responsibility of the confidentiality of this discharge information lies with you and/or your care-partner.   INFORMATION ON POLYPS GIVEN TO YOU TODAY   UPPER ENDOSCOPY AND NURSE VISIT FOR INSTRUCTIONS SCHEDULED (SEE  TOP OF THIS SUMMARY)

## 2015-05-16 ENCOUNTER — Telehealth: Payer: Self-pay | Admitting: *Deleted

## 2015-05-16 NOTE — Telephone Encounter (Signed)
Message left

## 2015-05-23 ENCOUNTER — Encounter: Payer: Self-pay | Admitting: Internal Medicine

## 2015-05-23 DIAGNOSIS — Z86010 Personal history of colonic polyps: Secondary | ICD-10-CM

## 2015-05-23 NOTE — Progress Notes (Signed)
Quick Note:  2 diminutive adenomas Repeat colonoscopy 2019 given this and PHx CRCA ______

## 2015-07-05 ENCOUNTER — Ambulatory Visit (AMBULATORY_SURGERY_CENTER): Payer: Self-pay | Admitting: *Deleted

## 2015-07-05 VITALS — Ht 61.0 in | Wt 113.0 lb

## 2015-07-05 DIAGNOSIS — R1310 Dysphagia, unspecified: Secondary | ICD-10-CM

## 2015-07-05 NOTE — Progress Notes (Signed)
No egg or soy allergy. No anesthesia problems.  No home O2.  No diet meds.  

## 2015-07-08 ENCOUNTER — Encounter: Payer: Self-pay | Admitting: Internal Medicine

## 2015-07-12 ENCOUNTER — Encounter: Payer: Self-pay | Admitting: Internal Medicine

## 2015-07-12 ENCOUNTER — Ambulatory Visit (AMBULATORY_SURGERY_CENTER): Payer: Medicare HMO | Admitting: Internal Medicine

## 2015-07-12 VITALS — BP 156/73 | HR 96 | Temp 97.7°F | Resp 17 | Ht 61.0 in | Wt 113.0 lb

## 2015-07-12 DIAGNOSIS — R1310 Dysphagia, unspecified: Secondary | ICD-10-CM | POA: Diagnosis present

## 2015-07-12 DIAGNOSIS — K222 Esophageal obstruction: Secondary | ICD-10-CM

## 2015-07-12 LAB — GLUCOSE, CAPILLARY
GLUCOSE-CAPILLARY: 125 mg/dL — AB (ref 65–99)
GLUCOSE-CAPILLARY: 98 mg/dL (ref 65–99)

## 2015-07-12 MED ORDER — OMEPRAZOLE 40 MG PO CPDR: 40.0000 mg | DELAYED_RELEASE_CAPSULE | Freq: Two times a day (BID) | ORAL | Status: DC

## 2015-07-12 MED ORDER — SODIUM CHLORIDE 0.9 % IV SOLN: 500.0000 mL | INTRAVENOUS | Status: DC

## 2015-07-12 NOTE — Op Note (Signed)
[place]  [address] [place], [address]   ENDOSCOPY PROCEDURE REPORT  PATIENT: Amanda, [name]  MR#: [id number] BIRTHDATE: [birth year] , 85  yrs. old GENDER: female ENDOSCOPIST: Gatha Mayer, MD, [facility] PROCEDURE DATE:  07/12/2015 PROCEDURE:  Esophagoscopy   + balloon dilation < 30 mm ASA CLASS:     Class III INDICATIONS:  therapeutic procedure and dysphagia. MEDICATIONS: Propofol 100 mg IV and Monitored anesthesia care TOPICAL ANESTHETIC: none  DESCRIPTION OF PROCEDURE: After the risks benefits and alternatives of the procedure were thoroughly explained, informed consent was obtained.  The LB [id number] endoscope was introduced through the mouth and advanced to the stomach antrum , Without limitations.  The instrument was slowly withdrawn as the mucosa was fully examined.    Two strictures in esophagus - proximal just belwo UES and distal at GE junction.  Both dilated to 13 mm w/ 11,12,13 mm balloon - good results.  Scope would not pass until dilated to 13 mm.  Slightly tortuous esophagus otherwise, normal stomach.  Retroflexed views revealed no abnormalities.     The scope was then withdrawn from the patient and the procedure completed.  COMPLICATIONS: There were no immediate complications.  ENDOSCOPIC IMPRESSION: Two strictures in esophagus - proximal just belwo UES and distal at GE junction.  Both dilated to 13 mm w/ 11,12,13 mm balloon - good results.  Scope would not pass until dilated to 13 mm.  Slightly tortuous esophagus otherwise, normal stomach  RECOMMENDATIONS: 1.  Clear liquids until 5 PM , then soft foods rest of day.  Resume prior diet tomorrow. Dysphagia 3 2.  Increase omeprazole to 40 mg bid from qd 3.  Repeat EGD/dilation 12/22 1100 - consider bxs then, ? if this is not from GERD but possibly a connective tissue process   eSigned:  Gatha Mayer, MD, [facility] 07/12/2015 3:49 PM   CC: Dr. Tamsen Roers and The  Patient

## 2015-07-12 NOTE — Patient Instructions (Addendum)
I dilated the esophagus again today - this time 2 strictures were dilated one at the beginning and one at the end of the esophagus.  I think i should have you come back again and would like to repeat the dilations on Dec 22 at 11 AM.  I have sent a new prescription for the omeprazole to take it twice a day.  I appreciate the opportunity to care for you. Gatha Mayer, MD, FACG  YOU HAD AN ENDOSCOPIC PROCEDURE TODAY AT [facility]:   Refer to the procedure report that was given to you for any specific questions about what was found during the examination.  If the procedure report does not answer your questions, please call your gastroenterologist to clarify.  If you requested that your care partner not be given the details of your procedure findings, then the procedure report has been included in a sealed envelope for you to review at your convenience later.  YOU SHOULD EXPECT: Some feelings of bloating in the abdomen. Passage of more gas than usual.  Walking can help get rid of the air that was put into your GI tract during the procedure and reduce the bloating. If you had a lower endoscopy (such as a colonoscopy or flexible sigmoidoscopy) you may notice spotting of blood in your stool or on the toilet paper. If you underwent a bowel prep for your procedure, you may not have a normal bowel movement for a few days.  Please Note:  You might notice some irritation and congestion in your nose or some drainage.  This is from the oxygen used during your procedure.  There is no need for concern and it should clear up in a day or so.  SYMPTOMS TO REPORT IMMEDIATELY:   Following upper endoscopy (EGD)  Vomiting of blood or coffee ground material  New chest pain or pain under the shoulder blades  Painful or persistently difficult swallowing  New shortness of breath  Fever of 100F or higher  Black, tarry-looking stools  For urgent or emergent issues, a gastroenterologist  can be reached at any hour by calling [phone].   DIET: Your first meal following the procedure should be a small meal and then it is ok to progress to your normal diet. Heavy or fried foods are harder to digest and may make you feel nauseous or bloated.  Likewise, meals heavy in dairy and vegetables can increase bloating.  Drink plenty of fluids but you should avoid alcoholic beverages for 24 hours.  ACTIVITY:  You should plan to take it easy for the rest of today and you should NOT DRIVE or use heavy machinery until tomorrow (because of the sedation medicines used during the test).    FOLLOW UP: Our staff will call the number listed on your records the next business day following your procedure to check on you and address any questions or concerns that you may have regarding the information given to you following your procedure. If we do not reach you, we will leave a message.  However, if you are feeling well and you are not experiencing any problems, there is no need to return our call.  We will assume that you have returned to your regular daily activities without incident.  If any biopsies were taken you will be contacted by phone or by letter within the next 1-3 weeks.  Please call us at [phone] if you have not heard about the biopsies in 3 weeks.  SIGNATURES/CONFIDENTIALITY: You and/or your care partner have signed paperwork which will be entered into your electronic medical record.  These signatures attest to the fact that that the information above on your After Visit Summary has been reviewed and is understood.  Full responsibility of the confidentiality of this discharge information lies with you and/or your care-partner.  Dilation diet instructions given, please review handout provided, along with Dysphagia diet 3 instructions. EGD scheduled for December 22nd. Instructions given. Omeprazole increased to 40 mg twice daily.

## 2015-07-12 NOTE — Progress Notes (Signed)
Stable to RR 

## 2015-07-12 NOTE — Progress Notes (Signed)
Called to room to assist during endoscopic procedure.  Patient ID and intended procedure confirmed with present staff. Received instructions for my participation in the procedure from the performing physician.  

## 2015-07-15 ENCOUNTER — Telehealth: Payer: Self-pay

## 2015-07-15 NOTE — Telephone Encounter (Signed)
  Follow up Call-  Call back number 07/12/2015 05/15/2015  Post procedure Call Back phone  # [phone]  Permission to leave phone message Yes Yes     Patient questions:  Do you have a fever, pain , or abdominal swelling? No. Pain Score  0 *  Have you tolerated food without any problems? Yes.    Have you been able to return to your normal activities? Yes.    Do you have any questions about your discharge instructions: Diet   No. Medications  No. Follow up visit  No.  Do you have questions or concerns about your Care? No.  Actions: * If pain score is 4 or above: No action needed, pain <4.

## 2015-07-25 ENCOUNTER — Ambulatory Visit (AMBULATORY_SURGERY_CENTER): Payer: Medicare HMO | Admitting: Internal Medicine

## 2015-07-25 ENCOUNTER — Encounter: Payer: Self-pay | Admitting: Internal Medicine

## 2015-07-25 VITALS — BP 143/59 | HR 51 | Temp 97.6°F | Resp 17 | Ht 61.0 in | Wt 113.0 lb

## 2015-07-25 DIAGNOSIS — K209 Esophagitis, unspecified: Secondary | ICD-10-CM | POA: Diagnosis not present

## 2015-07-25 DIAGNOSIS — K222 Esophageal obstruction: Secondary | ICD-10-CM | POA: Diagnosis not present

## 2015-07-25 DIAGNOSIS — R1310 Dysphagia, unspecified: Secondary | ICD-10-CM | POA: Diagnosis not present

## 2015-07-25 LAB — GLUCOSE, CAPILLARY
Glucose-Capillary: 123 mg/dL — ABNORMAL HIGH (ref 65–99)
Glucose-Capillary: 124 mg/dL — ABNORMAL HIGH (ref 65–99)

## 2015-07-25 MED ORDER — SODIUM CHLORIDE 0.9 % IV SOLN: 500.0000 mL | INTRAVENOUS | Status: DC

## 2015-07-25 NOTE — Progress Notes (Signed)
Report to PACU, RN, vss, BBS= Clear.  

## 2015-07-25 NOTE — Patient Instructions (Addendum)
There were narrow areas in the esophagus that looked about the same as before. i dilated them again. Not sure what is causing this but do not think its cancer.  I took biopsies and will get in touch with recommendations.  I appreciate the opportunity to care for you. Gatha Mayer, MD, FACG   YOU HAD AN ENDOSCOPIC PROCEDURE TODAY AT [facility]:   Refer to the procedure report that was given to you for any specific questions about what was found during the examination.  If the procedure report does not answer your questions, please call your gastroenterologist to clarify.  If you requested that your care partner not be given the details of your procedure findings, then the procedure report has been included in a sealed envelope for you to review at your convenience later.  YOU SHOULD EXPECT: Some feelings of bloating in the abdomen. Passage of more gas than usual.  Walking can help get rid of the air that was put into your GI tract during the procedure and reduce the bloating. If you had a lower endoscopy (such as a colonoscopy or flexible sigmoidoscopy) you may notice spotting of blood in your stool or on the toilet paper. If you underwent a bowel prep for your procedure, you may not have a normal bowel movement for a few days.  Please Note:  You might notice some irritation and congestion in your nose or some drainage.  This is from the oxygen used during your procedure.  There is no need for concern and it should clear up in a day or so.  SYMPTOMS TO REPORT IMMEDIATELY:   Following lower endoscopy (colonoscopy or flexible sigmoidoscopy):  Excessive amounts of blood in the stool  Significant tenderness or worsening of abdominal pains  Swelling of the abdomen that is new, acute  Fever of 100F or higher   Following upper endoscopy (EGD)  Vomiting of blood or coffee ground material  New chest pain or pain under the shoulder blades  Painful or persistently  difficult swallowing  New shortness of breath  Fever of 100F or higher  Black, tarry-looking stools  For urgent or emergent issues, a gastroenterologist can be reached at any hour by calling [phone].   DIET:  TODAY YOU SHOULD  FOLLOW DILATATION DIET GIVEN TO YOU TODAY   ACTIVITY:  You should plan to take it easy for the rest of today and you should NOT DRIVE or use heavy machinery until tomorrow (because of the sedation medicines used during the test).    FOLLOW UP: Our staff will call the number listed on your records the next business day following your procedure to check on you and address any questions or concerns that you may have regarding the information given to you following your procedure. If we do not reach you, we will leave a message.  However, if you are feeling well and you are not experiencing any problems, there is no need to return our call.  We will assume that you have returned to your regular daily activities without incident.  If any biopsies were taken you will be contacted by phone or by letter within the next 1-3 weeks.  Please call us at [phone] if you have not heard about the biopsies in 3 weeks.    SIGNATURES/CONFIDENTIALITY: You and/or your care partner have signed paperwork which will be entered into your electronic medical record.  These signatures attest to the fact that that the information above  on your After Visit Summary has been reviewed and is understood.  Full responsibility of the confidentiality of this discharge information lies with you and/or your care-partner.

## 2015-07-25 NOTE — Progress Notes (Signed)
Called to room to assist during endoscopic procedure.  Patient ID and intended procedure confirmed with present staff. Received instructions for my participation in the procedure from the performing physician.  

## 2015-07-25 NOTE — Op Note (Addendum)
[place]  [address] [place], [address]   ENDOSCOPY PROCEDURE REPORT  PATIENT: Amanda, [name]  MR#: [id number] BIRTHDATE: 21-Jan-1941 , 76  yrs. old GENDER: female ENDOSCOPIST: Gatha Mayer, MD, [facility] PROCEDURE DATE:  07/25/2015 PROCEDURE:  Esophagoscopy with biopsy and balloon dilation < 30 mm ASA CLASS:     Class III INDICATIONS:  therapeutic procedure and dysphagia. MEDICATIONS: Propofol 140 mg IV and Monitored anesthesia care TOPICAL ANESTHETIC: none  DESCRIPTION OF PROCEDURE: After the risks benefits and alternatives of the procedure were thoroughly explained, informed consent was obtained.  The LB [id number] endoscope was introduced through the mouth and advanced to the stomach antrum , Without limitations.  The instrument was slowly withdrawn as the mucosa was fully examined.    1) Proximal and distal esophageal strictures that required dilation to 13 mm with 11,12,13 mm balloon to allow scope passage.  proximal area had a few rings/webs.   2) Multi-ring-like appearance to remainder of esophagus at times.  Biopsies taken 3) Normal stomach. Retroflexed views revealed no abnormalities.     The scope was then withdrawn from the patient and the procedure completed.  COMPLICATIONS: There were no immediate complications.  ENDOSCOPIC IMPRESSION: 1) Proximal and distal esophageal strictures that required dilation to 13 mm with 11,12,13 mm balloon to allow scope passage.  proximal area had a few rings/webs.  The appearance was sam as before dilation to 13 mm 07/12/2015 2) Multi-ring-like appearance to remainder of esophagus at times. Biopsies taken 3) Normal stomach  RECOMMENDATIONS: 1.  Await pathology results - ? eosinophilic esophagits, ? connective tissue disorder 2.  Clear liquids until 1230  , then soft foods rest of day.  Resume prior diet tomorrow. Dysphagia 3 as benfore 3.  Office will call with results 4. Future dilations probably  best Savary or Maloney with fluoro to allow total esophageal dilation  eSigned:  Gatha Mayer, MD, [facility] 07/25/2015 11:24 AM Revised: 07/25/2015 11:24 AM   CC: Dr. Tamsen Roers and The Patient

## 2015-07-26 ENCOUNTER — Telehealth: Payer: Self-pay | Admitting: Emergency Medicine

## 2015-07-26 NOTE — Telephone Encounter (Signed)
  Follow up Call-  Call back number 07/25/2015 07/12/2015 05/15/2015  Post procedure Call Back phone  # [phone]  Permission to leave phone message Yes Yes Yes     Patient questions:  Do you have a fever, pain , or abdominal swelling? No. Pain Score  0 *  Have you tolerated food without any problems? Yes.    Have you been able to return to your normal activities? Yes.    Do you have any questions about your discharge instructions: Diet   No. Medications  No. Follow up visit  No.  Do you have questions or concerns about your Care? No.  Actions: * If pain score is 4 or above: No action needed, pain <4.

## 2015-07-30 NOTE — Addendum Note (Signed)
Addended by: Steva Ready on: 07/30/2015 03:00 PM   Modules accepted: Level of Service

## 2015-08-04 NOTE — Progress Notes (Signed)
Quick Note:  Call from office - biopsies show inflammation - no cancer Stay on omeprazole Schedule for EGD w/ savary dilation NEED FLUORO during my hospital week please - not on 1/16 though I think MAC preferred but not a necessity   No recall or letter from [facility] ______

## 2015-08-06 ENCOUNTER — Other Ambulatory Visit: Payer: Self-pay

## 2015-08-06 DIAGNOSIS — K222 Esophageal obstruction: Secondary | ICD-10-CM

## 2015-08-06 NOTE — Addendum Note (Signed)
Addended by: Marlon Pel on: 08/06/2015 04:07 PM   Modules accepted: Orders

## 2015-08-09 ENCOUNTER — Ambulatory Visit (INDEPENDENT_AMBULATORY_CARE_PROVIDER_SITE_OTHER): Payer: Medicare HMO | Admitting: Pulmonary Disease

## 2015-08-09 ENCOUNTER — Encounter: Payer: Self-pay | Admitting: Pulmonary Disease

## 2015-08-09 VITALS — BP 134/78 | HR 51 | Ht 61.0 in | Wt 114.0 lb

## 2015-08-09 DIAGNOSIS — J432 Centrilobular emphysema: Secondary | ICD-10-CM | POA: Diagnosis not present

## 2015-08-09 NOTE — Patient Instructions (Signed)
We will apply for financial assistance for you for the Advair If that doesn't go through then we will change you to pulmicort and brovana We will see you back in 1 year or sooner if needed

## 2015-08-09 NOTE — Assessment & Plan Note (Signed)
She has severe airflow obstruction based on her July 2015 pulmonary function testing. However, this has been a stable interval for her. All of her immunizations are up-to-date. She is compliant with Advair. However, she is frustrated by the cost.  Plan: Continue Advair twice a day We will help apply for financial assistance for Advair Continue taking Xopenex as needed Follow-up one year worsen or if needed

## 2015-08-09 NOTE — Progress Notes (Signed)
Subjective:    Patient ID: Amanda [name], female    DOB: 04/23/1941, 75 y.o.   MRN: [id number]  Synopsis> likely COPD, first seen by [place] [facility] 2015. Also diagnosed with stage I colon cancer requiring a partial colectomy in 2015. July 2015 pulmonary function testing, clear airflow obstruction FEV1 37% predicted  HPI Chief Complaint  Patient presents with  . Follow-up    pt states she does well as long as she takes her maintenance meds.  wants to discuss cheaper alternatives to meds.     Tamicia is here with her daughter.  She has been doing OK. She says that as long as she takes the Advair she does OK. She wants to know if there any cheaper option. No exacerbations since last visit. No tobacco.  Past Medical History  Diagnosis Date  . Emphysema lung ([place])   . Hyperlipidemia   . GERD (gastroesophageal reflux disease)   . Vitamin D deficiency   . Closed fracture of unspecified part of upper end of humerus   . Complete rupture of rotator cuff     right shoulder- limited range of motion  . Other vitamin B12 deficiency anemia   . Neurogenic bladder, NOS     02-22-14 some bladder issues of urgency is somewhat improve.  Marland Kitchen COPD (chronic obstructive pulmonary disease) ([place])   . Type II or unspecified type diabetes mellitus without mention of complication, uncontrolled     diet controlled  . Essential hypertension, benign     off lisinporil for last 2 months  . Esophageal stricture 12/28/2013  . Arrhythmia     7-23-15x1beginning of colon surgery 01-12-14 - no problems since.  . Cancer ([place])     colon cancer 01-12-14- no further tx. intended  . Candida esophagitis ([place]) 01/09/2014  . HCAP (healthcare-associated pneumonia) 03/28/2014  . Hx of adenomatous colonic polyps 05/15/2015     Review of Systems  Constitutional: Negative for fever, chills and fatigue.  HENT: Negative for postnasal drip, rhinorrhea and sinus pressure.   Respiratory: Negative for cough, shortness of breath and  wheezing.   Cardiovascular: Negative for chest pain, palpitations and leg swelling.       Objective:   Physical Exam Filed Vitals:   08/09/15 1325  BP: 134/78  Pulse: 51  Height: 5\' 1"  (1.549 m)  Weight: 51.71 kg (114 lb)  SpO2: 97%  RA   Gen: well appearing, no acute distress HEENT: NCAT, OP Clear,  PULM: CTA B, good air movement CV: Irreg irreg, slight systolic murmur, no JVD AB: BS+, soft, nontender, no hsm Ext: warm, no edema, no clubbing, no cyanosis    Assessment & Plan:   COPD (chronic obstructive pulmonary disease) ([facility]) She has severe airflow obstruction based on her July 2015 pulmonary function testing. However, this has been a stable interval for her. All of her immunizations are up-to-date. She is compliant with Advair. However, she is frustrated by the cost.  Plan: Continue Advair twice a day We will help apply for financial assistance for Advair Continue taking Xopenex as needed Follow-up one year worsen or if needed    Updated Medication List Outpatient Encounter Prescriptions as of 08/09/2015  Medication Sig  . aspirin EC 81 MG EC tablet Take 1 tablet (81 mg total) by mouth daily.  . Cholecalciferol (VITAMIN D3) 5000 units CAPS Take 1 capsule by mouth daily.  . ferrous sulfate 325 (65 FE) MG tablet Take 1 tablet (325 mg total) by mouth daily.  . Fluticasone-Salmeterol (ADVAIR  DISKUS) 250-50 MCG/DOSE AEPB Inhale 1 puff into the lungs 2 (two) times daily.  Marland Kitchen levalbuterol (XOPENEX HFA) 45 MCG/ACT inhaler Inhale 2 puffs into the lungs every 6 (six) hours as needed for wheezing or shortness of breath.  . metFORMIN (GLUCOPHAGE) 500 MG tablet Take 500 mg by mouth daily with breakfast.  . metoprolol tartrate (LOPRESSOR) 25 MG tablet Take 1 tablet (25 mg total) by mouth 2 (two) times daily.  Marland Kitchen omeprazole (PRILOSEC) 40 MG capsule Take 1 capsule (40 mg total) by mouth 2 (two) times daily before a meal. 30 mins before Breakfast and supper  . triamcinolone (KENALOG)  0.025 % cream Apply 1 application topically 2 (two) times daily.  . [DISCONTINUED] Cholecalciferol (VITAMIN D3) 50000 UNITS TABS Take by mouth once a week. Reported on 08/09/2015   No facility-administered encounter medications on file as of 08/09/2015.

## 2015-08-21 ENCOUNTER — Telehealth: Payer: Self-pay

## 2015-08-21 ENCOUNTER — Ambulatory Visit (HOSPITAL_COMMUNITY): Payer: Medicare HMO

## 2015-08-21 ENCOUNTER — Encounter (HOSPITAL_COMMUNITY): Payer: Self-pay | Admitting: *Deleted

## 2015-08-21 ENCOUNTER — Ambulatory Visit (HOSPITAL_COMMUNITY)
Admission: RE | Admit: 2015-08-21 | Discharge: 2015-08-21 | Disposition: A | Discharge status: Home / Self Care | Payer: Medicare HMO | Source: Ambulatory Visit | Attending: Internal Medicine | Admitting: Internal Medicine

## 2015-08-21 ENCOUNTER — Encounter (HOSPITAL_COMMUNITY)
Admission: RE | Disposition: A | Discharge status: Home / Self Care | Payer: Self-pay | Source: Ambulatory Visit | Attending: Internal Medicine

## 2015-08-21 DIAGNOSIS — K222 Esophageal obstruction: Secondary | ICD-10-CM | POA: Diagnosis not present

## 2015-08-21 DIAGNOSIS — Z7982 Long term (current) use of aspirin: Secondary | ICD-10-CM | POA: Insufficient documentation

## 2015-08-21 DIAGNOSIS — E785 Hyperlipidemia, unspecified: Secondary | ICD-10-CM | POA: Insufficient documentation

## 2015-08-21 DIAGNOSIS — E559 Vitamin D deficiency, unspecified: Secondary | ICD-10-CM | POA: Diagnosis not present

## 2015-08-21 DIAGNOSIS — Z7984 Long term (current) use of oral hypoglycemic drugs: Secondary | ICD-10-CM | POA: Diagnosis not present

## 2015-08-21 DIAGNOSIS — E1165 Type 2 diabetes mellitus with hyperglycemia: Secondary | ICD-10-CM | POA: Insufficient documentation

## 2015-08-21 DIAGNOSIS — Z85038 Personal history of other malignant neoplasm of large intestine: Secondary | ICD-10-CM | POA: Insufficient documentation

## 2015-08-21 DIAGNOSIS — J449 Chronic obstructive pulmonary disease, unspecified: Secondary | ICD-10-CM | POA: Diagnosis not present

## 2015-08-21 DIAGNOSIS — I10 Essential (primary) hypertension: Secondary | ICD-10-CM | POA: Diagnosis not present

## 2015-08-21 DIAGNOSIS — Z79899 Other long term (current) drug therapy: Secondary | ICD-10-CM | POA: Insufficient documentation

## 2015-08-21 DIAGNOSIS — R1310 Dysphagia, unspecified: Secondary | ICD-10-CM | POA: Diagnosis present

## 2015-08-21 DIAGNOSIS — K219 Gastro-esophageal reflux disease without esophagitis: Secondary | ICD-10-CM | POA: Insufficient documentation

## 2015-08-21 DIAGNOSIS — Z87891 Personal history of nicotine dependence: Secondary | ICD-10-CM | POA: Diagnosis not present

## 2015-08-21 HISTORY — PX: ESOPHAGOGASTRODUODENOSCOPY: SHX5428

## 2015-08-21 HISTORY — PX: SAVORY DILATION: SHX5439

## 2015-08-21 LAB — GLUCOSE, CAPILLARY: GLUCOSE-CAPILLARY: 133 mg/dL — AB (ref 65–99)

## 2015-08-21 SURGERY — EGD (ESOPHAGOGASTRODUODENOSCOPY)
Anesthesia: Moderate Sedation

## 2015-08-21 MED ORDER — SODIUM CHLORIDE 0.9 % IV SOLN
INTRAVENOUS | Status: DC
  Administered 2015-08-21: 500 mL via INTRAVENOUS

## 2015-08-21 MED ORDER — MIDAZOLAM HCL 10 MG/2ML IJ SOLN
INTRAMUSCULAR | Status: DC | PRN
  Administered 2015-08-21: 2 mg via INTRAVENOUS
  Administered 2015-08-21: 1 mg via INTRAVENOUS

## 2015-08-21 MED ORDER — DIPHENHYDRAMINE HCL 50 MG/ML IJ SOLN
INTRAMUSCULAR | Status: AC
  Filled 2015-08-21: qty 1

## 2015-08-21 MED ORDER — MIDAZOLAM HCL 5 MG/ML IJ SOLN
INTRAMUSCULAR | Status: AC
  Filled 2015-08-21: qty 2

## 2015-08-21 MED ORDER — FENTANYL CITRATE (PF) 100 MCG/2ML IJ SOLN
INTRAMUSCULAR | Status: DC | PRN
  Administered 2015-08-21: 25 ug via INTRAVENOUS
  Administered 2015-08-21: 12.5 ug via INTRAVENOUS

## 2015-08-21 MED ORDER — FENTANYL CITRATE (PF) 100 MCG/2ML IJ SOLN
INTRAMUSCULAR | Status: AC
  Filled 2015-08-21: qty 2

## 2015-08-21 NOTE — Discharge Instructions (Addendum)
° °  I dilated to 12 mm with a different method today. As we discussed I think it would help for you to see an esophagus expert.  We will refer to [facility].  If you get worse and need dilation before you get there call me.  I appreciate the opportunity to care for you. Gatha Mayer, MD, FACG  YOU HAD AN ENDOSCOPIC PROCEDURE TODAY: Refer to the procedure report and other information in the discharge instructions given to you for any specific questions about what was found during the examination. If this information does not answer your questions, please call Dr. Celesta Aver office at [phone] to clarify.   YOU SHOULD EXPECT: Some feelings of bloating in the abdomen. Passage of more gas than usual. Walking can help get rid of the air that was put into your GI tract during the procedure and reduce the bloating. If you had a lower endoscopy (such as a colonoscopy or flexible sigmoidoscopy) you may notice spotting of blood in your stool or on the toilet paper. Some abdominal soreness may be present for a day or two, also.  DIET:    Clear liquids only until 1100 today then very soft diet. Tomorrow can have regular foods chopped or cut very small.    ACTIVITY: Your care partner should take you home directly after the procedure. You should plan to take it easy, moving slowly for the rest of the day. You can resume normal activity the day after the procedure however YOU SHOULD NOT DRIVE, use power tools, machinery or perform tasks that involve climbing or major physical exertion for 24 hours (because of the sedation medicines used during the test).   SYMPTOMS TO REPORT IMMEDIATELY: A gastroenterologist can be reached at any hour. Please call [phone]  for any of the following symptoms:   Following upper endoscopy (EGD, EUS, ERCP, esophageal dilation) Vomiting of blood or coffee ground material  New, significant abdominal pain  New, significant chest pain or pain under the shoulder blades   Painful or persistently difficult swallowing  New shortness of breath  Black, tarry-looking or red, bloody stools  FOLLOW UP:  If any biopsies were taken you will be contacted by phone or by letter within the next 1-3 weeks. Call [phone]  if you have not heard about the biopsies in 3 weeks.  Please also call with any specific questions about appointments or follow up tests.

## 2015-08-21 NOTE — Telephone Encounter (Signed)
-----   Message from Gatha Mayer, MD sent at 08/21/2015 10:22 AM EST ----- Regarding: [facility] referral Needs esophagologist referral  Amanda [name]....   Re: proximal and distal esophageal esophageal ring/strictures requiring frequent dilation - ? Cause  Evaluate and treat

## 2015-08-21 NOTE — H&P (Signed)
[facility] History and Physical   Primary Care Physician:  Tamsen Roers, MD   Reason for Procedure:   dilate esophagus  Plan:    Upper endoscopy and esophageal dilation The risks and benefits as well as alternatives of endoscopic procedure(s) have been discussed and reviewed. All questions answered. The patient agrees to proceed.      HPI: Amanda [name] is a 75 y.o. female with hx proximal and distal esophageal strictures requiring dilation. Had 2 dilations in Dec and second time findings similar to first. Decided to change from balloon dilation to bougie with fluoro. Says swallowing "doing better" but still having dysphagia.   Past Medical History  Diagnosis Date  . Emphysema lung ([place])   . Hyperlipidemia   . GERD (gastroesophageal reflux disease)   . Vitamin D deficiency   . Closed fracture of unspecified part of upper end of humerus   . Complete rupture of rotator cuff     right shoulder- limited range of motion  . Other vitamin B12 deficiency anemia   . Neurogenic bladder, NOS     02-22-14 some bladder issues of urgency is somewhat improve.  Marland Kitchen COPD (chronic obstructive pulmonary disease) ([place])   . Type II or unspecified type diabetes mellitus without mention of complication, uncontrolled     diet controlled  . Essential hypertension, benign     off lisinporil for last 2 months  . Esophageal stricture 12/28/2013  . Arrhythmia     7-23-15x1beginning of colon surgery 01-12-14 - no problems since.  . Cancer ([place])     colon cancer 01-12-14- no further tx. intended  . Candida esophagitis ([place]) 01/09/2014  . HCAP (healthcare-associated pneumonia) 03/28/2014  . Hx of adenomatous colonic polyps 05/15/2015    Past Surgical History  Procedure Laterality Date  . Orif shoulder fracture Right 2011    arthroplasty  . Tonsillectomy and adenoidectomy  age 56 or 53  . Eye surgery Bilateral 2010    both eyes lens replacments  . Esophagogastroduodenoscopy (egd) with  propofol N/A 01/09/2014    Procedure: ESOPHAGOGASTRODUODENOSCOPY (EGD) WITH PROPOFOL;  Surgeon: [name], MD;  Location: [facility];  Service: Endoscopy;  Laterality: N/A;  . Colonoscopy N/A 01/10/2014    Procedure: COLONOSCOPY;  Surgeon: [name], MD;  Location: [facility];  Service: Endoscopy;  Laterality: N/A;  . Laparoscopic partial colectomy N/A 01/12/2014    Procedure: LAPAROSCOPIC ASSISTED PARTIAL COLECTOMY AND REMOVAL OF RECTAL POLYP;  Surgeon: Odis Hollingshead, MD;  Location: [facility];  Service: General;  Laterality: N/A;  . Esophagogastroduodenoscopy N/A 01/16/2014    Procedure: ESOPHAGOGASTRODUODENOSCOPY (EGD);  Surgeon: Gatha Mayer, MD;  Location: [facility];  Service: Endoscopy;  Laterality: N/A;  . Esophagogastroduodenoscopy (egd) with propofol N/A 01/19/2014    Procedure: ESOPHAGOGASTRODUODENOSCOPY (EGD) WITH PROPOFOL;  Surgeon: Gatha Mayer, MD;  Location: [facility];  Service: Endoscopy;  Laterality: N/A;  . Cataract extraction, bilateral Bilateral   . Dilation and curettage of uterus    . Esophagogastroduodenoscopy N/A 03/02/2014    Procedure: ESOPHAGOGASTRODUODENOSCOPY (EGD);  Surgeon: Gatha Mayer, MD;  Location: [facility];  Service: Endoscopy;  Laterality: N/A;  . Balloon dilation N/A 03/02/2014    Procedure: BALLOON DILATION;  Surgeon: Gatha Mayer, MD;  Location: [facility];  Service: Endoscopy;  Laterality: N/A;  . Colon surgery      Prior to Admission medications   Medication Sig Start Date End Date Taking? Authorizing Provider  aspirin EC 81 MG EC tablet Take 1 tablet (81 mg  total) by mouth daily. 11/09/14  Yes Belva Crome, MD  Cholecalciferol (VITAMIN D3) 5000 units CAPS Take 1 capsule by mouth daily.   Yes Historical Provider, MD  ferrous sulfate 325 (65 FE) MG tablet Take 1 tablet (325 mg total) by mouth daily. 01/01/14  Yes Wandra Arthurs, MD  Fluticasone-Salmeterol (ADVAIR DISKUS) 250-50 MCG/DOSE AEPB Inhale 1 puff into the lungs 2 (two)  times daily. 04/04/14  Yes Juanito Doom, MD  levalbuterol [facility] HFA) 45 MCG/ACT inhaler Inhale 2 puffs into the lungs every 6 (six) hours as needed for wheezing or shortness of breath. 02/22/15  Yes Juanito Doom, MD  metFORMIN (GLUCOPHAGE) 500 MG tablet Take 500 mg by mouth daily with breakfast.   Yes Historical Provider, MD  metoprolol tartrate (LOPRESSOR) 25 MG tablet Take 1 tablet (25 mg total) by mouth 2 (two) times daily. 01/21/14  Yes Costin Karlyne Greenspan, MD  omeprazole (PRILOSEC) 40 MG capsule Take 1 capsule (40 mg total) by mouth 2 (two) times daily before a meal. 30 mins before Breakfast and supper 07/12/15  Yes Gatha Mayer, MD  triamcinolone (KENALOG) 0.025 % cream Apply 1 application topically 2 (two) times daily.   Yes Historical Provider, MD    Current Facility-Administered Medications  Medication Dose Route Frequency Provider Last Rate Last Dose  . 0.9 %  sodium chloride infusion   Intravenous Continuous Gatha Mayer, MD 20 mL/hr at 08/21/15 0811 500 mL at 08/21/15 0811    Allergies as of 08/06/2015  . (No Known Allergies)    Family History  Problem Relation Age of Onset  . Diabetes Father   . Bone cancer Father     bone marrow  . Emphysema Mother   . Pneumonia Mother   . Colon cancer Neg Hx   . Esophageal cancer Neg Hx   . Stomach cancer Neg Hx     Social History   Social History  . Marital Status: Married    Spouse Name: N/A  . Number of Children: 4  . Years of Education: N/A   Occupational History  . retired    Social History Main Topics  . Smoking status: Former Smoker -- 1.00 packs/day for 20 years    Types: Cigarettes    Quit date: 08/03/2005  . Smokeless tobacco: Never Used  . Alcohol Use: No  . Drug Use: No  . Sexual Activity: No   Other Topics Concern  . 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 2 caffeinated beverages daily.   Lives in [place].    Review of  Systems: Positive for chronic dyspnea All other review of systems negative except as mentioned in the HPI.  Physical Exam: Vital signs in last 24 hours: Temp:  [97.9 F (36.6 C)] 97.9 F (36.6 C) (01/18 0801) Pulse Rate:  [51] 51 (01/18 0801) Resp:  [16] 16 (01/18 0801) BP: (156)/(56) 156/56 mmHg (01/18 0801) SpO2:  [98 %] 98 % (01/18 0801) Weight:  [114 lb (51.71 kg)] 114 lb (51.71 kg) (01/18 0801)   General:   Alert,  Well-developed,elderly pleasant and cooperative in NAD Lungs:  Clear throughout to auscultation.   Heart:  Regular rate and rhythm; no murmurs, clicks, rubs,  or gallops. Abdomen:  Soft, nontender and nondistended. Normal bowel sounds.   Neuro/Psych:  Alert and cooperative. Normal mood and affect. A and O x 3   @Carl  Simonne Maffucci, MD, [facility] [phone] (pager)  08/21/2015 8:33 AM@

## 2015-08-21 NOTE — Telephone Encounter (Signed)
Referral faxed to [facility]. 

## 2015-08-21 NOTE — Op Note (Addendum)
[facility] [place], [address]   ENDOSCOPY PROCEDURE REPORT  PATIENT: Amanda [name], Amanda [name]  MR#: [id number] BIRTHDATE: 30-Jul-1941 , 23  yrs. old GENDER: female ENDOSCOPIST: Gatha Mayer, MD, [facility] PROCEDURE DATE:  08/21/2015 PROCEDURE:  Savary dilation of esophagus and Esophagoscopy ASA CLASS:     Class III INDICATIONS:  therapeutic procedure.  dilate esophagus MEDICATIONS: 37.5 and Versed 3 mg IV    given under my supervision for the duration of case - less than 15 mins TOPICAL ANESTHETIC: Cetacaine Spray  DESCRIPTION OF PROCEDURE: After the risks benefits and alternatives of the procedure were thoroughly explained, informed consent was obtained.  The PENTAX GASTOROSCOPE [id number] endoscope was introduced through the mouth and advanced to the stomach body , Without limitations.  The instrument was slowly withdrawn as the mucosa was fully examined. Photos not taken.    1) Benign-appearing web-like stricture proximal esophagus just like in past procedures 07/2015.  Scope would not pass. 2) Under fluoro I passed Savary wire to stomach and dilated with 11 mm Savary easily no heme- and then more resistance w/ 12 mm and some heme . 3) Reinspection allowed scope passage to stomach and showed good dilation effect at both strictures and changes at distal esophagus such that I did not think further dilation appropriate. 4) No dilation effect in other areas of esophagus.  Retroflexion was not performed.     The scope was then withdrawn from the patient and the procedure completed.  COMPLICATIONS: There were no immediate complications.  ENDOSCOPIC IMPRESSION: 1) Benign-appearing web-like stricture proximal esophagus just like in past procedures 07/2015.  Scope would not pass. 2) Under fluoro I passed Savary wire to stomach and dilated with 11 mm Savary easily no heme- and then more resistance w/ 12 mm and some heme . 3) Reinspection allowed scope passage to  stomach and showed good dilation effect at both strictures and changes at distal esophagus such that I did not think further dilation appropriate. 4) No dilation effect in other areas of esophagus   - exam to proximal gastric body (normal)  RECOMMENDATIONS: 1.  Clear liquids until 1100, then soft foods rest of day.  Resume prior diet tomorrow. 2.  I think a tertiary esophagologist opinion would help - I do not understand cause of these strictures.  I will see if she is willing to do that.  eSigned:  Gatha Mayer, MD, [facility] 08/21/2015 10:14 AM Revised: 08/21/2015 10:14 AM   CC: Dr. Tamsen Roers and The Patient

## 2015-08-22 ENCOUNTER — Encounter (HOSPITAL_COMMUNITY): Payer: Self-pay | Admitting: Internal Medicine

## 2015-09-02 NOTE — Telephone Encounter (Signed)
Patient is scheduled to see Dr. Kris Mouton at [facility] 11/27/15 9:00.  Appt was arranged directly with the patient and she is aware of the appt

## 2016-02-02 ENCOUNTER — Emergency Department (HOSPITAL_COMMUNITY): Payer: Medicare HMO

## 2016-02-02 ENCOUNTER — Encounter (HOSPITAL_COMMUNITY): Payer: Self-pay | Admitting: *Deleted

## 2016-02-02 ENCOUNTER — Emergency Department (HOSPITAL_COMMUNITY)
Admission: EM | Admit: 2016-02-02 | Discharge: 2016-02-02 | Disposition: A | Discharge status: Home / Self Care | Payer: Medicare HMO | Attending: Emergency Medicine | Admitting: Emergency Medicine

## 2016-02-02 DIAGNOSIS — W010XXA Fall on same level from slipping, tripping and stumbling without subsequent striking against object, initial encounter: Secondary | ICD-10-CM | POA: Diagnosis not present

## 2016-02-02 DIAGNOSIS — Y939 Activity, unspecified: Secondary | ICD-10-CM | POA: Diagnosis not present

## 2016-02-02 DIAGNOSIS — Z87891 Personal history of nicotine dependence: Secondary | ICD-10-CM | POA: Diagnosis not present

## 2016-02-02 DIAGNOSIS — S52592B Other fractures of lower end of left radius, initial encounter for open fracture type I or II: Secondary | ICD-10-CM | POA: Insufficient documentation

## 2016-02-02 DIAGNOSIS — I10 Essential (primary) hypertension: Secondary | ICD-10-CM | POA: Insufficient documentation

## 2016-02-02 DIAGNOSIS — Z85038 Personal history of other malignant neoplasm of large intestine: Secondary | ICD-10-CM | POA: Insufficient documentation

## 2016-02-02 DIAGNOSIS — Z79899 Other long term (current) drug therapy: Secondary | ICD-10-CM | POA: Insufficient documentation

## 2016-02-02 DIAGNOSIS — E119 Type 2 diabetes mellitus without complications: Secondary | ICD-10-CM | POA: Insufficient documentation

## 2016-02-02 DIAGNOSIS — Q899 Congenital malformation, unspecified: Secondary | ICD-10-CM

## 2016-02-02 DIAGNOSIS — S52502B Unspecified fracture of the lower end of left radius, initial encounter for open fracture type I or II: Secondary | ICD-10-CM

## 2016-02-02 DIAGNOSIS — J449 Chronic obstructive pulmonary disease, unspecified: Secondary | ICD-10-CM | POA: Insufficient documentation

## 2016-02-02 DIAGNOSIS — E785 Hyperlipidemia, unspecified: Secondary | ICD-10-CM | POA: Insufficient documentation

## 2016-02-02 DIAGNOSIS — Z7984 Long term (current) use of oral hypoglycemic drugs: Secondary | ICD-10-CM | POA: Insufficient documentation

## 2016-02-02 DIAGNOSIS — S6992XA Unspecified injury of left wrist, hand and finger(s), initial encounter: Secondary | ICD-10-CM | POA: Diagnosis present

## 2016-02-02 DIAGNOSIS — Y999 Unspecified external cause status: Secondary | ICD-10-CM | POA: Diagnosis not present

## 2016-02-02 DIAGNOSIS — S0101XA Laceration without foreign body of scalp, initial encounter: Secondary | ICD-10-CM | POA: Diagnosis not present

## 2016-02-02 DIAGNOSIS — Z7982 Long term (current) use of aspirin: Secondary | ICD-10-CM | POA: Insufficient documentation

## 2016-02-02 DIAGNOSIS — Y929 Unspecified place or not applicable: Secondary | ICD-10-CM | POA: Insufficient documentation

## 2016-02-02 MED ORDER — MORPHINE SULFATE (PF) 4 MG/ML IV SOLN
4.0000 mg | INTRAVENOUS | Status: DC | PRN
  Administered 2016-02-02: 4 mg via INTRAVENOUS
  Filled 2016-02-02: qty 1

## 2016-02-02 MED ORDER — LIDOCAINE HCL (PF) 1 % IJ SOLN
10.0000 mL | Freq: Once | INTRAMUSCULAR | Status: AC
Start: 2016-02-02 — End: 2016-02-02
  Administered 2016-02-02: 10 mL
  Filled 2016-02-02: qty 30

## 2016-02-02 MED ORDER — CEPHALEXIN 500 MG PO CAPS: 500.0000 mg | ORAL_CAPSULE | Freq: Four times a day (QID) | ORAL | Status: DC

## 2016-02-02 MED ORDER — LIDOCAINE-EPINEPHRINE 2 %-1:100000 IJ SOLN: 20.0000 mL | Freq: Once | INTRAMUSCULAR | Status: DC

## 2016-02-02 MED ORDER — TRAMADOL HCL 50 MG PO TABS: 50.0000 mg | ORAL_TABLET | Freq: Four times a day (QID) | ORAL | Status: DC | PRN

## 2016-02-02 MED ORDER — CEFAZOLIN IN D5W 1 GM/50ML IV SOLN
1.0000 g | Freq: Once | INTRAVENOUS | Status: AC
  Administered 2016-02-02: 1 g via INTRAVENOUS
  Filled 2016-02-02: qty 50

## 2016-02-02 MED ORDER — PROPOFOL 10 MG/ML IV BOLUS
25.0000 mg | INTRAVENOUS | Status: DC | PRN
  Administered 2016-02-02: 25 mg via INTRAVENOUS
  Filled 2016-02-02: qty 20

## 2016-02-02 NOTE — ED Notes (Signed)
Per pt and family report: pt states she was on the porch carrying a large jug and states she "over balanced" and fell on the concrete.  Pt tried to catch her fall on her left wrist.  Obvious deformity noted.  Pt also has a laceration above her right eye brow.  Pt denies LOC.  Pt a/o x 4. Family states pt is normally unsteady on her feet.

## 2016-02-02 NOTE — ED Provider Notes (Signed)
CSN: [id number]     Arrival date & time 02/02/16  1919 History   First MD Initiated Contact with Patient 02/02/16 1921     Chief Complaint  Patient presents with  . Fall  . Wrist Injury   HPI Pt was walking outside carrying a large jug when she tripped and fell.  She landed on her left wrist and also hit her head.  She knows that she broke her wrist.  It is deformed and painful.    She denies any loss of consciousness.  No headache or neck pain.  No back pain.  No blurred vision or difficulty breathing.  She takes an aspirin but no other blood thinning agents.  Past Medical History  Diagnosis Date  . Emphysema lung ([place])   . Hyperlipidemia   . GERD (gastroesophageal reflux disease)   . Vitamin D deficiency   . Closed fracture of unspecified part of upper end of humerus   . Complete rupture of rotator cuff     right shoulder- limited range of motion  . Other vitamin B12 deficiency anemia   . Neurogenic bladder, NOS     02-22-14 some bladder issues of urgency is somewhat improve.  Marland Kitchen COPD (chronic obstructive pulmonary disease) ([place])   . Type II or unspecified type diabetes mellitus without mention of complication, uncontrolled     diet controlled  . Essential hypertension, benign     off lisinporil for last 2 months  . Esophageal stricture 12/28/2013  . Arrhythmia     7-23-15x1beginning of colon surgery 01-12-14 - no problems since.  . Cancer ([place])     colon cancer 01-12-14- no further tx. intended  . Candida esophagitis ([place]) 01/09/2014  . HCAP (healthcare-associated pneumonia) 03/28/2014  . Hx of adenomatous colonic polyps 05/15/2015   Past Surgical History  Procedure Laterality Date  . Orif shoulder fracture Right 2011    arthroplasty  . Tonsillectomy and adenoidectomy  age 75 or 75  . Eye surgery Bilateral 2010    both eyes lens replacments  . Esophagogastroduodenoscopy (egd) with propofol N/A 01/09/2014    Procedure: ESOPHAGOGASTRODUODENOSCOPY (EGD) WITH PROPOFOL;  Surgeon: [name], MD;  Location: [facility];  Service: Endoscopy;  Laterality: N/A;  . Colonoscopy N/A 01/10/2014    Procedure: COLONOSCOPY;  Surgeon: [name], MD;  Location: [facility];  Service: Endoscopy;  Laterality: N/A;  . Laparoscopic partial colectomy N/A 01/12/2014    Procedure: LAPAROSCOPIC ASSISTED PARTIAL COLECTOMY AND REMOVAL OF RECTAL POLYP;  Surgeon: Odis Hollingshead, MD;  Location: [facility]S;  Service: General;  Laterality: N/A;  . Esophagogastroduodenoscopy N/A 01/16/2014    Procedure: ESOPHAGOGASTRODUODENOSCOPY (EGD);  Surgeon: Gatha Mayer, MD;  Location: [facility];  Service: Endoscopy;  Laterality: N/A;  . Esophagogastroduodenoscopy (egd) with propofol N/A 01/19/2014    Procedure: ESOPHAGOGASTRODUODENOSCOPY (EGD) WITH PROPOFOL;  Surgeon: Gatha Mayer, MD;  Location: [facility];  Service: Endoscopy;  Laterality: N/A;  . Cataract extraction, bilateral Bilateral   . Dilation and curettage of uterus    . Esophagogastroduodenoscopy N/A 03/02/2014    Procedure: ESOPHAGOGASTRODUODENOSCOPY (EGD);  Surgeon: Gatha Mayer, MD;  Location: [facility];  Service: Endoscopy;  Laterality: N/A;  . Balloon dilation N/A 03/02/2014    Procedure: BALLOON DILATION;  Surgeon: Gatha Mayer, MD;  Location: [facility];  Service: Endoscopy;  Laterality: N/A;  . Colon surgery    . Esophagogastroduodenoscopy N/A 08/21/2015    Procedure: ESOPHAGOGASTRODUODENOSCOPY (EGD);  Surgeon: Gatha Mayer, MD;  Location: [facility];  Service: Endoscopy;  Laterality: N/A;  . Savory dilation N/A 08/21/2015    Procedure: SAVORY DILATION;  Surgeon: Gatha Mayer, MD;  Location: [facility];  Service: Endoscopy;  Laterality: N/A;   Family History  Problem Relation Age of Onset  . Diabetes Father   . Bone cancer Father     bone marrow  . Emphysema Mother   . Pneumonia Mother   . Colon cancer Neg Hx   . Esophageal cancer Neg Hx   . Stomach cancer Neg Hx    Social History  Substance Use Topics  .  Smoking status: Former Smoker -- 1.00 packs/day for 20 years    Types: Cigarettes    Quit date: 08/03/2005  . Smokeless tobacco: Never Used  . Alcohol Use: No   OB History    No data available     Review of Systems  All other systems reviewed and are negative.     Allergies  Review of patient's allergies indicates no known allergies.  Home Medications   Prior to Admission medications   Medication Sig Start Date End Date Taking? Authorizing Provider  ADVAIR DISKUS 100-50 MCG/DOSE AEPB Inhale 1 puff into the lungs 2 (two) times daily. 01/02/16  Yes Historical Provider, MD  aspirin EC 81 MG EC tablet Take 1 tablet (81 mg total) by mouth daily. 11/09/14  Yes Belva Crome, MD  Cholecalciferol (VITAMIN D3) 5000 units CAPS Take 1 capsule by mouth daily.   Yes Historical Provider, MD  ferrous sulfate 325 (65 FE) MG tablet Take 1 tablet (325 mg total) by mouth daily. 01/01/14  Yes Wandra Arthurs, MD  levalbuterol [facility] HFA) 45 MCG/ACT inhaler Inhale 2 puffs into the lungs every 6 (six) hours as needed for wheezing or shortness of breath. 02/22/15  Yes Juanito Doom, MD  metFORMIN (GLUCOPHAGE) 1000 MG tablet Take 1,000 mg by mouth daily. 11/25/15  Yes Historical Provider, MD  metoprolol tartrate (LOPRESSOR) 25 MG tablet Take 1 tablet (25 mg total) by mouth 2 (two) times daily. 01/21/14  Yes Costin Karlyne Greenspan, MD  NONFORMULARY OR COMPOUNDED ITEM Take 2 mg by mouth at bedtime. Budesonide viscous 1mg /65ml oral suspension.  Take 16 ml (2mg ) by mouth every night at bedtime. Take nothing by mouth for 1 hour after dose.  [facility] Compounding [phone].   Yes Historical Provider, MD  omeprazole (PRILOSEC) 40 MG capsule Take 1 capsule (40 mg total) by mouth 2 (two) times daily before a meal. 30 mins before Breakfast and supper 07/12/15  Yes Gatha Mayer, MD  oxybutynin (DITROPAN) 5 MG tablet Take 5 mg by mouth 2 (two) times daily. 01/23/16  Yes Historical Provider, MD  cephALEXin (KEFLEX) 500 MG  capsule Take 1 capsule (500 mg total) by mouth 4 (four) times daily. 02/02/16   Dorie Rank, MD  Fluticasone-Salmeterol (ADVAIR DISKUS) 250-50 MCG/DOSE AEPB Inhale 1 puff into the lungs 2 (two) times daily. Patient not taking: Reported on 02/02/2016 04/04/14   Juanito Doom, MD   BP 173/95 mmHg  Pulse 55  Temp(Src) 98 F (36.7 C) (Oral)  Resp 17  Ht 5\' 1"  (1.549 m)  Wt 52.164 kg  BMI 21.74 kg/m2  SpO2 100% Physical Exam  Constitutional: She appears well-developed and well-nourished. No distress.  HENT:  Head: Normocephalic.  Right Ear: External ear normal.  Left Ear: External ear normal.  Irregular Laceration right forehead completely through the dermis   Eyes: Conjunctivae are normal. Right eye exhibits no discharge.  Left eye exhibits no discharge. No scleral icterus.  Neck: Neck supple. No tracheal deviation present.  Cardiovascular: Normal rate, regular rhythm and normal heart sounds.   Pulmonary/Chest: Effort normal and breath sounds normal. No stridor. No respiratory distress. She has no wheezes. She has no rales. She exhibits no tenderness.  Musculoskeletal: She exhibits edema and tenderness.       Left elbow: Normal.       Right wrist: Normal.       Left wrist: She exhibits tenderness, bony tenderness, swelling and deformity.       Right hip: Normal.       Left hip: Normal.       Cervical back: Normal.       Thoracic back: Normal.       Lumbar back: Normal.  Neurological: She is alert. Cranial nerve deficit: no gross deficits.  Skin: Skin is warm and dry. No rash noted.  Psychiatric: She has a normal mood and affect.  Nursing note and vitals reviewed.   ED Course  ORTHOPEDIC INJURY TREATMENT Date/Time: 02/02/2016 8:39 PM Performed by: Dorie Rank Authorized by: Dorie Rank Consent: Verbal consent obtained. Written consent obtained. Risks and benefits: risks, benefits and alternatives were discussed Consent given by: patient Injury location: wrist Location details: left  wrist Injury type: fracture Fracture type: distal radius Pre-procedure neurovascular assessment: neurovascularly intact Pre-procedure distal perfusion: normal Pre-procedure neurological function: normal Pre-procedure range of motion: normal Local anesthesia used: no Manipulation performed: yes Skeletal traction used: yes Reduction successful: yes X-ray confirmed reduction: yes Immobilization: splint Splint type: sugar tong Post-procedure neurovascular assessment: post-procedure neurovascularly intact Post-procedure distal perfusion: normal Post-procedure neurological function: normal Post-procedure range of motion: normal Patient tolerance: Patient tolerated the procedure well with no immediate complications Comments: Small pin point defect in the skin volar aspect noted prior to the procedure  .Sedation Date/Time: 02/02/2016 8:39 PM Performed by: Dorie Rank Authorized by: Dorie Rank  Consent:    Consent obtained:  Verbal   Consent given by:  Patient   Risks discussed:  Allergic reaction, dysrhythmia, inadequate sedation and nausea Universal protocol:    Procedure explained and questions answered to patient or proxy's satisfaction: yes     Relevant documents present and verified: yes     Test results available and properly labeled: yes     Imaging studies available: yes     Required blood products, implants, devices, and special equipment available: yes     Site/side marked: yes     Immediately prior to procedure a time out was called: yes   Indications:    Sedation purpose:  Fracture reduction   Procedure necessitating sedation performed by:  Physician performing sedation   Intended level of sedation:  Deep Pre-sedation assessment:    NPO status caution: unable to specify NPO status     ASA classification: class 2 - patient with mild systemic disease     Neck mobility: normal     Mouth opening:  3 or more finger widths   Thyromental distance:  4 finger widths    Mallampati score:  I - soft palate, uvula, fauces, pillars visible   Pre-sedation assessments completed and reviewed: airway patency, cardiovascular function, mental status, nausea/vomiting, respiratory function and temperature     Pre-sedation assessment completed:  02/02/2016 8:41 PM Immediate pre-procedure details:    Reassessment: Patient reassessed immediately prior to procedure     Reviewed: vital signs     Verified: bag valve mask available, emergency equipment available, intubation equipment available,  IV patency confirmed, oxygen available and suction available   Procedure details (see MAR for exact dosages):    Sedation start time:  02/02/2016 9:00 PM   Preoxygenation:  Nasal cannula   Sedation:  Propofol   Intra-procedure monitoring:  Blood pressure monitoring, cardiac monitor, continuous capnometry, continuous pulse oximetry, frequent LOC assessments and frequent vital sign checks   Intra-procedure events: none     Sedation end time:  02/02/2016 9:15 PM Post-procedure details:    Recovery: Patient returned to pre-procedure baseline     Post-sedation assessments completed and reviewed: airway patency, cardiovascular function, hydration status, mental status, nausea/vomiting and pain level     Labs Review Labs Reviewed - No data to display  Imaging Review Dg Wrist 2 Views Left  02/02/2016  CLINICAL DATA:  Post reduction left wrist. EXAM: LEFT WRIST - 2 VIEW COMPARISON:  Earlier same day. FINDINGS: Comminuted fracture of the distal radial diametaphyseal region with extension to the articular surface. Moderately improved alignment with near anatomic alignment post reduction. Distal ulnar metaphyseal fracture without significant displacement. Mild ulnar minus variance. Remainder of the exam is unchanged. IMPRESSION: Comminuted fracture of the distal radius with extension to the articular surface with near anatomic alignment post reduction. Distal ulnar metaphyseal fracture without significant  displacement. Electronically Signed   By: Marin Olp M.D.   On: 02/02/2016 21:28   Dg Wrist Complete Left  02/02/2016  CLINICAL DATA:  Fall today with left wrist pain. Left wrist swelling and bruising. Fall while on the porch carrying a large jug and lost balance falling onto concrete. EXAM: LEFT WRIST - COMPLETE 3+ VIEW COMPARISON:  None. FINDINGS: Comminuted fracture of the distal radius with apex volar angulation. There is moderate displacement. There is likely nondisplaced extension to the radiocarpal joint. The carpus remain aligned with the dominant fracture fragment. Mildly displaced distal ulna fracture proximal to the ulna styloid, with questionable additional ulna styloid avulsion. Associated soft tissue edema. The bones are under mineralized. There is advanced degenerative change at the base of the thumb. IMPRESSION: Comminuted displaced fracture of the distal radius, probable radiocarpal extension. Mildly displaced distal ulna fracture. Electronically Signed   By: Jeb Levering M.D.   On: 02/02/2016 19:58   I have personally reviewed and evaluated these images and lab results as part of my medical decision-making.  Medications given in the ED Medications  morphine 4 MG/ML injection 4 mg (4 mg Intravenous Given 02/02/16 2017)  propofol (DIPRIVAN) 10 mg/mL bolus/IV push 25 mg (0 mg Intravenous Stopped 02/02/16 2107)  lidocaine-EPINEPHrine (XYLOCAINE W/EPI) 2 %-1:100000 (with pres) injection 20 mL (20 mLs Infiltration Not Given 02/02/16 2127)  ceFAZolin (ANCEF) IVPB 1 g/50 mL premix (1 g Intravenous New Bag/Given 02/02/16 2219)  lidocaine (PF) (XYLOCAINE) 1 % injection 10 mL (10 mLs Infiltration Given by Other 02/02/16 2115)     MDM   Final diagnoses:  Scalp laceration, initial encounter  Distal radius fracture, left, open type I or II, initial encounter    Pt has a small pin point defect in the skin.  Possible open component fracture.  Pt was successfully reduced for comfort and better  alignment by me.  Post reduction films show much better alignment.  Will splint and consult with orthopedics to see if she will need OR washout tonight or can we manage conservatively with abx and close follow up.  D/w Dr Onnie Graham.  Suggested I speak with Dr Lenon Curt.     D/w Dr Lenon Curt about the pinpoint defect in the  skin.  Recommends a dose of abx in the ED and send home on oral abx.  Pt can follow up with him in the office or she can see Dr Amedeo Plenty if she prefers.      Dorie Rank, MD 02/02/16 2224

## 2016-02-02 NOTE — Discharge Instructions (Signed)
You need to have your sutures taken out in 5 days.  Please keep the wound clean and dry.  Return for redness, pus, or increased pain.  Take the antibiotics as prescribed.  Follow up with Dr Lenon Curt or Dr Amedeo Plenty.  Call the office tomorrow to arrange follow up.

## 2016-02-02 NOTE — ED Provider Notes (Signed)
LACERATION REPAIR Performed by: Montine Circle Authorized by: Montine Circle Consent: Verbal consent obtained. Risks and benefits: risks, benefits and alternatives were discussed Consent given by: patient Patient identity confirmed: provided demographic data Prepped and Draped in normal sterile fashion Wound explored  Laceration Location: Right Forehead  Laceration Length: 3 cm  No Foreign Bodies seen or palpated  Anesthesia: local infiltration  Local anesthetic: lidocaine 1% without epinephrine  Anesthetic total: 3 ml  Irrigation method: syringe Amount of cleaning: standard  Skin closure: 5-0 prolene  Number of sutures: 8  Technique: Running  Patient tolerance: Patient tolerated the procedure well with no immediate complications.   Montine Circle, PA-C 02/02/16 2130  Dorie Rank, MD 02/02/16 2200

## 2016-02-24 ENCOUNTER — Encounter: Payer: Self-pay | Admitting: Internal Medicine

## 2016-07-09 ENCOUNTER — Encounter: Payer: Self-pay | Admitting: Internal Medicine

## 2016-07-21 ENCOUNTER — Telehealth: Payer: Self-pay | Admitting: Oncology

## 2016-07-21 NOTE — Telephone Encounter (Signed)
Mailed records to [facility]/ risk adjustment °

## 2016-07-21 NOTE — Telephone Encounter (Signed)
MAILED RECORDS TO [facility]-RISK ADJUSTMENT °

## 2016-08-09 ENCOUNTER — Inpatient Hospital Stay (HOSPITAL_COMMUNITY)
Admission: EM | Admit: 2016-08-09 | Discharge: 2016-08-10 | DRG: 872 | Disposition: A | Discharge status: Home Health | Payer: MEDICARE | Attending: Family Medicine | Admitting: Family Medicine

## 2016-08-09 ENCOUNTER — Encounter (HOSPITAL_COMMUNITY): Payer: Self-pay | Admitting: Emergency Medicine

## 2016-08-09 ENCOUNTER — Emergency Department (HOSPITAL_COMMUNITY): Payer: MEDICARE

## 2016-08-09 DIAGNOSIS — J189 Pneumonia, unspecified organism: Secondary | ICD-10-CM | POA: Diagnosis not present

## 2016-08-09 DIAGNOSIS — J441 Chronic obstructive pulmonary disease with (acute) exacerbation: Secondary | ICD-10-CM | POA: Diagnosis present

## 2016-08-09 DIAGNOSIS — Z87891 Personal history of nicotine dependence: Secondary | ICD-10-CM

## 2016-08-09 DIAGNOSIS — A4189 Other specified sepsis: Principal | ICD-10-CM | POA: Diagnosis present

## 2016-08-09 DIAGNOSIS — J440 Chronic obstructive pulmonary disease with acute lower respiratory infection: Secondary | ICD-10-CM | POA: Diagnosis not present

## 2016-08-09 DIAGNOSIS — Z85038 Personal history of other malignant neoplasm of large intestine: Secondary | ICD-10-CM | POA: Diagnosis not present

## 2016-08-09 DIAGNOSIS — I4891 Unspecified atrial fibrillation: Secondary | ICD-10-CM | POA: Diagnosis present

## 2016-08-09 DIAGNOSIS — N390 Urinary tract infection, site not specified: Secondary | ICD-10-CM | POA: Diagnosis present

## 2016-08-09 DIAGNOSIS — Z794 Long term (current) use of insulin: Secondary | ICD-10-CM | POA: Diagnosis not present

## 2016-08-09 DIAGNOSIS — Z7982 Long term (current) use of aspirin: Secondary | ICD-10-CM

## 2016-08-09 DIAGNOSIS — I10 Essential (primary) hypertension: Secondary | ICD-10-CM | POA: Diagnosis present

## 2016-08-09 DIAGNOSIS — R05 Cough: Secondary | ICD-10-CM

## 2016-08-09 DIAGNOSIS — A419 Sepsis, unspecified organism: Secondary | ICD-10-CM

## 2016-08-09 DIAGNOSIS — Z825 Family history of asthma and other chronic lower respiratory diseases: Secondary | ICD-10-CM

## 2016-08-09 DIAGNOSIS — K219 Gastro-esophageal reflux disease without esophagitis: Secondary | ICD-10-CM | POA: Diagnosis present

## 2016-08-09 DIAGNOSIS — B974 Respiratory syncytial virus as the cause of diseases classified elsewhere: Secondary | ICD-10-CM | POA: Diagnosis present

## 2016-08-09 DIAGNOSIS — E861 Hypovolemia: Secondary | ICD-10-CM | POA: Diagnosis not present

## 2016-08-09 DIAGNOSIS — Z808 Family history of malignant neoplasm of other organs or systems: Secondary | ICD-10-CM

## 2016-08-09 DIAGNOSIS — E875 Hyperkalemia: Secondary | ICD-10-CM | POA: Diagnosis not present

## 2016-08-09 DIAGNOSIS — J210 Acute bronchiolitis due to respiratory syncytial virus: Secondary | ICD-10-CM | POA: Diagnosis not present

## 2016-08-09 DIAGNOSIS — Z833 Family history of diabetes mellitus: Secondary | ICD-10-CM

## 2016-08-09 DIAGNOSIS — E118 Type 2 diabetes mellitus with unspecified complications: Secondary | ICD-10-CM | POA: Diagnosis present

## 2016-08-09 DIAGNOSIS — K222 Esophageal obstruction: Secondary | ICD-10-CM | POA: Diagnosis present

## 2016-08-09 DIAGNOSIS — I480 Paroxysmal atrial fibrillation: Secondary | ICD-10-CM | POA: Diagnosis not present

## 2016-08-09 DIAGNOSIS — E871 Hypo-osmolality and hyponatremia: Secondary | ICD-10-CM | POA: Diagnosis not present

## 2016-08-09 DIAGNOSIS — E785 Hyperlipidemia, unspecified: Secondary | ICD-10-CM | POA: Diagnosis not present

## 2016-08-09 DIAGNOSIS — R059 Cough, unspecified: Secondary | ICD-10-CM

## 2016-08-09 LAB — COMPREHENSIVE METABOLIC PANEL
ALT: 13 U/L — AB (ref 14–54)
AST: 40 U/L (ref 15–41)
Albumin: 4.3 g/dL (ref 3.5–5.0)
Alkaline Phosphatase: 71 U/L (ref 38–126)
Anion gap: 9 (ref 5–15)
BUN: 16 mg/dL (ref 6–20)
CALCIUM: 9 mg/dL (ref 8.9–10.3)
CHLORIDE: 93 mmol/L — AB (ref 101–111)
CO2: 24 mmol/L (ref 22–32)
CREATININE: 1.24 mg/dL — AB (ref 0.44–1.00)
GFR, EST AFRICAN AMERICAN: 47 mL/min — AB (ref >60)
GFR, EST NON AFRICAN AMERICAN: 41 mL/min — AB (ref >60)
Glucose, Bld: 133 mg/dL — ABNORMAL HIGH (ref 65–99)
Potassium: 5.6 mmol/L — ABNORMAL HIGH (ref 3.5–5.1)
SODIUM: 126 mmol/L — AB (ref 135–145)
Total Bilirubin: 1.7 mg/dL — ABNORMAL HIGH (ref 0.3–1.2)
Total Protein: 7.7 g/dL (ref 6.5–8.1)

## 2016-08-09 LAB — LACTIC ACID, PLASMA
LACTIC ACID, VENOUS: 3.6 mmol/L — AB (ref 0.5–1.9)
Lactic Acid, Venous: 3.6 mmol/L (ref 0.5–1.9)

## 2016-08-09 LAB — I-STAT CG4 LACTIC ACID, ED: LACTIC ACID, VENOUS: 3.13 mmol/L — AB (ref 0.5–1.9)

## 2016-08-09 LAB — CBG MONITORING, ED: Glucose-Capillary: 159 mg/dL — ABNORMAL HIGH (ref 65–99)

## 2016-08-09 LAB — CBC WITH DIFFERENTIAL/PLATELET
BASOS PCT: 1 %
Basophils Absolute: 0.1 10*3/uL (ref 0.0–0.1)
EOS ABS: 0.2 10*3/uL (ref 0.0–0.7)
Eosinophils Relative: 3 %
HEMATOCRIT: 38.4 % (ref 36.0–46.0)
HEMOGLOBIN: 12.7 g/dL (ref 12.0–15.0)
LYMPHS ABS: 0.9 10*3/uL (ref 0.7–4.0)
Lymphocytes Relative: 12 %
MCH: 28.9 pg (ref 26.0–34.0)
MCHC: 33.1 g/dL (ref 30.0–36.0)
MCV: 87.5 fL (ref 78.0–100.0)
Monocytes Absolute: 0.7 10*3/uL (ref 0.1–1.0)
Monocytes Relative: 10 %
NEUTROS ABS: 5.2 10*3/uL (ref 1.7–7.7)
NEUTROS PCT: 74 %
Platelets: 270 10*3/uL (ref 150–400)
RBC: 4.39 MIL/uL (ref 3.87–5.11)
RDW: 12.8 % (ref 11.5–15.5)
WBC: 7 10*3/uL (ref 4.0–10.5)

## 2016-08-09 LAB — TSH: TSH: 0.55 u[IU]/mL (ref 0.350–4.500)

## 2016-08-09 LAB — MAGNESIUM: Magnesium: 1.4 mg/dL — ABNORMAL LOW (ref 1.7–2.4)

## 2016-08-09 LAB — I-STAT TROPONIN, ED: TROPONIN I, POC: 0 ng/mL (ref 0.00–0.08)

## 2016-08-09 LAB — GLUCOSE, CAPILLARY: Glucose-Capillary: 452 mg/dL — ABNORMAL HIGH (ref 65–99)

## 2016-08-09 LAB — PROCALCITONIN

## 2016-08-09 LAB — BILIRUBIN, FRACTIONATED(TOT/DIR/INDIR)
BILIRUBIN INDIRECT: 0.3 mg/dL (ref 0.3–0.9)
Bilirubin, Direct: 0.1 mg/dL (ref 0.1–0.5)
Total Bilirubin: 0.4 mg/dL (ref 0.3–1.2)

## 2016-08-09 MED ORDER — ONDANSETRON HCL 4 MG/2ML IJ SOLN: 4.0000 mg | Freq: Four times a day (QID) | INTRAMUSCULAR | Status: DC | PRN

## 2016-08-09 MED ORDER — NONFORMULARY OR COMPOUNDED ITEM: 2.0000 mg | Freq: Every day | Status: DC

## 2016-08-09 MED ORDER — DEXTROSE 5 % IV SOLN
500.0000 mg | INTRAVENOUS | Status: DC
  Filled 2016-08-09: qty 500

## 2016-08-09 MED ORDER — DEXTROSE 5 % IV SOLN
1.0000 g | Freq: Every day | INTRAVENOUS | Status: DC
  Filled 2016-08-09: qty 10

## 2016-08-09 MED ORDER — ACETAMINOPHEN 325 MG PO TABS: 650.0000 mg | ORAL_TABLET | Freq: Four times a day (QID) | ORAL | Status: DC | PRN

## 2016-08-09 MED ORDER — PREDNISONE 20 MG PO TABS
40.0000 mg | ORAL_TABLET | Freq: Every day | ORAL | Status: DC
  Administered 2016-08-10: 40 mg via ORAL
  Filled 2016-08-09: qty 2

## 2016-08-09 MED ORDER — INSULIN ASPART PROT & ASPART (70-30 MIX) 100 UNIT/ML ~~LOC~~ SUSP
6.0000 [IU] | Freq: Every day | SUBCUTANEOUS | Status: DC
  Administered 2016-08-10: 6 [IU] via SUBCUTANEOUS
  Filled 2016-08-09: qty 10

## 2016-08-09 MED ORDER — IPRATROPIUM BROMIDE 0.02 % IN SOLN
0.5000 mg | Freq: Four times a day (QID) | RESPIRATORY_TRACT | Status: DC
  Administered 2016-08-09: 0.5 mg via RESPIRATORY_TRACT
  Filled 2016-08-09: qty 2.5

## 2016-08-09 MED ORDER — ENOXAPARIN SODIUM 40 MG/0.4ML ~~LOC~~ SOLN
40.0000 mg | Freq: Every day | SUBCUTANEOUS | Status: DC
  Administered 2016-08-09: 40 mg via SUBCUTANEOUS
  Filled 2016-08-09: qty 0.4

## 2016-08-09 MED ORDER — METHYLPREDNISOLONE SODIUM SUCC 125 MG IJ SOLR
125.0000 mg | Freq: Once | INTRAMUSCULAR | Status: AC
  Administered 2016-08-09: 125 mg via INTRAVENOUS
  Filled 2016-08-09: qty 2

## 2016-08-09 MED ORDER — ACETAMINOPHEN 650 MG RE SUPP: 650.0000 mg | Freq: Four times a day (QID) | RECTAL | Status: DC | PRN

## 2016-08-09 MED ORDER — ALBUTEROL SULFATE (2.5 MG/3ML) 0.083% IN NEBU: 2.5000 mg | INHALATION_SOLUTION | RESPIRATORY_TRACT | Status: DC | PRN

## 2016-08-09 MED ORDER — IPRATROPIUM BROMIDE 0.02 % IN SOLN
0.5000 mg | Freq: Once | RESPIRATORY_TRACT | Status: AC
  Administered 2016-08-09: 0.5 mg via RESPIRATORY_TRACT
  Filled 2016-08-09: qty 2.5

## 2016-08-09 MED ORDER — DILTIAZEM HCL 100 MG IV SOLR
5.0000 mg/h | Freq: Once | INTRAVENOUS | Status: DC
  Filled 2016-08-09: qty 100

## 2016-08-09 MED ORDER — INSULIN ASPART 100 UNIT/ML ~~LOC~~ SOLN
0.0000 [IU] | Freq: Three times a day (TID) | SUBCUTANEOUS | Status: DC
  Administered 2016-08-10: 2 [IU] via SUBCUTANEOUS

## 2016-08-09 MED ORDER — SODIUM CHLORIDE 0.9 % IV BOLUS (SEPSIS)
500.0000 mL | Freq: Once | INTRAVENOUS | Status: AC
  Administered 2016-08-09: 500 mL via INTRAVENOUS

## 2016-08-09 MED ORDER — ASPIRIN EC 81 MG PO TBEC
81.0000 mg | DELAYED_RELEASE_TABLET | Freq: Every day | ORAL | Status: DC
  Administered 2016-08-10: 81 mg via ORAL
  Filled 2016-08-09: qty 1

## 2016-08-09 MED ORDER — DILTIAZEM HCL-DEXTROSE 100-5 MG/100ML-% IV SOLN (PREMIX)
INTRAVENOUS | Status: AC
  Administered 2016-08-09: 5 mg/h
  Filled 2016-08-09: qty 100

## 2016-08-09 MED ORDER — AZITHROMYCIN 250 MG PO TABS
500.0000 mg | ORAL_TABLET | Freq: Once | ORAL | Status: AC
Start: 2016-08-09 — End: 2016-08-09
  Administered 2016-08-09: 500 mg via ORAL
  Filled 2016-08-09: qty 2

## 2016-08-09 MED ORDER — FERROUS SULFATE 325 (65 FE) MG PO TABS
325.0000 mg | ORAL_TABLET | Freq: Every day | ORAL | Status: DC
  Administered 2016-08-10: 325 mg via ORAL
  Filled 2016-08-09: qty 1

## 2016-08-09 MED ORDER — INSULIN ASPART 100 UNIT/ML ~~LOC~~ SOLN: 0.0000 [IU] | Freq: Every day | SUBCUTANEOUS | Status: DC

## 2016-08-09 MED ORDER — SODIUM CHLORIDE 0.9 % IV BOLUS (SEPSIS)
1000.0000 mL | Freq: Once | INTRAVENOUS | Status: AC
  Administered 2016-08-09: 1000 mL via INTRAVENOUS

## 2016-08-09 MED ORDER — CEFTRIAXONE SODIUM 1 G IJ SOLR
1.0000 g | Freq: Once | INTRAMUSCULAR | Status: AC
  Administered 2016-08-09: 1 g via INTRAVENOUS
  Filled 2016-08-09: qty 10

## 2016-08-09 MED ORDER — MOMETASONE FURO-FORMOTEROL FUM 200-5 MCG/ACT IN AERO
2.0000 | INHALATION_SPRAY | Freq: Two times a day (BID) | RESPIRATORY_TRACT | Status: DC
  Administered 2016-08-09 – 2016-08-10 (×2): 2 via RESPIRATORY_TRACT
  Filled 2016-08-09: qty 8.8

## 2016-08-09 MED ORDER — ONDANSETRON HCL 4 MG PO TABS: 4.0000 mg | ORAL_TABLET | Freq: Four times a day (QID) | ORAL | Status: DC | PRN

## 2016-08-09 MED ORDER — PANTOPRAZOLE SODIUM 40 MG PO TBEC
40.0000 mg | DELAYED_RELEASE_TABLET | Freq: Two times a day (BID) | ORAL | Status: DC
  Administered 2016-08-10: 40 mg via ORAL
  Filled 2016-08-09: qty 1

## 2016-08-09 MED ORDER — ALBUTEROL (5 MG/ML) CONTINUOUS INHALATION SOLN
10.0000 mg/h | INHALATION_SOLUTION | RESPIRATORY_TRACT | Status: DC
  Administered 2016-08-09: 10 mg/h via RESPIRATORY_TRACT
  Filled 2016-08-09: qty 20

## 2016-08-09 MED ORDER — SULFAMETHOXAZOLE-TRIMETHOPRIM 800-160 MG PO TABS
1.0000 | ORAL_TABLET | Freq: Every day | ORAL | Status: DC
  Administered 2016-08-09: 1 via ORAL
  Filled 2016-08-09: qty 1

## 2016-08-09 NOTE — ED Notes (Signed)
Bed: [id number] Expected date:  Expected time:  Means of arrival:  Comments: Hold for B

## 2016-08-09 NOTE — ED Notes (Signed)
Pt. Unable to urinate at this time. Will collect urine when pt. Voids. Nurse aware.  

## 2016-08-09 NOTE — ED Notes (Signed)
Dr. Ralene Bathe has just seen her and pt. Is now found to be in sinus tach. Without ectopy. Repeat EKG performed by Anneah, and is given to Dr. Ralene Bathe per her order. Pt. Is in no distress. During her treatment, pt. Has had a couple of strong coughing episodes. I assure pt. And her family (daughter) that this is to be expected, and is actually laudatory.

## 2016-08-09 NOTE — ED Triage Notes (Signed)
Pt reports SOB and cough onset of Friday. Pt has COPD and husband believes pt has pneumonia. Pt heart rate 160. Denies any chest pain.

## 2016-08-09 NOTE — ED Notes (Signed)
Rees,MD at bedside.  

## 2016-08-09 NOTE — Progress Notes (Signed)
Pharmacy Antibiotic Note  Amanda [name] is a 75 y.o. female with hx of COPD FEVI 37%, IDDM, recurrent esophageal stricture, HTN and pAF c/o cough and dyspnea admitted on 08/09/2016 with pneumonia.  Pharmacy has been consulted for rocephin/zmax dosing.  Plan: Rocephin 1 Gm IV q24h Zmax 500 mg IV q24h Rx will sign off as no further adjustments are anticipated  Height: 5\' 1"  (154.9 cm) Weight: 115 lb 4.8 oz (52.3 kg) IBW/kg (Calculated) : 47.8  Temp (24hrs), Avg:99.4 F (37.4 C), Min:98.6 F (37 C), Max:100.4 F (38 C)   Recent Labs Lab 08/09/16 1744 08/09/16 2049 08/09/16 2122  WBC 7.0  --   --   CREATININE 1.24*  --   --   LATICACIDVEN  --  3.13* 3.6*    Estimated Creatinine Clearance: 28.7 mL/min (by C-G formula based on SCr of 1.24 mg/dL (H)).    No Known Allergies  Antimicrobials this admission: 1/7 rocephin >>  1/7 zmax >>   Dose adjustments this admission:   Microbiology results:  BCx:   UCx:    Sputum:    MRSA PCR:   Thank you for allowing pharmacy to be a part of this patient's care.  Dorrene German 08/09/2016 10:37 PM

## 2016-08-09 NOTE — H&P (Signed)
History and Physical  Patient Name: Amanda [name]     [id number]    DOB: 1941-07-11    DOA: 08/09/2016 PCP: Tamsen Roers, MD   Patient coming from: Home  Chief Complaint: Cough, dyspnea  HPI: Amanda [name] is a 76 y.o. 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  The patient was in her usual state of health until about 2 days ago when she started to develop increased cough and shortness of breath. She's had no fever, sputum production, chest pain, pleuritic pain, nausea or vomiting, abdominal pain, dysuria, urinary frequency. She does have a history of frequent urinary tract infections for which she was started 2 weeks ago on daily Bactrim, and has urology follow-up pending.  She has a daughter who is also sick with an upper respiratory infection, but the patient had no body aches, sore throat, sweats, fevers. Today her dyspnea got much worse so she came to the emergency room. She is adherent to Advair, but had not tried using her home rescue inhaler.  ED course: -Temp 100.4 F, heart rate 157 in A. fib, respirations 24/m, pulse oximetry low 90s on room air, blood pressure 192/83 -Na 126, K 5.6, Cr 1.24 (baseline 1.0), WBC 7K, Hgb 12.7, total bilirubin 1.7 -Troponin negative -Lactic acid 3.13 -Chest x-ray showed no focal opacity or edema -ECG showed atrial fibrillation with rate 160 initially, before the patient was placed on diltiazem, she converted to sinus rhythm with rate 120s -She was given 1.5 L normal saline, continuous albuterol nebulizer (after which she felt much better), empiric ceftriaxone and azithromycin, and Solu-Medrol 125 mg IV and TRH were asked to admit for COPD flare, possible pneumonia sepsis    The patient was referred to [facility] in the last 6 onths for recurrent esophageal strictures, has been  dilated in the last 2 months there, no problems since, is on a budesonide slurry and PPI BID.  Etiology I think is not yet clear per GI notes.  The patient did have an episode of brief Afib years ago in the context of another illness.  She had a follow up outpatient monitoring that showed intermittent periods of Afib and Dr. Tamala Julian of [facility] recommended anticoagulation based on her [place] 5, but she declined.    ROS: Review of Systems  Constitutional: Negative for chills, fever and malaise/fatigue.  HENT: Negative for congestion and sore throat.   Respiratory: Positive for cough, hemoptysis, shortness of breath and wheezing. Negative for sputum production.   Cardiovascular: Negative for chest pain, palpitations and leg swelling.  Gastrointestinal: Negative for abdominal pain, blood in stool, constipation, diarrhea, melena, nausea and vomiting.  Genitourinary: Negative for dysuria, frequency and urgency.  All other systems reviewed and are negative.         Past Medical History:  Diagnosis Date  . Arrhythmia    7-23-15x1beginning of colon surgery 01-12-14 - no problems since.  . Cancer ([place])    colon cancer 01-12-14- no further tx. intended  . Candida esophagitis ([place]) 01/09/2014  . Closed fracture of unspecified part of upper end of humerus   . Complete rupture of rotator cuff    right shoulder- limited range of motion  . COPD (chronic obstructive pulmonary disease) ([place])   . Emphysema lung ([place])   . Esophageal stricture 12/28/2013  . Essential hypertension, benign  off lisinporil for last 2 months  . GERD (gastroesophageal reflux disease)   . HCAP (healthcare-associated pneumonia) 03/28/2014  . Hx of adenomatous colonic polyps 05/15/2015  . Hyperlipidemia   . Neurogenic bladder, NOS    02-22-14 some bladder issues of urgency is somewhat improve.  . Other vitamin B12 deficiency anemia   . Type II or unspecified type diabetes mellitus without mention of complication,  uncontrolled    diet controlled  . Vitamin D deficiency     Past Surgical History:  Procedure Laterality Date  . BALLOON DILATION N/A 03/02/2014   Procedure: BALLOON DILATION;  Surgeon: Gatha Mayer, MD;  Location: [facility];  Service: Endoscopy;  Laterality: N/A;  . CATARACT EXTRACTION, BILATERAL Bilateral   . COLON SURGERY    . COLONOSCOPY N/A 01/10/2014   Procedure: COLONOSCOPY;  Surgeon: [name], MD;  Location: [facility];  Service: Endoscopy;  Laterality: N/A;  . DILATION AND CURETTAGE OF UTERUS    . ESOPHAGOGASTRODUODENOSCOPY N/A 01/16/2014   Procedure: ESOPHAGOGASTRODUODENOSCOPY (EGD);  Surgeon: Gatha Mayer, MD;  Location: [facility];  Service: Endoscopy;  Laterality: N/A;  . ESOPHAGOGASTRODUODENOSCOPY N/A 03/02/2014   Procedure: ESOPHAGOGASTRODUODENOSCOPY (EGD);  Surgeon: Gatha Mayer, MD;  Location: [facility];  Service: Endoscopy;  Laterality: N/A;  . ESOPHAGOGASTRODUODENOSCOPY N/A 08/21/2015   Procedure: ESOPHAGOGASTRODUODENOSCOPY (EGD);  Surgeon: Gatha Mayer, MD;  Location: [facility];  Service: Endoscopy;  Laterality: N/A;  . ESOPHAGOGASTRODUODENOSCOPY (EGD) WITH PROPOFOL N/A 01/09/2014   Procedure: ESOPHAGOGASTRODUODENOSCOPY (EGD) WITH PROPOFOL;  Surgeon: [name], MD;  Location: [facility];  Service: Endoscopy;  Laterality: N/A;  . ESOPHAGOGASTRODUODENOSCOPY (EGD) WITH PROPOFOL N/A 01/19/2014   Procedure: ESOPHAGOGASTRODUODENOSCOPY (EGD) WITH PROPOFOL;  Surgeon: Gatha Mayer, MD;  Location: [facility];  Service: Endoscopy;  Laterality: N/A;  . EYE SURGERY Bilateral 2010   both eyes lens replacments  . LAPAROSCOPIC PARTIAL COLECTOMY N/A 01/12/2014   Procedure: LAPAROSCOPIC ASSISTED PARTIAL COLECTOMY AND REMOVAL OF RECTAL POLYP;  Surgeon: Odis Hollingshead, MD;  Location: [facility];  Service: General;  Laterality: N/A;  . ORIF SHOULDER FRACTURE Right 2011   arthroplasty  . SAVORY DILATION N/A 08/21/2015   Procedure: SAVORY DILATION;  Surgeon:  Gatha Mayer, MD;  Location: [facility];  Service: Endoscopy;  Laterality: N/A;  . TONSILLECTOMY AND ADENOIDECTOMY  age 34 or 3    Social History: Patient lives With her husband.  The patient walks unassisted. She is a former smoker.    No Known Allergies  Family history: family history includes Bone cancer in her father; Diabetes in her father; Emphysema in her mother; Pneumonia in her mother.  Prior to Admission medications   Medication Sig Start Date End Date Taking? Authorizing Provider  aspirin EC 81 MG EC tablet Take 1 tablet (81 mg total) by mouth daily. 11/09/14  Yes Belva Crome, MD  ferrous sulfate 325 (65 FE) MG tablet Take 1 tablet (325 mg total) by mouth daily. 01/01/14  Yes Drenda Freeze, MD  Fluticasone-Salmeterol (ADVAIR DISKUS) 250-50 MCG/DOSE AEPB Inhale 1 puff into the lungs 2 (two) times daily. 04/04/14  Yes Juanito Doom, MD  insulin NPH-regular Human (NOVOLIN 70/30) (70-30) 100 UNIT/ML injection Inject 6 Units into the skin daily with breakfast.   Yes Historical Provider, MD  levalbuterol (XOPENEX HFA) 45 MCG/ACT inhaler Inhale 2 puffs into the lungs every 6 (six) hours as needed for wheezing or shortness of breath. 02/22/15  Yes Juanito Doom, MD  NONFORMULARY OR COMPOUNDED ITEM Take  2 mg by mouth at bedtime. Budesonide viscous 1mg /34ml oral suspension.  Take 16 ml (2mg ) by mouth every night at bedtime. Take nothing by mouth for 1 hour after dose.  [employer] Compounding [phone].   Yes Historical Provider, MD  omeprazole (PRILOSEC) 40 MG capsule Take 1 capsule (40 mg total) by mouth 2 (two) times daily before a meal. 30 mins before Breakfast and supper 07/12/15  Yes Gatha Mayer, MD  phenazopyridine (PYRIDIUM) 95 MG tablet Take 95 mg by mouth 3 (three) times daily as needed for pain. OTC   Yes Historical Provider, MD  sulfamethoxazole-trimethoprim (BACTRIM DS,SEPTRA DS) 800-160 MG tablet Take 1 tablet by mouth at bedtime.   Yes Historical Provider, MD    Vitamin D, Ergocalciferol, (DRISDOL) 50000 units CAPS capsule Take 50,000 Units by mouth every 7 (seven) days. ON FRIDAYS   Yes Historical Provider, MD  oxybutynin (DITROPAN) 5 MG tablet Take 5 mg by mouth 2 (two) times daily. 01/23/16   Historical Provider, MD       Physical Exam: BP 139/55   Pulse (!) 131   Temp 99.2 F (37.3 C) (Oral)   Resp 22   Ht 5\' 1"  (1.549 m)   Wt 51.7 kg (114 lb)   SpO2 99%   BMI 21.54 kg/m  General appearance: Thin elderly adult female, alert and in moderate distress from dyspnea.   Eyes: Anicteric, conjunctiva pink, lids and lashes normal. PERRL.    ENT: No nasal deformity, discharge, epistaxis.  Hearing normal. OP moist without lesions.  Upper dentures plate. Neck: No neck masses.  Trachea midline.  No thyromegaly/tenderness. Lymph: No cervical or supraclavicular lymphadenopathy. Skin: Warm and dry.  No jaundice.  No suspicious rashes or lesions. Cardiac: Tachcyardic, now regular, sinus on monitor, nl S1-S2, no murmurs appreciated.  Capillary refill is brisk.  JVP normal.  No LE edema.  Radial pulses 2+ and symmetric.  DP pulses diminished. Respiratory: Tachypnea, diminished lung sounds at bases, wheezes bilatearlly. Abdomen: Abdomen soft.  No TTP. No ascites, distension, hepatosplenomegaly.   MSK: No deformities or effusions.  No cyanosis or clubbing. Neuro: Cranial nerves 3-12 intact.  Sensation intact to light touch. Speech is fluent.  Muscle strength 5/5 and symmetric.    Psych: Sensorium intact and responding to questions, attention normal.  Behavior appropriate.  Affect normal, appears tired from dyspnea.  Judgment and insight appear normal.     Labs on Admission:  I have personally reviewed following labs and imaging studies: CBC:  Recent Labs Lab 08/09/16 1744  WBC 7.0  NEUTROABS 5.2  HGB 12.7  HCT 38.4  MCV 87.5  PLT [id number]   Basic Metabolic Panel:  Recent Labs Lab 08/09/16 1744  NA 126*  K 5.6*  CL 93*  CO2 24  GLUCOSE 133*   BUN 16  CREATININE 1.24*  CALCIUM 9.0   GFR: Estimated Creatinine Clearance: 28.7 mL/min (by C-G formula based on SCr of 1.24 mg/dL (H)).  Liver Function Tests:  Recent Labs Lab 08/09/16 1744  AST 40  ALT 13*  ALKPHOS 71  BILITOT 1.7*  PROT 7.7  ALBUMIN 4.3   No results for input(s): LIPASE, AMYLASE in the last 168 hours. No results for input(s): AMMONIA in the last 168 hours. Coagulation Profile: No results for input(s): INR, PROTIME in the last 168 hours. Cardiac Enzymes: No results for input(s): CKTOTAL, CKMB, CKMBINDEX, TROPONINI in the last 168 hours. BNP (last 3 results) No results for input(s): PROBNP in the last 8760 hours. HbA1C:  No results for input(s): HGBA1C in the last 72 hours. CBG:  Recent Labs Lab 08/09/16 1718  GLUCAP 159*   Lipid Profile: No results for input(s): CHOL, HDL, LDLCALC, TRIG, CHOLHDL, LDLDIRECT in the last 72 hours. Thyroid Function Tests: No results for input(s): TSH, T4TOTAL, FREET4, T3FREE, THYROIDAB in the last 72 hours. Anemia Panel: No results for input(s): VITAMINB12, FOLATE, FERRITIN, TIBC, IRON, RETICCTPCT in the last 72 hours. Sepsis Labs: Lactic acid 3.13 Invalid input(s): PROCALCITONIN, LACTICIDVEN No results found for this or any previous visit (from the past 240 hour(s)).       Radiological Exams on Admission: Personally reviewed CXR shows emphysema, no focal opacities: Dg Chest Port 1 View  Result Date: 08/09/2016 CLINICAL DATA:  SOB, congestion and cough x 3 days; hx COPD; EXAM: PORTABLE CHEST 1 VIEW COMPARISON:  10/25/2014 FINDINGS: Cardiac silhouette is normal in size. No mediastinal or hilar masses. Right upper lobe scarring with retraction of the right hilum superiorly is stable. There is milder left upper lobe scar ring. Lungs are hyperexpanded, but otherwise clear. No pleural effusion.  No pneumothorax. Right shoulder prosthesis is stable. Skeletal structures are diffusely demineralized. IMPRESSION: No acute  cardiopulmonary disease. Electronically Signed   By: Lajean Manes M.D.   On: 08/09/2016 17:36    EKG: Independently reviewed. Rate 160 initially and irregular.  Repeat ECG shows rate 127 and sinus rhythm, no ST changes.    Assessment/Plan  1. COPD exacerbation:  Chronic COPD with new dyspnea, cough and wheezing with normal CXR.  -Prednisone 40 mg daily for 5 days -Xopenex scheduled with ipratropium -Albuterol when necessary -Continue Advair    2. Possible sepsis from pneumonia:  Suspected source pneumonia, urinalysis is not back yet, but focal symptoms to lungs only. Organism unknown.   Patient meets criteria given tachycardia, tachypnea, fever, and evidence of organ dysfunction.  Lactate 3.13 mmol/L and repeat ordered within 6 hours.  This patient is not at high risk of poor outcomes with a qSOFA score of 1.  Antibiotics delivered in the ED.    -Sepsis bundle utilized:  -Blood and urine cultures drawn  -1.5L bolus given in ED, will repeat lactic acid  -Start targeted antibiotics with ceftriaxone and azithromycin, based on suspected source of infection    -Repeat renal function and complete blood count in AM  -Code SEPSIS called to E-link  -Repeat CXR 2V tomorrow after IV fluids  -Check procalcitonin and RVP and influenza PCR and de-escalate as able   3. Hyperkalemia:  Mild. -IVF and trend BMP  4. Hyponatremia:  Mild, likely hypovolemic.   -Check urine electrolytes, urine/serum Osms  5. Hyperbilirubinemia:  Unclear etiology.   Abdomen exam benign. -Check fractionated bili and trend LFTs  6. Atrial fibrillation with RVR initially:  CHADS2-VASc 5, patient has refused anticoagulation in the past. Also is not on rate control agent at present. Currently in sinus rhythm. -Monitor on telemetry  7. Recurrent UTI:  -Continue Bactrim -Follow-up urinalysis and urine culture  8. Insulin-dependent diabetes: -Continue home 70/30 -SSI with meals  9.  Hypertension: Hypertensive admission. Currently on blood pressure medicines. Hypertension mostly resolved with fluids, sinus rhythm, CAT. -Monitor BP  10. Other medications: -Hold oxybutynin -Continue iron -Continue aspirin     DVT prophylaxis: Lovenox  Code Status: FULL  Family Communication: Daughter, son-in-law and husband at bedside. Overnight plan discussed.  CODE STATUS confirmed.   Disposition Plan: Anticipate IV fluids and steroids and bronchodilators.  Empiric abx for pneumonia sepsis for now, monitor clinical status  and procalc/repeat CXR/flu swab and de-escalate antibiotics as able Consults called: None Admission status: INPATIENT, tele   Medical decision making: Patient seen at 9:10 PM on 08/09/2016.  The patient was discussed with Dr. Ralene Bathe.  What exists of the patient's chart was reviewed in depth and summarized above.  Clinical condition: converted to sinus rhythm, getting additional fluids, respiratory status improving, stable on nasal cannula, apropriate for telemetry floor.        Edwin Dada [facility] Pager [phone]     At the time of admission, it appears that the appropriate admission status for this patient is INPATIENT. This is judged to be reasonable and necessary in order to provide the required intensity of service to ensure the patient's safety given the presenting symptoms, physical exam findings, and initial radiographic and laboratory data in the context of their chronic comorbidities.  Together, these circumstances are felt to place her at high risk for further clinical deterioration threatening life, limb, or organ.   Patient requires inpatient status due to high intensity of service, high risk for further deterioration and high frequency of surveillance required because of this severe exacerbation of their chronic organ failure.  Factors supporting inpatient status: Presentation in respiratory distress, respirations >20 per  minute Atrial fibrillation with RVR Elevated lactic acid to 3.13 mmmol/L with supsected pneumonia Elevatd bilirubin, potassium and hyponatremia All in setting of insulin dependent diabetes, severe COPD with FEV1 37% baseline, and advanced age  I certify that at the point of admission it is my clinical judgment that the patient will require inpatient hospital care spanning beyond 2 midnights from the point of admission and that early discharge would result in unnecessary risk of decompensation and readmission or threat to life, limb or bodily function.

## 2016-08-09 NOTE — ED Notes (Signed)
RT called to admin cont neb

## 2016-08-09 NOTE — ED Provider Notes (Signed)
[facility] DEPT Provider Note   CSN: [id number] Arrival date & time: 08/09/16  1658     History   Chief Complaint Chief Complaint  Patient presents with  . Shortness of Breath    HPI Amanda [name] is a 76 y.o. female.  The history is provided by the patient. No language interpreter was used.  Shortness of Breath   Amanda [name] is a 76 y.o. female who presents to the Emergency Department complaining of Shortness breath. 2 days ago she developed cough with increased shortness of breath. No fevers, chest pain, hemoptysis, abdominal pain, nausea, vomiting, leg swelling or pain. Symptoms are similar to prior pneumonias. Symptoms are severe, constant worsening.  Past Medical History:  Diagnosis Date  . Arrhythmia    7-23-15x1beginning of colon surgery 01-12-14 - no problems since.  . Cancer ([place])    colon cancer 01-12-14- no further tx. intended  . Candida esophagitis ([place]) 01/09/2014  . Closed fracture of unspecified part of upper end of humerus   . Complete rupture of rotator cuff    right shoulder- limited range of motion  . COPD (chronic obstructive pulmonary disease) ([place])   . Emphysema lung ([place])   . Esophageal stricture 12/28/2013  . Essential hypertension, benign    off lisinporil for last 2 months  . GERD (gastroesophageal reflux disease)   . HCAP (healthcare-associated pneumonia) 03/28/2014  . Hx of adenomatous colonic polyps 05/15/2015  . Hyperlipidemia   . Neurogenic bladder, NOS    02-22-14 some bladder issues of urgency is somewhat improve.  . Other vitamin B12 deficiency anemia   . Type II or unspecified type diabetes mellitus without mention of complication, uncontrolled    diet controlled  . Vitamin D deficiency     Patient Active Problem List   Diagnosis Date Noted  . COPD exacerbation ([place]) 08/09/2016  . Community acquired pneumonia 08/09/2016  . Hyponatremia 08/09/2016  . Hyperkalemia 08/09/2016  . Hyperbilirubinemia 08/09/2016  . Esophageal stricture    . Hx of adenomatous colonic polyps 05/15/2015  . Chronic respiratory failure with hypoxia ([place]) 05/18/2014  . Sepsis, unspecified organism ([place]) 03/28/2014  . Atrial fibrillation with RVR ([place]) 03/28/2014  . Right colon cancer (Stage I) s/p partial colectomy 01/12/14 01/10/2014  . Stricture and stenosis of esophagus 01/09/2014  . COPD (chronic obstructive pulmonary disease) ([place]) 01/08/2014  . GERD (gastroesophageal reflux disease)   . Hyperlipidemia   . Type 2 diabetes mellitus with complication, with long-term current use of insulin ([place])   . Essential hypertension, benign     Past Surgical History:  Procedure Laterality Date  . BALLOON DILATION N/A 03/02/2014   Procedure: BALLOON DILATION;  Surgeon: Gatha Mayer, MD;  Location: [facility] ENDOSCOPY;  Service: Endoscopy;  Laterality: N/A;  . CATARACT EXTRACTION, BILATERAL Bilateral   . COLON SURGERY    . COLONOSCOPY N/A 01/10/2014   Procedure: COLONOSCOPY;  Surgeon: [name], MD;  Location: [facility] ENDOSCOPY;  Service: Endoscopy;  Laterality: N/A;  . DILATION AND CURETTAGE OF UTERUS    . ESOPHAGOGASTRODUODENOSCOPY N/A 01/16/2014   Procedure: ESOPHAGOGASTRODUODENOSCOPY (EGD);  Surgeon: Gatha Mayer, MD;  Location: [facility];  Service: Endoscopy;  Laterality: N/A;  . ESOPHAGOGASTRODUODENOSCOPY N/A 03/02/2014   Procedure: ESOPHAGOGASTRODUODENOSCOPY (EGD);  Surgeon: Gatha Mayer, MD;  Location: [facility];  Service: Endoscopy;  Laterality: N/A;  . ESOPHAGOGASTRODUODENOSCOPY N/A 08/21/2015   Procedure: ESOPHAGOGASTRODUODENOSCOPY (EGD);  Surgeon: Gatha Mayer, MD;  Location: [facility];  Service: Endoscopy;  Laterality: N/A;  . ESOPHAGOGASTRODUODENOSCOPY (EGD) WITH  PROPOFOL N/A 01/09/2014   Procedure: ESOPHAGOGASTRODUODENOSCOPY (EGD) WITH PROPOFOL;  Surgeon: [name], MD;  Location: [facility];  Service: Endoscopy;  Laterality: N/A;  . ESOPHAGOGASTRODUODENOSCOPY (EGD) WITH PROPOFOL N/A 01/19/2014   Procedure:  ESOPHAGOGASTRODUODENOSCOPY (EGD) WITH PROPOFOL;  Surgeon: Gatha Mayer, MD;  Location: [facility];  Service: Endoscopy;  Laterality: N/A;  . EYE SURGERY Bilateral 2010   both eyes lens replacments  . LAPAROSCOPIC PARTIAL COLECTOMY N/A 01/12/2014   Procedure: LAPAROSCOPIC ASSISTED PARTIAL COLECTOMY AND REMOVAL OF RECTAL POLYP;  Surgeon: Odis Hollingshead, MD;  Location: [facility] ORS;  Service: General;  Laterality: N/A;  . ORIF SHOULDER FRACTURE Right 2011   arthroplasty  . SAVORY DILATION N/A 08/21/2015   Procedure: SAVORY DILATION;  Surgeon: Gatha Mayer, MD;  Location: [facility];  Service: Endoscopy;  Laterality: N/A;  . TONSILLECTOMY AND ADENOIDECTOMY  age 76 or 3    OB History    No data available       Home Medications    Prior to Admission medications   Medication Sig Start Date End Date Taking? Authorizing Provider  aspirin EC 81 MG EC tablet Take 1 tablet (81 mg total) by mouth daily. 11/09/14  Yes Belva Crome, MD  ferrous sulfate 325 (65 FE) MG tablet Take 1 tablet (325 mg total) by mouth daily. 01/01/14  Yes Drenda Freeze, MD  Fluticasone-Salmeterol (ADVAIR DISKUS) 250-50 MCG/DOSE AEPB Inhale 1 puff into the lungs 2 (two) times daily. 04/04/14  Yes Juanito Doom, MD  insulin NPH-regular Human (NOVOLIN 70/30) (70-30) 100 UNIT/ML injection Inject 6 Units into the skin daily with breakfast.   Yes Historical Provider, MD  levalbuterol (XOPENEX HFA) 45 MCG/ACT inhaler Inhale 2 puffs into the lungs every 6 (six) hours as needed for wheezing or shortness of breath. 02/22/15  Yes Juanito Doom, MD  NONFORMULARY OR COMPOUNDED ITEM Take 2 mg by mouth at bedtime. Budesonide viscous 1mg /19ml oral suspension.  Take 16 ml (2mg ) by mouth every night at bedtime. Take nothing by mouth for 1 hour after dose.  [facility] [phone].   Yes Historical Provider, MD  omeprazole (PRILOSEC) 40 MG capsule Take 1 capsule (40 mg total) by mouth 2 (two) times daily before a meal. 30  mins before Breakfast and supper 07/12/15  Yes Gatha Mayer, MD  phenazopyridine (PYRIDIUM) 95 MG tablet Take 95 mg by mouth 3 (three) times daily as needed for pain. OTC   Yes Historical Provider, MD  sulfamethoxazole-trimethoprim (BACTRIM DS,SEPTRA DS) 800-160 MG tablet Take 1 tablet by mouth at bedtime.   Yes Historical Provider, MD  Vitamin D, Ergocalciferol, (DRISDOL) 50000 units CAPS capsule Take 50,000 Units by mouth every 7 (seven) days. ON FRIDAYS   Yes Historical Provider, MD  oxybutynin (DITROPAN) 5 MG tablet Take 5 mg by mouth 2 (two) times daily. 01/23/16   Historical Provider, MD    Family History Family History  Problem Relation Age of Onset  . Diabetes Father   . Bone cancer Father     bone marrow  . Emphysema Mother   . Pneumonia Mother   . Colon cancer Neg Hx   . Esophageal cancer Neg Hx   . Stomach cancer Neg Hx     Social History Social History  Substance Use Topics  . Smoking status: Former Smoker    Packs/day: 1.00    Years: 20.00    Types: Cigarettes    Quit date: 08/03/2005  . Smokeless tobacco: Never Used  .  Alcohol use No     Allergies   Patient has no known allergies.   Review of Systems Review of Systems  Respiratory: Positive for shortness of breath.   All other systems reviewed and are negative.    Physical Exam Updated Vital Signs BP (!) 153/71 (BP Location: Right Arm)   Pulse (!) 109   Temp 98.6 F (37 C) (Oral)   Resp 20   Ht 5\' 1"  (1.549 m)   Wt 115 lb 4.8 oz (52.3 kg)   SpO2 93%   BMI 21.79 kg/m   Physical Exam  Constitutional: She is oriented to person, place, and time. She appears well-developed and well-nourished. She appears distressed.  HENT:  Head: Normocephalic and atraumatic.  Cardiovascular: Regular rhythm.   No murmur heard. Tachycardic  Pulmonary/Chest:  Tachypnea with decreased air movement bilateral bases. Upper lobes with end expiratory wheezes bilaterally. Accessory muscle use with prolonged expiratory  phase.  Abdominal: Soft. There is no tenderness. There is no rebound and no guarding.  Musculoskeletal: She exhibits no edema or tenderness.  Neurological: She is alert and oriented to person, place, and time.  Skin: Skin is warm and dry.  Psychiatric: She has a normal mood and affect. Her behavior is normal.  Nursing note and vitals reviewed.    ED Treatments / Results  Labs (all labs ordered are listed, but only abnormal results are displayed) Labs Reviewed  COMPREHENSIVE METABOLIC PANEL - Abnormal; Notable for the following:       Result Value   Sodium 126 (*)    Potassium 5.6 (*)    Chloride 93 (*)    Glucose, Bld 133 (*)    Creatinine, Ser 1.24 (*)    ALT 13 (*)    Total Bilirubin 1.7 (*)    GFR calc non Af Amer 41 (*)    GFR calc Af Amer 47 (*)    All other components within normal limits  URINALYSIS, ROUTINE W REFLEX MICROSCOPIC - Abnormal; Notable for the following:    Glucose, UA >=500 (*)    All other components within normal limits  LACTIC ACID, PLASMA - Abnormal; Notable for the following:    Lactic Acid, Venous 3.6 (*)    All other components within normal limits  LACTIC ACID, PLASMA - Abnormal; Notable for the following:    Lactic Acid, Venous 3.6 (*)    All other components within normal limits  MAGNESIUM - Abnormal; Notable for the following:    Magnesium 1.4 (*)    All other components within normal limits  GLUCOSE, CAPILLARY - Abnormal; Notable for the following:    Glucose-Capillary 452 (*)    All other components within normal limits  URINALYSIS, MICROSCOPIC (REFLEX) - Abnormal; Notable for the following:    Squamous Epithelial / LPF 0-5 (*)    All other components within normal limits  I-STAT CG4 LACTIC ACID, ED - Abnormal; Notable for the following:    Lactic Acid, Venous 3.13 (*)    All other components within normal limits  CBG MONITORING, ED - Abnormal; Notable for the following:    Glucose-Capillary 159 (*)    All other components within  normal limits  URINE CULTURE  CULTURE, BLOOD (ROUTINE X 2)  CULTURE, BLOOD (ROUTINE X 2)  CULTURE, EXPECTORATED SPUTUM-ASSESSMENT  GRAM STAIN  RESPIRATORY PANEL BY PCR  CBC WITH DIFFERENTIAL/PLATELET  PROCALCITONIN  BILIRUBIN, FRACTIONATED(TOT/DIR/INDIR)  TSH  STREP PNEUMONIAE URINARY ANTIGEN  CBC  COMPREHENSIVE METABOLIC PANEL  LEGIONELLA PNEUMOPHILA SEROGP 1 UR AG  SODIUM, URINE, RANDOM  OSMOLALITY  INFLUENZA PANEL BY PCR (TYPE A & B, H1N1)  OSMOLALITY, URINE  I-STAT TROPOININ, ED  I-STAT CG4 LACTIC ACID, ED  I-STAT TROPOININ, ED    EKG  EKG Interpretation None       Radiology Dg Chest Port 1 View  Result Date: 08/09/2016 CLINICAL DATA:  SOB, congestion and cough x 3 days; hx COPD; EXAM: PORTABLE CHEST 1 VIEW COMPARISON:  10/25/2014 FINDINGS: Cardiac silhouette is normal in size. No mediastinal or hilar masses. Right upper lobe scarring with retraction of the right hilum superiorly is stable. There is milder left upper lobe scar ring. Lungs are hyperexpanded, but otherwise clear. No pleural effusion.  No pneumothorax. Right shoulder prosthesis is stable. Skeletal structures are diffusely demineralized. IMPRESSION: No acute cardiopulmonary disease. Electronically Signed   By: Lajean Manes M.D.   On: 08/09/2016 17:36    Procedures Procedures (including critical care time) CRITICAL CARE Performed by: Quintella Reichert   Total critical care time: 35 minutes  Critical care time was exclusive of separately billable procedures and treating other patients.  Critical care was necessary to treat or prevent imminent or life-threatening deterioration.  Critical care was time spent personally by me on the following activities: development of treatment plan with patient and/or surrogate as well as nursing, discussions with consultants, evaluation of patient's response to treatment, examination of patient, obtaining history from patient or surrogate, ordering and performing  treatments and interventions, ordering and review of laboratory studies, ordering and review of radiographic studies, pulse oximetry and re-evaluation of patient's condition.  Medications Ordered in ED Medications  insulin aspart protamine- aspart (NOVOLOG MIX 70/30) injection 6 Units (not administered)  sulfamethoxazole-trimethoprim (BACTRIM DS,SEPTRA DS) 800-160 MG per tablet 1 tablet (1 tablet Oral Given 08/09/16 2345)  NONFORMULARY OR COMPOUNDED ITEM 2 mg (not administered)  pantoprazole (PROTONIX) EC tablet 40 mg (not administered)  aspirin EC tablet 81 mg (not administered)  mometasone-formoterol (DULERA) 200-5 MCG/ACT inhaler 2 puff (2 puffs Inhalation Given 08/09/16 2317)  ferrous sulfate tablet 325 mg (not administered)  insulin aspart (novoLOG) injection 0-9 Units (not administered)  insulin aspart (novoLOG) injection 0-5 Units (0 Units Subcutaneous Not Given 08/09/16 2349)  albuterol (PROVENTIL) (2.5 MG/3ML) 0.083% nebulizer solution 2.5 mg (not administered)  ondansetron (ZOFRAN) tablet 4 mg (not administered)    Or  ondansetron (ZOFRAN) injection 4 mg (not administered)  acetaminophen (TYLENOL) tablet 650 mg (not administered)    Or  acetaminophen (TYLENOL) suppository 650 mg (not administered)  enoxaparin (LOVENOX) injection 40 mg (40 mg Subcutaneous Given 08/09/16 2345)  predniSONE (DELTASONE) tablet 40 mg (not administered)  azithromycin (ZITHROMAX) 500 mg in dextrose 5 % 250 mL IVPB (not administered)  cefTRIAXone (ROCEPHIN) 1 g in dextrose 5 % 50 mL IVPB (not administered)  sodium chloride 0.9 % bolus 1,000 mL (1,000 mLs Intravenous Given 08/09/16 2348)  sodium chloride 0.9 % bolus 500 mL (0 mLs Intravenous Stopped 08/09/16 1913)  methylPREDNISolone sodium succinate (SOLU-MEDROL) 125 mg/2 mL injection 125 mg (125 mg Intravenous Given 08/09/16 1730)  ipratropium (ATROVENT) nebulizer solution 0.5 mg (0.5 mg Nebulization Given 08/09/16 1759)  dilTIAZem HCl-Dextrose 100-5 MG/100ML-%  infusion SOLN (  Stopped 08/09/16 2044)  sodium chloride 0.9 % bolus 1,000 mL (1,000 mLs Intravenous Transfusing/Transfer 08/09/16 2154)  cefTRIAXone (ROCEPHIN) 1 g in dextrose 5 % 50 mL IVPB (1 g Intravenous Transfusing/Transfer 08/09/16 2154)  azithromycin (ZITHROMAX) tablet 500 mg (500 mg Oral Given 08/09/16 2128)     Initial Impression / Assessment and  Plan / ED Course  I have reviewed the triage vital signs and the nursing notes.  Pertinent labs & imaging results that were available during my care of the patient were reviewed by me and considered in my medical decision making (see chart for details).  Clinical Course     Patient with history of COPD here with increasing shortness of breath and cough. On initial evaluation patient with significant tachycardia, respiratory distress. Respiratory distress improved after her Solu-Medrol, hour-long nebulizer treatment. Tachycardia with A. fib with RVR on initial evaluation. Cardizem drip was ordered but patient converted to sinus tach as soon as the Cardizem was started. The Cardizem drip was discontinued. Patient developed fever in the emergency department, she was started on antibiotics for potential can require pneumonia. Lactic acid was drawn after patient received hour-long nebulizer treatment and this was elevated. Lactic acidosis is thought to be secondary to albuterol administration and not secondary to sepsis. Hospitalist was contacted for admission for ongoing treatment.  Final Clinical Impressions(s) / ED Diagnoses   Final diagnoses:  Cough    New Prescriptions Current Discharge Medication List       Quintella Reichert, MD 08/10/16 0104

## 2016-08-10 ENCOUNTER — Inpatient Hospital Stay (HOSPITAL_COMMUNITY): Payer: MEDICARE

## 2016-08-10 DIAGNOSIS — J441 Chronic obstructive pulmonary disease with (acute) exacerbation: Secondary | ICD-10-CM | POA: Diagnosis not present

## 2016-08-10 DIAGNOSIS — A4189 Other specified sepsis: Secondary | ICD-10-CM | POA: Diagnosis not present

## 2016-08-10 DIAGNOSIS — J210 Acute bronchiolitis due to respiratory syncytial virus: Secondary | ICD-10-CM | POA: Diagnosis present

## 2016-08-10 LAB — COMPREHENSIVE METABOLIC PANEL
ALBUMIN: 3.5 g/dL (ref 3.5–5.0)
ALK PHOS: 58 U/L (ref 38–126)
ALT: 14 U/L (ref 14–54)
ANION GAP: 7 (ref 5–15)
AST: 25 U/L (ref 15–41)
BUN: 21 mg/dL — ABNORMAL HIGH (ref 6–20)
CALCIUM: 8.9 mg/dL (ref 8.9–10.3)
CHLORIDE: 103 mmol/L (ref 101–111)
CO2: 24 mmol/L (ref 22–32)
Creatinine, Ser: 1.19 mg/dL — ABNORMAL HIGH (ref 0.44–1.00)
GFR calc Af Amer: 50 mL/min — ABNORMAL LOW (ref >60)
GFR calc non Af Amer: 43 mL/min — ABNORMAL LOW (ref >60)
GLUCOSE: 237 mg/dL — AB (ref 65–99)
Potassium: 4.6 mmol/L (ref 3.5–5.1)
SODIUM: 134 mmol/L — AB (ref 135–145)
Total Bilirubin: 0.6 mg/dL (ref 0.3–1.2)
Total Protein: 6.4 g/dL — ABNORMAL LOW (ref 6.5–8.1)

## 2016-08-10 LAB — URINALYSIS, MICROSCOPIC (REFLEX): BACTERIA UA: NONE SEEN

## 2016-08-10 LAB — RESPIRATORY PANEL BY PCR
ADENOVIRUS-RVPPCR: NOT DETECTED
BORDETELLA PERTUSSIS-RVPCR: NOT DETECTED
CHLAMYDOPHILA PNEUMONIAE-RVPPCR: NOT DETECTED
CORONAVIRUS 229E-RVPPCR: NOT DETECTED
Coronavirus HKU1: NOT DETECTED
Coronavirus NL63: NOT DETECTED
Coronavirus OC43: NOT DETECTED
INFLUENZA B-RVPPCR: NOT DETECTED
Influenza A: NOT DETECTED
METAPNEUMOVIRUS-RVPPCR: NOT DETECTED
MYCOPLASMA PNEUMONIAE-RVPPCR: NOT DETECTED
PARAINFLUENZA VIRUS 1-RVPPCR: NOT DETECTED
PARAINFLUENZA VIRUS 2-RVPPCR: NOT DETECTED
PARAINFLUENZA VIRUS 3-RVPPCR: NOT DETECTED
Parainfluenza Virus 4: NOT DETECTED
RESPIRATORY SYNCYTIAL VIRUS-RVPPCR: DETECTED — AB
Rhinovirus / Enterovirus: NOT DETECTED

## 2016-08-10 LAB — CBC
HCT: 32.8 % — ABNORMAL LOW (ref 36.0–46.0)
HEMOGLOBIN: 10.8 g/dL — AB (ref 12.0–15.0)
MCH: 28.4 pg (ref 26.0–34.0)
MCHC: 32.9 g/dL (ref 30.0–36.0)
MCV: 86.3 fL (ref 78.0–100.0)
PLATELETS: 244 10*3/uL (ref 150–400)
RBC: 3.8 MIL/uL — ABNORMAL LOW (ref 3.87–5.11)
RDW: 12.7 % (ref 11.5–15.5)
WBC: 6.7 10*3/uL (ref 4.0–10.5)

## 2016-08-10 LAB — GLUCOSE, CAPILLARY
GLUCOSE-CAPILLARY: 182 mg/dL — AB (ref 65–99)
GLUCOSE-CAPILLARY: 103 mg/dL — AB (ref 65–99)
Glucose-Capillary: 431 mg/dL — ABNORMAL HIGH (ref 65–99)
Glucose-Capillary: 169 mg/dL — ABNORMAL HIGH (ref 65–99)

## 2016-08-10 LAB — URINALYSIS, ROUTINE W REFLEX MICROSCOPIC
BILIRUBIN URINE: NEGATIVE
Glucose, UA: >=500 mg/dL — AB
HGB URINE DIPSTICK: NEGATIVE
KETONES UR: NEGATIVE mg/dL
Leukocytes, UA: NEGATIVE
NITRITE: NEGATIVE
PH: 5.5 (ref 5.0–8.0)
Protein, ur: NEGATIVE mg/dL
SPECIFIC GRAVITY, URINE: 1.02 (ref 1.005–1.030)

## 2016-08-10 LAB — INFLUENZA PANEL BY PCR (TYPE A & B)
INFLAPCR: NEGATIVE
INFLBPCR: NEGATIVE

## 2016-08-10 LAB — OSMOLALITY, URINE: OSMOLALITY UR: 520 mosm/kg (ref 300–900)

## 2016-08-10 LAB — STREP PNEUMONIAE URINARY ANTIGEN: STREP PNEUMO URINARY ANTIGEN: NEGATIVE

## 2016-08-10 LAB — LACTIC ACID, PLASMA: Lactic Acid, Venous: 1.7 mmol/L (ref 0.5–1.9)

## 2016-08-10 LAB — SODIUM, URINE, RANDOM: Sodium, Ur: 122 mmol/L

## 2016-08-10 LAB — OSMOLALITY: OSMOLALITY: 302 mosm/kg — AB (ref 275–295)

## 2016-08-10 MED ORDER — INSULIN ASPART 100 UNIT/ML ~~LOC~~ SOLN
8.0000 [IU] | Freq: Once | SUBCUTANEOUS | Status: AC
  Administered 2016-08-10: 8 [IU] via SUBCUTANEOUS

## 2016-08-10 MED ORDER — PREDNISONE 20 MG PO TABS: 40.0000 mg | ORAL_TABLET | Freq: Every day | ORAL | 0 refills | Status: DC

## 2016-08-10 MED ORDER — MAGNESIUM SULFATE 2 GM/50ML IV SOLN
2.0000 g | Freq: Once | INTRAVENOUS | Status: AC
  Administered 2016-08-10: 2 g via INTRAVENOUS
  Filled 2016-08-10: qty 50

## 2016-08-10 NOTE — Discharge Summary (Addendum)
Physician Discharge Summary  Amanda [name] [id number] DOB: [birth year] DOA: 08/09/2016  PCP: Tamsen Roers, MD  Admit date: 08/09/2016 Discharge date: 08/10/2016  Admitted From: Home Disposition: Home   Recommendations for Outpatient Follow-up:  1. Follow up with PCP in 1-2 weeks 2. Please obtain BMP/CBC in one week 3. Monitor heart rate/rhythm. Had AFib w/RVR at admission, discharged with NSR.  Home Health: PT, RN Equipment/Devices: None Discharge Condition: Stable, improved CODE STATUS: Full Diet recommendation: Carbohydrate limited  Brief/Interim Summary: Amanda [name] is a 76 y.o. 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 She's had no fever, sputum production, chest pain, pleuritic pain, nausea or vomiting, abdominal pain, dysuria, urinary frequency. She does have a history of frequent urinary tract infections for which she was started 2 weeks ago on daily Bactrim, and has urology follow-up pending. She has a daughter who is also sick with an upper respiratory infection. She is adherent to Advair, but had not tried using her home rescue inhaler.  In the ED, temp 100.4 F, heart rate 157 in A. fib, respirations 24/m, pulse oximetry low 90s on room air, blood pressure 192/83 -Na 126, K 5.6, Cr 1.24 (baseline 1.0), WBC 7K, Hgb 12.7, total bilirubin 1.7 -Troponin negative -Lactic acid 3.13, trended down to normal. -Chest x-ray showed no focal opacity or edema -She was given 1.5 L normal saline, continuous albuterol nebulizer (after which she felt much better), empiric ceftriaxone and azithromycin, and Solu-Medrol 125 mg IV and TRH were asked to admit for COPD flare, possible pneumonia sepsis. Procalcitonin was negative, and RVP showed +RSV. Overnight the patient improved very rapidly and was discharged the following  day with 5 days of steroids, home nebulizers, and home health services per PT recommendations.   Discharge Diagnoses:  Principal Problem:   COPD exacerbation ([place]) Active Problems:   Type 2 diabetes mellitus with complication, with long-term current use of insulin (HCC)   Essential hypertension, benign   Stricture and stenosis of esophagus   Sepsis, unspecified organism ([place])   Atrial fibrillation with RVR ([place])   Community acquired pneumonia   Hyponatremia   Hyperkalemia   Hyperbilirubinemia   RSV bronchiolitis  COPD exacerbation due to RSV bronchiolitis: Chronic COPD with new dyspnea, cough and wheezing with normal CXR.  -Prednisone 40 mg daily for 5 days -Xopenex scheduled with ipratropium -Albuterol when necessary -Continue Advair - Initially on abx, stopped due to PCT < 0.10 and +RSV w/neg CXR.  Atrial fibrillation with RVR initially: CHADS2-VASc 5, patient has refused anticoagulation in the past. Also is not on rate control agent at present.  - Reverted spontaneously to NSR.  Recurrent UTI:  -Continue outpatient bactrim -Urine culture nonclonal.  IDDM: -Continued home 70/30 -SSI with meals  Discharge Instructions Discharge Instructions    Discharge instructions    Complete by:  As directed    You were admitted with a COPD flare/exacerbation. You tested positive for RSV (respiratory syncytial virus), a common virus going around, which likely caused the flare. Because you don't require oxygen and the xray does not show a pneumonia, and you are eating/drinking and do not require IV therapies, you may be discharged today with the following recommendations:  - Continue taking prednisone (steroid) once daily in the mornings for the next 4 mornings. This has been sent to your pharmacy. Start tomorrow.  - Use your  nebulizer at home as needed for wheezing or shortness of breath.  - If you are still short of breath despite using the nebulizer, seek medical attention.  -  Continue taking other medications as your were, including bactrim.  - Follow up with with the urologist and your PCP in the next week or two. - If you become unable to take anything by mouth or your symptoms worsen you need to be evaluated promptly by a medical provider.  - You will receive home health physical therapy and nursing assessment.   Increase activity slowly    Complete by:  As directed      Allergies as of 08/10/2016   No Known Allergies     Medication List    TAKE these medications   aspirin 81 MG EC tablet Take 1 tablet (81 mg total) by mouth daily.   ferrous sulfate 325 (65 FE) MG tablet Take 1 tablet (325 mg total) by mouth daily.   Fluticasone-Salmeterol 250-50 MCG/DOSE Aepb Commonly known as:  ADVAIR DISKUS Inhale 1 puff into the lungs 2 (two) times daily.   insulin NPH-regular Human (70-30) 100 UNIT/ML injection Commonly known as:  NOVOLIN 70/30 Inject 6 Units into the skin daily with breakfast.   levalbuterol 45 MCG/ACT inhaler Commonly known as:  XOPENEX HFA Inhale 2 puffs into the lungs every 6 (six) hours as needed for wheezing or shortness of breath.   NONFORMULARY OR COMPOUNDED ITEM Take 2 mg by mouth at bedtime. Budesonide viscous 1mg /24ml oral suspension.  Take 16 ml (2mg ) by mouth every night at bedtime. Take nothing by mouth for 1 hour after dose.  [facility] Compounding [phone].   omeprazole 40 MG capsule Commonly known as:  PRILOSEC Take 1 capsule (40 mg total) by mouth 2 (two) times daily before a meal. 30 mins before Breakfast and supper   oxybutynin 5 MG tablet Commonly known as:  DITROPAN Take 5 mg by mouth 2 (two) times daily.   phenazopyridine 95 MG tablet Commonly known as:  PYRIDIUM Take 95 mg by mouth 3 (three) times daily as needed for pain. OTC   predniSONE 20 MG tablet Commonly known as:  DELTASONE Take 2 tablets (40 mg total) by mouth daily with breakfast. Start taking on:  08/11/2016   sulfamethoxazole-trimethoprim  800-160 MG tablet Commonly known as:  BACTRIM DS,SEPTRA DS Take 1 tablet by mouth at bedtime.   Vitamin D (Ergocalciferol) 50000 units Caps capsule Commonly known as:  DRISDOL Take 50,000 Units by mouth every 7 (seven) days. ON FRIDAYS      Follow-up Information    LITTLE,JAMES, MD. Schedule an appointment as soon as possible for a visit in 1 week(s).   Specialty:  Family Medicine Contact information: [place] [address] [phone]          No Known Allergies  Consultations:  None  Procedures/Studies: X-ray Chest Pa And Lateral  Result Date: 08/10/2016 CLINICAL DATA:  Weakness, cough for 2 days EXAM: CHEST  2 VIEW COMPARISON:  08/09/2016 FINDINGS: Cardiomediastinal silhouette is stable. Stable streaky scarring and hilar retraction in right upper lobe. No definite superimposed infiltrate or pulmonary edema. High riding right humeral prosthesis without change in position. Osteopenia and mild degenerative change thoracic spine. IMPRESSION: Stable scarring in right upper lobe. No definite superimposed infiltrate or pulmonary edema. Electronically Signed   By: Lahoma Crocker M.D.   On: 08/10/2016 10:23   Dg Chest Port 1 View  Result Date: 08/09/2016 CLINICAL DATA:  SOB, congestion and cough x 3 days; hx COPD; EXAM: PORTABLE CHEST 1 VIEW COMPARISON:  10/25/2014 FINDINGS: Cardiac silhouette is normal in size. No mediastinal or hilar masses. Right upper lobe scarring with retraction of the right hilum superiorly is stable. There is milder left upper lobe scar ring. Lungs are hyperexpanded, but otherwise clear. No pleural effusion.  No pneumothorax. Right shoulder prosthesis is stable. Skeletal structures are diffusely demineralized. IMPRESSION: No acute cardiopulmonary disease. Electronically Signed   By: Lajean Manes M.D.   On: 08/09/2016 17:36    Subjective: Pt with significant improvement in breathing overnight, wants to go home. On room air, ambulating, eating.  Afebrile.  Discharge Exam: Vitals:   08/10/16 0557 08/10/16 1300  BP: (!) 150/63 (!) 148/68  Pulse: 84 78  Resp: 20 20  Temp: 98.3 F (36.8 C) 98.3 F (36.8 C)   Vitals:   08/09/16 2317 08/10/16 0557 08/10/16 1023 08/10/16 1300  BP:  (!) 150/63  (!) 148/68  Pulse:  84  78  Resp:  20  20  Temp:  98.3 F (36.8 C)  98.3 F (36.8 C)  TempSrc:  Oral  Oral  SpO2: 93% 97% 93% 96%  Weight:      Height:       General: Pt is alert, awake, not in acute distress Cardiovascular: RRR, S1/S2 +, no rubs, no gallops Respiratory: Nonlabored. +end-expiratory wheezing and rhonchi scattered. Abdominal: Soft, NT, ND, bowel sounds + Extremities: no edema, no cyanosis  The results of significant diagnostics from this hospitalization (including imaging, microbiology, ancillary and laboratory) are listed below for reference.    Microbiology: Recent Results (from the past 240 hour(s))  Respiratory Panel by PCR     Status: Abnormal   Collection Time: 08/09/16 11:44 PM  Result Value Ref Range Status   Adenovirus NOT DETECTED NOT DETECTED Final   Coronavirus 229E NOT DETECTED NOT DETECTED Final   Coronavirus HKU1 NOT DETECTED NOT DETECTED Final   Coronavirus NL63 NOT DETECTED NOT DETECTED Final   Coronavirus OC43 NOT DETECTED NOT DETECTED Final   Metapneumovirus NOT DETECTED NOT DETECTED Final   Rhinovirus / Enterovirus NOT DETECTED NOT DETECTED Final   Influenza A NOT DETECTED NOT DETECTED Final   Influenza B NOT DETECTED NOT DETECTED Final   Parainfluenza Virus 1 NOT DETECTED NOT DETECTED Final   Parainfluenza Virus 2 NOT DETECTED NOT DETECTED Final   Parainfluenza Virus 3 NOT DETECTED NOT DETECTED Final   Parainfluenza Virus 4 NOT DETECTED NOT DETECTED Final   Respiratory Syncytial Virus DETECTED (A) NOT DETECTED Final    Comment: CRITICAL RESULT CALLED TO, READ BACK BY AND VERIFIED WITH: Alycia Rossetti RN 13:00 08/10/16 ([email or web address])    Bordetella pertussis NOT DETECTED NOT DETECTED Final    Chlamydophila pneumoniae NOT DETECTED NOT DETECTED Final   Mycoplasma pneumoniae NOT DETECTED NOT DETECTED Final    Comment: Performed at [facility]: BNP (last 3 results) No results for input(s): BNP in the last 8760 hours. Basic Metabolic Panel:  Recent Labs Lab 08/09/16 1744 08/09/16 2241 08/10/16 0803  NA 126*  --  134*  K 5.6*  --  4.6  CL 93*  --  103  CO2 24  --  24  GLUCOSE 133*  --  237*  BUN 16  --  21*  CREATININE 1.24*  --  1.19*  CALCIUM 9.0  --  8.9  MG  --  1.4*  --    Liver Function Tests:  Recent Labs Lab 08/09/16 1744 08/09/16 2241 08/10/16 0803  AST 40  --  25  ALT 13*  --  14  ALKPHOS 71  --  58  BILITOT 1.7* 0.4 0.6  PROT 7.7  --  6.4*  ALBUMIN 4.3  --  3.5   No results for input(s): LIPASE, AMYLASE in the last 168 hours. No results for input(s): AMMONIA in the last 168 hours. CBC:  Recent Labs Lab 08/09/16 1744 08/10/16 0803  WBC 7.0 6.7  NEUTROABS 5.2  --   HGB 12.7 10.8*  HCT 38.4 32.8*  MCV 87.5 86.3  PLT 270 244   Cardiac Enzymes: No results for input(s): CKTOTAL, CKMB, CKMBINDEX, TROPONINI in the last 168 hours. BNP: Invalid input(s): POCBNP CBG:  Recent Labs Lab 08/09/16 2319 08/10/16 0105 08/10/16 0547 08/10/16 0736 08/10/16 1221  GLUCAP 452* 431* 169* 182* 103*   D-Dimer No results for input(s): DDIMER in the last 72 hours. Hgb A1c No results for input(s): HGBA1C in the last 72 hours. Lipid Profile No results for input(s): CHOL, HDL, LDLCALC, TRIG, CHOLHDL, LDLDIRECT in the last 72 hours. Thyroid function studies  Recent Labs  08/09/16 2241  TSH 0.550   Anemia work up No results for input(s): VITAMINB12, FOLATE, FERRITIN, TIBC, IRON, RETICCTPCT in the last 72 hours. Urinalysis    Component Value Date/Time   COLORURINE YELLOW 08/09/2016 [place] 08/09/2016 2344   LABSPEC 1.020 08/09/2016 2344   PHURINE 5.5 08/09/2016 2344   GLUCOSEU >=500 (A) 08/09/2016 2344    HGBUR NEGATIVE 08/09/2016 2344   BILIRUBINUR NEGATIVE 08/09/2016 2344   KETONESUR NEGATIVE 08/09/2016 2344   PROTEINUR NEGATIVE 08/09/2016 2344   UROBILINOGEN 0.2 03/28/2014 2229   NITRITE NEGATIVE 08/09/2016 2344   LEUKOCYTESUR NEGATIVE 08/09/2016 2344   Sepsis Labs Invalid input(s): PROCALCITONIN,  WBC,  LACTICIDVEN Microbiology Recent Results (from the past 240 hour(s))  Respiratory Panel by PCR     Status: Abnormal   Collection Time: 08/09/16 11:44 PM  Result Value Ref Range Status   Adenovirus NOT DETECTED NOT DETECTED Final   Coronavirus 229E NOT DETECTED NOT DETECTED Final   Coronavirus HKU1 NOT DETECTED NOT DETECTED Final   Coronavirus NL63 NOT DETECTED NOT DETECTED Final   Coronavirus OC43 NOT DETECTED NOT DETECTED Final   Metapneumovirus NOT DETECTED NOT DETECTED Final   Rhinovirus / Enterovirus NOT DETECTED NOT DETECTED Final   Influenza A NOT DETECTED NOT DETECTED Final   Influenza B NOT DETECTED NOT DETECTED Final   Parainfluenza Virus 1 NOT DETECTED NOT DETECTED Final   Parainfluenza Virus 2 NOT DETECTED NOT DETECTED Final   Parainfluenza Virus 3 NOT DETECTED NOT DETECTED Final   Parainfluenza Virus 4 NOT DETECTED NOT DETECTED Final   Respiratory Syncytial Virus DETECTED (A) NOT DETECTED Final    Comment: CRITICAL RESULT CALLED TO, READ BACK BY AND VERIFIED WITH: Alycia Rossetti RN 13:00 08/10/16 ([email or web address])    Bordetella pertussis NOT DETECTED NOT DETECTED Final   Chlamydophila pneumoniae NOT DETECTED NOT DETECTED Final   Mycoplasma pneumoniae NOT DETECTED NOT DETECTED Final    Comment: Performed at [facility]   Time coordinating discharge: Over 19 minutes  Vance Gather, MD  [facility] 08/10/2016, 4:13 PM Pager [phone]

## 2016-08-10 NOTE — Progress Notes (Signed)
Pt was out of network with [facility].  Explained this to pt. will check in am for another [facility] agency. Pt was OK with that.

## 2016-08-10 NOTE — Progress Notes (Signed)
Taking over care of patient agree with previous RN assessment. Denies any needs at this time. Will continue to monitor.  

## 2016-08-11 LAB — URINE CULTURE: Culture: <10000 — AB

## 2016-08-11 LAB — LEGIONELLA PNEUMOPHILA SEROGP 1 UR AG: L. PNEUMOPHILA SEROGP 1 UR AG: NEGATIVE

## 2016-08-11 NOTE — Progress Notes (Addendum)
Spoke with Amanda [name] on his cell phone to inform him and pt that [facility] would call to setup appointment. Pt did not want [facility].

## 2016-08-14 ENCOUNTER — Telehealth: Payer: Self-pay | Admitting: Pulmonary Disease

## 2016-08-14 MED ORDER — PREDNISONE 10 MG PO TABS: ORAL_TABLET | ORAL | 0 refills | Status: DC

## 2016-08-14 NOTE — Telephone Encounter (Signed)
Spoke with Denyse Amass with [facility]. States that pt is having a possible flare up. Reports increased SOB, wheezing. Denies chest tightness, coughing. Pt is finishing up a prednisone prescription today. Denyse Amass is afraid for her to go through the weekend without having some kind of medication on board. Pt is currently on room air and her sats are dropping into the low 90s.  BQ - please advise. Thanks.

## 2016-08-14 NOTE — Telephone Encounter (Signed)
Spoke with Denyse Amass at [facility], aware of recs.  States he is not currently with pt and has asked me to call her to relay recs.   Spoke with pt, aware of recs.  rx sent to preferred pharmacy.  Nothing further needed.

## 2016-08-14 NOTE — Telephone Encounter (Signed)
Will send to DOD to give rec's as we have not heard back from Dr [place] Bells. Dr Melvyn Novas please advise. Thanks.

## 2016-08-14 NOTE — Telephone Encounter (Signed)
Sob after walking about 20 steps and wheezing

## 2016-08-14 NOTE — Telephone Encounter (Signed)
If prednisone helped, repeat it, if not will need to go to UC or ER at this late hour nothing else to offer  Prednisone 10 mg take  4 each am x 2 days,   2 each am x 2 days,  1 each am x 2 days and stop

## 2016-08-15 LAB — CULTURE, BLOOD (ROUTINE X 2)
CULTURE: NO GROWTH
Culture: NO GROWTH

## 2016-08-16 NOTE — Telephone Encounter (Signed)
Thanks Mike!

## 2016-08-21 ENCOUNTER — Inpatient Hospital Stay: Payer: Medicare HMO | Admitting: Adult Health

## 2016-08-21 NOTE — Progress Notes (Signed)
  Progress Note   Date: 08/21/2016  Patient Name: Amanda [name]        MRN#: [id number]   Clarification of the diagnosis of sepsis:   sepsis was present, was due to RSV bronchitis/bronchiolitis, and resolved.    Vance Gather, MD

## 2016-09-07 ENCOUNTER — Other Ambulatory Visit: Payer: Self-pay | Admitting: Internal Medicine

## 2016-09-18 ENCOUNTER — Ambulatory Visit (INDEPENDENT_AMBULATORY_CARE_PROVIDER_SITE_OTHER): Payer: Medicare HMO | Admitting: Pulmonary Disease

## 2016-09-18 ENCOUNTER — Telehealth: Payer: Self-pay | Admitting: Pulmonary Disease

## 2016-09-18 ENCOUNTER — Encounter: Payer: Self-pay | Admitting: Pulmonary Disease

## 2016-09-18 DIAGNOSIS — R9389 Abnormal findings on diagnostic imaging of other specified body structures: Secondary | ICD-10-CM

## 2016-09-18 DIAGNOSIS — R938 Abnormal findings on diagnostic imaging of other specified body structures: Secondary | ICD-10-CM | POA: Diagnosis not present

## 2016-09-18 MED ORDER — FLUTICASONE-SALMETEROL 232-14 MCG/ACT IN AEPB: 1.0000 | INHALATION_SPRAY | Freq: Two times a day (BID) | RESPIRATORY_TRACT | 11 refills | Status: DC

## 2016-09-18 MED ORDER — FLUTICASONE-SALMETEROL 250-50 MCG/DOSE IN AEPB: 1.0000 | INHALATION_SPRAY | Freq: Two times a day (BID) | RESPIRATORY_TRACT | 5 refills | Status: DC

## 2016-09-18 MED ORDER — TIOTROPIUM BROMIDE MONOHYDRATE 18 MCG IN CAPS: 18.0000 ug | ORAL_CAPSULE | Freq: Every day | RESPIRATORY_TRACT | 3 refills | Status: DC

## 2016-09-18 NOTE — Progress Notes (Signed)
Subjective:    Patient ID: Amanda [name], female    DOB: September 11, 1940, 76 y.o.   MRN: [id number]  Synopsis> likely COPD, first seen by [facility] 2015. Also diagnosed with stage I colon cancer requiring a partial colectomy in 2015. July 2015 pulmonary function testing, clear airflow obstruction FEV1 37% predicted  HPI Chief Complaint  Patient presents with  . Follow-up    pt doing well, no complaints today.    Since Her recent hospitalization she has been feeling better. Unfortunately she required hospitalization for COPD exacerbation several weeks ago. She was discharged on prednisone and antibiotics. She's now feeling better. She is currently taking Spiriva daily in addition to albuterol as needed. She also takes Advair twice a day. She notes some shortness of breath with exertion. Minimal wheeze or chest tightness right now.   Past Medical History:  Diagnosis Date  . Arrhythmia    7-23-15x1beginning of colon surgery 01-12-14 - no problems since.  . Cancer ([place])    colon cancer 01-12-14- no further tx. intended  . Candida esophagitis ([place]) 01/09/2014  . Closed fracture of unspecified part of upper end of humerus   . Complete rupture of rotator cuff    right shoulder- limited range of motion  . COPD (chronic obstructive pulmonary disease) ([place])   . Emphysema lung ([place])   . Esophageal stricture 12/28/2013  . Essential hypertension, benign    off lisinporil for last 2 months  . GERD (gastroesophageal reflux disease)   . HCAP (healthcare-associated pneumonia) 03/28/2014  . Hx of adenomatous colonic polyps 05/15/2015  . Hyperlipidemia   . Neurogenic bladder, NOS    02-22-14 some bladder issues of urgency is somewhat improve.  . Other vitamin B12 deficiency anemia   . Type II or unspecified type diabetes mellitus without mention of complication, uncontrolled    diet controlled  . Vitamin D deficiency      Review of Systems  Constitutional: Negative for chills, fatigue and  fever.  HENT: Negative for postnasal drip, rhinorrhea and sinus pressure.   Respiratory: Negative for cough, shortness of breath and wheezing.   Cardiovascular: Negative for chest pain, palpitations and leg swelling.       Objective:   Physical Exam Vitals:   09/18/16 1038  BP: 140/68  BP Location: Left Arm  Cuff Size: Normal  Pulse: 90  SpO2: 95%  Weight: 115 lb (52.2 kg)  Height: 5\' 1"  (1.549 m)  RA   Gen: well appearing HENT: OP clear, TM's clear, neck supple PULM: RUL wheezing, otherwise clear, normal percussion CV: RRR, no mgr, trace edema GI: BS+, soft, nontender Derm: no cyanosis or rash Psyche: normal mood and affect   Records from her 08/2016 discharge summary reviewed where she required admission for COPD exacerbation, she was treated with prednisone and had a clear CXR.    CXR from January independently reviewed showing a persistent scarring in the RUL     Assessment & Plan:   COPD (chronic obstructive pulmonary disease) ([place]) Unfortunately Amanda [name] had an exacerbation of her severe COPD requiring hospitalization in January. She seems to have recovered well, but because of the exacerbation severity of think it's wise to continue Spiriva as prescribed by her primary care physician. She would like to switch to the generic form of Advair which is fine.  Plan: Continue Spiriva Change Advair to Airduo (generic) Remain active F/u three months  Abnormal CXR She has had a persistent abnormality of her chest x-ray in the right upper lobe since  2015, fortunately this has not changed. We will plan on repeating a chest x-ray in about 6 months to follow this up.   Updated Medication List Outpatient Encounter Prescriptions as of 09/18/2016  Medication Sig  . aspirin EC 81 MG EC tablet Take 1 tablet (81 mg total) by mouth daily.  . ferrous sulfate 325 (65 FE) MG tablet Take 1 tablet (325 mg total) by mouth daily.  . Fluticasone-Salmeterol (ADVAIR DISKUS) 250-50 MCG/DOSE  AEPB Inhale 1 puff into the lungs 2 (two) times daily.  . insulin NPH-regular Human (NOVOLIN 70/30) (70-30) 100 UNIT/ML injection Inject 6 Units into the skin daily with breakfast.  . levalbuterol (XOPENEX HFA) 45 MCG/ACT inhaler Inhale 2 puffs into the lungs every 6 (six) hours as needed for wheezing or shortness of breath.  . NONFORMULARY OR COMPOUNDED ITEM Take 2 mg by mouth at bedtime. Budesonide viscous 1mg /50ml oral suspension.  Take 16 ml (2mg ) by mouth every night at bedtime. Take nothing by mouth for 1 hour after dose.  [facility]ounding [phone].  Marland Kitchen omeprazole (PRILOSEC) 40 MG capsule TAKE ONE CAPSULE BY MOUTH TWICE DAILY BEFORE MEAL(S) 30 MINUTES BEFORE BREAKFAST AND SUPPER  . oxybutynin (DITROPAN) 5 MG tablet Take 5 mg by mouth 2 (two) times daily.  . phenazopyridine (PYRIDIUM) 95 MG tablet Take 95 mg by mouth 3 (three) times daily as needed for pain. OTC  . sulfamethoxazole-trimethoprim (BACTRIM DS,SEPTRA DS) 800-160 MG tablet Take 1 tablet by mouth at bedtime.  Marland Kitchen tiotropium (SPIRIVA) 18 MCG inhalation capsule Place 18 mcg into inhaler and inhale daily.  . Vitamin D, Ergocalciferol, (DRISDOL) 50000 units CAPS capsule Take 50,000 Units by mouth every 7 (seven) days. ON FRIDAYS  . [DISCONTINUED] predniSONE (DELTASONE) 10 MG tablet 40mg X2 days, 20mg  X2 days, 10mg X2 days, then stop. (Patient not taking: Reported on 09/18/2016)  . [DISCONTINUED] predniSONE (DELTASONE) 20 MG tablet Take 2 tablets (40 mg total) by mouth daily with breakfast. (Patient not taking: Reported on 09/18/2016)   No facility-administered encounter medications on file as of 09/18/2016.

## 2016-09-18 NOTE — Assessment & Plan Note (Signed)
She has had a persistent abnormality of her chest x-ray in the right upper lobe since 2015, fortunately this has not changed. We will plan on repeating a chest x-ray in about 6 months to follow this up.

## 2016-09-18 NOTE — Telephone Encounter (Signed)
Spoke with pt. She is aware of BQ's instructions. States that she needs a new prescription for Advair. Rx has been sent in. Nothing further was needed.

## 2016-09-18 NOTE — Assessment & Plan Note (Signed)
Unfortunately Amanda [name] had an exacerbation of her severe COPD requiring hospitalization in January. She seems to have recovered well, but because of the exacerbation severity of think it's wise to continue Spiriva as prescribed by her primary care physician. She would like to switch to the generic form of Advair which is fine.  Plan: Continue Spiriva Change Advair to Airduo (generic) Remain active F/u three months

## 2016-09-18 NOTE — Telephone Encounter (Signed)
Patient is returning phone call.  °

## 2016-09-18 NOTE — Telephone Encounter (Signed)
Instructions     Return in about 3 months (around 12/16/2016).  If the generic form of Advair is too expensive, just go back to taking Advair twice a day Continue taking Spiriva daily Remain active We will see you back in 3 months or sooner if needed   Note from today clearly states to just go back to the regular advair if the generic is too expensive  LMTCB

## 2016-09-18 NOTE — Patient Instructions (Signed)
If the generic form of Advair is too expensive, just go back to taking Advair twice a day Continue taking Spiriva daily Remain active We will see you back in 3 months or sooner if needed

## 2016-12-25 ENCOUNTER — Encounter: Payer: Self-pay | Admitting: Pulmonary Disease

## 2016-12-25 ENCOUNTER — Ambulatory Visit (INDEPENDENT_AMBULATORY_CARE_PROVIDER_SITE_OTHER): Payer: Medicare HMO | Admitting: Pulmonary Disease

## 2016-12-25 DIAGNOSIS — J432 Centrilobular emphysema: Secondary | ICD-10-CM | POA: Diagnosis not present

## 2016-12-25 NOTE — Progress Notes (Signed)
Subjective:    Patient ID: Amanda [name], female    DOB: 05-07-1941, 76 y.o.   MRN: [id number]  Synopsis> likely COPD, first seen by [facility] 2015. Also diagnosed with stage I colon cancer requiring a partial colectomy in 2015. July 2015 pulmonary function testing, clear airflow obstruction FEV1 37% predicted  HPI Chief Complaint  Patient presents with  . Follow-up    pt doing well, no complaints at this time.    No bronchitis since the last visit, no pneumonia. Her Spiriva is still working.  She has not started on the new generic because it is too expensive.   She is still taking Advair. She remains active, she is walking her dog regularly.  She has an exercise bike which she rides 2-3 times per week for 20 minute. She would like to stop the Spiriva due to cost.   No cough, chest congestion.    Past Medical History:  Diagnosis Date  . Arrhythmia    7-23-15x1beginning of colon surgery 01-12-14 - no problems since.  . Cancer ([place])    colon cancer 01-12-14- no further tx. intended  . Candida esophagitis ([place]) 01/09/2014  . Closed fracture of unspecified part of upper end of humerus   . Complete rupture of rotator cuff    right shoulder- limited range of motion  . COPD (chronic obstructive pulmonary disease) ([place])   . Emphysema lung ([place])   . Esophageal stricture 12/28/2013  . Essential hypertension, benign    off lisinporil for last 2 months  . GERD (gastroesophageal reflux disease)   . HCAP (healthcare-associated pneumonia) 03/28/2014  . Hx of adenomatous colonic polyps 05/15/2015  . Hyperlipidemia   . Neurogenic bladder, NOS    02-22-14 some bladder issues of urgency is somewhat improve.  . Other vitamin B12 deficiency anemia   . Type II or unspecified type diabetes mellitus without mention of complication, uncontrolled    diet controlled  . Vitamin D deficiency      Review of Systems  Constitutional: Negative for chills, fatigue and fever.  HENT: Negative for  postnasal drip, rhinorrhea and sinus pressure.   Respiratory: Negative for cough, shortness of breath and wheezing.   Cardiovascular: Negative for chest pain, palpitations and leg swelling.       Objective:   Physical Exam Vitals:   12/25/16 1341  BP: 134/74  Pulse: 74  SpO2: 96%  Weight: 116 lb (52.6 kg)  Height: 5\' 1"  (1.549 m)  RA   Gen: well appearing HENT: OP clear, TM's clear, neck supple PULM: CTA B, normal percussion CV: RRR, no mgr, trace edema GI: BS+, soft, nontender Derm: no cyanosis or rash Psyche: normal mood and affect         Assessment & Plan:   COPD (chronic obstructive pulmonary disease) (HCC) This has been a stable interval for Mrs. Oland. She has not had an exacerbation since the last visit. Cost of medications is a serious problem for her. She would like to stop Spiriva. Since she has not had an exacerbation of think it's reasonable to go ahead and stop.  Plan: Stop Spiriva, however she was instructed to restart taking the medicine again if she develops increasing chest tightness shortness of breath or wheezing Continue Advair, I am open to changing this medicine if a cheaper alternative is available through her insurance company Immunizations are up-to-date Flu shot in the fall Follow-up 6 months   Updated Medication List Outpatient Encounter Prescriptions as of 12/25/2016  Medication Sig  .  aspirin EC 81 MG EC tablet Take 1 tablet (81 mg total) by mouth daily.  . ferrous sulfate 325 (65 FE) MG tablet Take 1 tablet (325 mg total) by mouth daily.  . Fluticasone-Salmeterol (ADVAIR DISKUS) 250-50 MCG/DOSE AEPB Inhale 1 puff into the lungs 2 (two) times daily.  . insulin NPH-regular Human (NOVOLIN 70/30) (70-30) 100 UNIT/ML injection Inject 6 Units into the skin daily with breakfast.  . levalbuterol (XOPENEX HFA) 45 MCG/ACT inhaler Inhale 2 puffs into the lungs every 6 (six) hours as needed for wheezing or shortness of breath.  . NONFORMULARY OR  COMPOUNDED ITEM Take 2 mg by mouth at bedtime. Budesonide viscous 1mg /91ml oral suspension.  Take 16 ml (2mg ) by mouth every night at bedtime. Take nothing by mouth for 1 hour after dose.  [facility] Compounding [phone].  Marland Kitchen omeprazole (PRILOSEC) 40 MG capsule TAKE ONE CAPSULE BY MOUTH TWICE DAILY BEFORE MEAL(S) 30 MINUTES BEFORE BREAKFAST AND SUPPER  . oxybutynin (DITROPAN) 5 MG tablet Take 5 mg by mouth 2 (two) times daily.  Marland Kitchen tiotropium (SPIRIVA) 18 MCG inhalation capsule Place 1 capsule (18 mcg total) into inhaler and inhale daily.  . Vitamin D, Ergocalciferol, (DRISDOL) 50000 units CAPS capsule Take 50,000 Units by mouth every 7 (seven) days. ON FRIDAYS  . [DISCONTINUED] phenazopyridine (PYRIDIUM) 95 MG tablet Take 95 mg by mouth 3 (three) times daily as needed for pain. OTC  . [DISCONTINUED] sulfamethoxazole-trimethoprim (BACTRIM DS,SEPTRA DS) 800-160 MG tablet Take 1 tablet by mouth at bedtime.   No facility-administered encounter medications on file as of 12/25/2016.

## 2016-12-25 NOTE — Patient Instructions (Signed)
Call us if your insurance company offers a cheaper alternative to [place] taking Spiriva If you notice increasing chest tightness shortness of breath or wheezing after stopping Spiriva start taking it again We will see you back in 6 months or sooner if needed

## 2016-12-25 NOTE — Assessment & Plan Note (Signed)
This has been a stable interval for Amanda [name]. She has not had an exacerbation since the last visit. Cost of medications is a serious problem for her. She would like to stop Spiriva. Since she has not had an exacerbation of think it's reasonable to go ahead and stop.  Plan: Stop Spiriva, however she was instructed to restart taking the medicine again if she develops increasing chest tightness shortness of breath or wheezing Continue Advair, I am open to changing this medicine if a cheaper alternative is available through her insurance company Immunizations are up-to-date Flu shot in the fall Follow-up 6 months

## 2017-05-06 ENCOUNTER — Other Ambulatory Visit: Payer: Self-pay | Admitting: Pulmonary Disease

## 2017-07-02 ENCOUNTER — Ambulatory Visit: Payer: Medicare HMO | Admitting: Pulmonary Disease

## 2017-08-27 ENCOUNTER — Encounter: Payer: Self-pay | Admitting: Pulmonary Disease

## 2017-08-27 ENCOUNTER — Ambulatory Visit: Payer: Medicare HMO | Admitting: Pulmonary Disease

## 2017-08-27 ENCOUNTER — Ambulatory Visit (INDEPENDENT_AMBULATORY_CARE_PROVIDER_SITE_OTHER)
Admission: RE | Admit: 2017-08-27 | Discharge: 2017-08-27 | Disposition: A | Discharge status: Home / Self Care | Payer: Medicare HMO | Source: Ambulatory Visit | Attending: Pulmonary Disease | Admitting: Pulmonary Disease

## 2017-08-27 VITALS — BP 132/76 | HR 81 | Ht 61.0 in | Wt 118.0 lb

## 2017-08-27 DIAGNOSIS — J189 Pneumonia, unspecified organism: Secondary | ICD-10-CM | POA: Diagnosis not present

## 2017-08-27 DIAGNOSIS — J432 Centrilobular emphysema: Secondary | ICD-10-CM | POA: Diagnosis not present

## 2017-08-27 MED ORDER — ALBUTEROL SULFATE HFA 108 (90 BASE) MCG/ACT IN AERS
2.0000 | INHALATION_SPRAY | Freq: Four times a day (QID) | RESPIRATORY_TRACT | 11 refills | Status: DC | PRN
Start: 2017-08-27 — End: 2018-02-14

## 2017-08-27 NOTE — Patient Instructions (Signed)
COPD: Continue Advair twice a day, the generic form of this is called Airduo, and we are happy to change your prescription if your insurance covers this Continue to use albuterol as needed for chest tightness wheezing or shortness of breath stay active Practice good hand hygiene this time a year  Recent community-acquired pneumonia: I am glad it sounds like you have recovered from this You have had both forms of the pneumonia vaccine in the last 5 years so there is no need for a repeat vaccination We will get a chest x-ray today to make sure everything has cleared up  Follow-up with me in 4-6 months or sooner if need

## 2017-08-27 NOTE — Progress Notes (Signed)
Subjective:    Patient ID: Amanda [name], female    DOB: 11-26-1940, 77 y.o.   MRN: [id number]  Synopsis> likely COPD, first seen by [facility] 2015. Also diagnosed with stage I colon cancer requiring a partial colectomy in 2015. July 2015 pulmonary function testing, clear airflow obstruction FEV1 37% predicted  HPI Chief Complaint  Patient presents with  . Follow-up    pt dx'ed with PNA X1 month ago by pcp, states she is not 100% improved from this.     Wellness so she was diagnosed with pneumonia in [name].  Since then she says that she is been slowly feeling better.  She remembers having a cough with some green mucus production at the time.  She was treated with antibiotics by her primary care physician.  She is trying to exercise some.  She says that she has struggled with thrush recently which has been associated with candidal esophagitis.  She takes fluconazole which she says has helped.  She would like to make sure that she has both forms of the pneumonia vaccine.  Past Medical History:  Diagnosis Date  . Arrhythmia    7-23-15x1beginning of colon surgery 01-12-14 - no problems since.  . Cancer ([place])    colon cancer 01-12-14- no further tx. intended  . Candida esophagitis ([place]) 01/09/2014  . Closed fracture of unspecified part of upper end of humerus   . Complete rupture of rotator cuff    right shoulder- limited range of motion  . COPD (chronic obstructive pulmonary disease) ([place])   . Emphysema lung ([place])   . Esophageal stricture 12/28/2013  . Essential hypertension, benign    off lisinporil for last 2 months  . GERD (gastroesophageal reflux disease)   . HCAP (healthcare-associated pneumonia) 03/28/2014  . Hx of adenomatous colonic polyps 05/15/2015  . Hyperlipidemia   . Neurogenic bladder, NOS    02-22-14 some bladder issues of urgency is somewhat improve.  . Other vitamin B12 deficiency anemia   . Type II or unspecified type diabetes mellitus without mention of  complication, uncontrolled    diet controlled  . Vitamin D deficiency      Review of Systems  Constitutional: Negative for chills, fatigue and fever.  HENT: Negative for postnasal drip, rhinorrhea and sinus pressure.   Respiratory: Negative for cough, shortness of breath and wheezing.   Cardiovascular: Negative for chest pain, palpitations and leg swelling.       Objective:   Physical Exam Vitals:   08/27/17 1204  BP: 132/76  Pulse: 81  SpO2: 96%  Weight: 118 lb (53.5 kg)  Height: 5\' 1"  (1.549 m)  RA   Gen: elderly but well appearing HENT: OP clear, TM's clear, neck supple PULM: Few crackles bases B, normal percussion CV: RRR, no mgr, trace edema GI: BS+, soft, nontender Derm: no cyanosis or rash Psyche: normal mood and affect  CBC    Component Value Date/Time   WBC 6.7 08/10/2016 0803   RBC 3.80 (L) 08/10/2016 0803   HGB 10.8 (L) 08/10/2016 0803   HGB 13.4 02/15/2015 1133   HCT 32.8 (L) 08/10/2016 0803   HCT 40.8 02/15/2015 1133   PLT 244 08/10/2016 0803   PLT 333 02/15/2015 1133   MCV 86.3 08/10/2016 0803   MCV 89.7 02/15/2015 1133   MCH 28.4 08/10/2016 0803   MCHC 32.9 08/10/2016 0803   RDW 12.7 08/10/2016 0803   RDW 13.2 02/15/2015 1133   LYMPHSABS 0.9 08/09/2016 1744   LYMPHSABS 1.6 02/15/2015 1133  MONOABS 0.7 08/09/2016 1744   MONOABS 0.6 02/15/2015 1133   EOSABS 0.2 08/09/2016 1744   EOSABS 0.3 02/15/2015 1133   BASOSABS 0.1 08/09/2016 1744   BASOSABS 0.1 02/15/2015 1133        Assessment & Plan:   Community acquired pneumonia, unspecified laterality - Plan: DG Chest 2 View  Centrilobular emphysema ([place])  Discussion: Amanda [name] says she had an episode of pneumonia in the fall but she seems to have recovered since then.  Otherwise this is been a stable interval for her.  She would like to have a rescue inhaler refill.  She is up-to-date on her pneumonia and flu vaccines.  I encouraged her to practice good hand hygiene this time a  year.  Plan: COPD: Continue Advair twice a day, the generic form of this is called Airduo, and we are happy to change your prescription if your insurance covers this Continue to use albuterol as needed for chest tightness wheezing or shortness of breath stay active Practice good hand hygiene this time a year  Recent community-acquired pneumonia: I am glad it sounds like you have recovered from this You have had both forms of the pneumonia vaccine in the last 5 years so there is no need for a repeat vaccination We will get a chest x-ray today to make sure everything has cleared up  Follow-up with me in 4-6 months or sooner if need  > 50% of this 25 minute visit spent face to face   Current Outpatient Medications:  .  ADVAIR DISKUS 250-50 MCG/DOSE AEPB, INHALE 1 PUFF BY MOUTH TWICE DAILY, Disp: 60 each, Rfl: 5 .  aspirin EC 81 MG EC tablet, Take 1 tablet (81 mg total) by mouth daily., Disp: , Rfl:  .  ferrous sulfate 325 (65 FE) MG tablet, Take 1 tablet (325 mg total) by mouth daily., Disp: 30 tablet, Rfl: 0 .  insulin NPH-regular Human (NOVOLIN 70/30) (70-30) 100 UNIT/ML injection, Inject 6 Units into the skin daily with breakfast., Disp: , Rfl:  .  levalbuterol (XOPENEX HFA) 45 MCG/ACT inhaler, Inhale 2 puffs into the lungs every 6 (six) hours as needed for wheezing or shortness of breath., Disp: 1 Inhaler, Rfl: 12 .  NONFORMULARY OR COMPOUNDED ITEM, Take 2 mg by mouth at bedtime. Budesonide viscous 1mg /64ml oral suspension.  Take 16 ml (2mg ) by mouth every night at bedtime. Take nothing by mouth for 1 hour after dose.  [address] Compounding [phone]., Disp: , Rfl:  .  oxybutynin (DITROPAN) 5 MG tablet, Take 5 mg by mouth 2 (two) times daily., Disp: , Rfl:  .  Vitamin D, Ergocalciferol, (DRISDOL) 50000 units CAPS capsule, Take 50,000 Units by mouth every 7 (seven) days. ON FRIDAYS, Disp: , Rfl:  .  albuterol (PROVENTIL HFA;VENTOLIN HFA) 108 (90 Base) MCG/ACT inhaler, Inhale 2 puffs  into the lungs every 6 (six) hours as needed for wheezing or shortness of breath., Disp: 1 Inhaler, Rfl: 11

## 2017-09-14 ENCOUNTER — Other Ambulatory Visit: Payer: Self-pay | Admitting: Internal Medicine

## 2017-11-12 ENCOUNTER — Encounter: Payer: Self-pay | Admitting: Pulmonary Disease

## 2017-11-12 ENCOUNTER — Ambulatory Visit: Payer: Medicare HMO | Admitting: Pulmonary Disease

## 2017-11-12 VITALS — BP 126/66 | HR 102 | Ht 61.0 in | Wt 119.0 lb

## 2017-11-12 DIAGNOSIS — J432 Centrilobular emphysema: Secondary | ICD-10-CM

## 2017-11-12 MED ORDER — FLUTICASONE-UMECLIDIN-VILANT 100-62.5-25 MCG/INH IN AEPB: 1.0000 | INHALATION_SPRAY | Freq: Every day | RESPIRATORY_TRACT | 0 refills | Status: DC

## 2017-11-12 NOTE — Patient Instructions (Signed)
COPD: Try taking Trelegy 1 puff daily no matter how you feel Do not take Advair or Spiriva while taking Trelegy If you think Trelegy is helpful call me and I can send in a prescription Use albuterol as needed for chest tightness wheezing or shortness of breath Exercise regularly stay active  We will see you back in 6 months or sooner if needed

## 2017-11-12 NOTE — Progress Notes (Signed)
Subjective:    Patient ID: Amanda [name], female    DOB: [birth year], 77 y.o.   MRN: [id number]  Synopsis> likely COPD, first seen by [facility] 2015. Also diagnosed with stage I colon cancer requiring a partial colectomy in 2015. July 2015 pulmonary function testing, clear airflow obstruction FEV1 37% predicted  HPI Chief Complaint  Patient presents with  . Follow-up    pt c/o stable DOE.     Amanda [name] says she is doing "pretty good".  She has had more dyspnea with the pollen, but not too bad.  No bronchitis or pneumonia.   She is still taking Advair and Spiriva, no problems with those.  She says that last month the pharmacist suggested that she use generic Advair, however this would be too expensive for her.  Past Medical History:  Diagnosis Date  . Arrhythmia    7-23-15x1beginning of colon surgery 01-12-14 - no problems since.  . Cancer ([place])    colon cancer 01-12-14- no further tx. intended  . Candida esophagitis ([place]) 01/09/2014  . Closed fracture of unspecified part of upper end of humerus   . Complete rupture of rotator cuff    right shoulder- limited range of motion  . COPD (chronic obstructive pulmonary disease) ([place])   . Emphysema lung ([place])   . Esophageal stricture 12/28/2013  . Essential hypertension, benign    off lisinporil for last 2 months  . GERD (gastroesophageal reflux disease)   . HCAP (healthcare-associated pneumonia) 03/28/2014  . Hx of adenomatous colonic polyps 05/15/2015  . Hyperlipidemia   . Neurogenic bladder, NOS    02-22-14 some bladder issues of urgency is somewhat improve.  . Other vitamin B12 deficiency anemia   . Type II or unspecified type diabetes mellitus without mention of complication, uncontrolled    diet controlled  . Vitamin D deficiency      Review of Systems  Constitutional: Negative for chills, fatigue and fever.  HENT: Negative for postnasal drip, rhinorrhea and sinus pressure.   Respiratory: Negative for cough, shortness of  breath and wheezing.   Cardiovascular: Negative for chest pain, palpitations and leg swelling.       Objective:   Physical Exam Vitals:   11/12/17 1530  BP: 126/66  Pulse: (!) 102  SpO2: 95%  Weight: 119 lb (54 kg)  Height: 5\' 1"  (1.549 m)  RA   Gen: chronically ill appearing HENT: OP clear, TM's clear, neck supple PULM: CTA B, normal percussion CV: RRR, no mgr, trace edema GI: BS+, soft, nontender Derm: no cyanosis or rash Psyche: normal mood and affect   CBC    Component Value Date/Time   WBC 6.7 08/10/2016 0803   RBC 3.80 (L) 08/10/2016 0803   HGB 10.8 (L) 08/10/2016 0803   HGB 13.4 02/15/2015 1133   HCT 32.8 (L) 08/10/2016 0803   HCT 40.8 02/15/2015 1133   PLT 244 08/10/2016 0803   PLT 333 02/15/2015 1133   MCV 86.3 08/10/2016 0803   MCV 89.7 02/15/2015 1133   MCH 28.4 08/10/2016 0803   MCHC 32.9 08/10/2016 0803   RDW 12.7 08/10/2016 0803   RDW 13.2 02/15/2015 1133   LYMPHSABS 0.9 08/09/2016 1744   LYMPHSABS 1.6 02/15/2015 1133   MONOABS 0.7 08/09/2016 1744   MONOABS 0.6 02/15/2015 1133   EOSABS 0.2 08/09/2016 1744   EOSABS 0.3 02/15/2015 1133   BASOSABS 0.1 08/09/2016 1744   BASOSABS 0.1 02/15/2015 1133   Chest imaging: Multiple CT scans in 2016 showed resolving pneumonia with  a persistent scar in the right upper lobe January 2019 chest x-ray shows right upper lobe scar and emphysema     Assessment & Plan:   Centrilobular emphysema ([place])  Discussion: Amanda [name] comes to clinic today for a follow-up on her COPD.  This has been a stable interval for her.  She is interested in trying a combination inhaler for simplicity and hopefully lower financial cost.  Plan: COPD: Try taking Trelegy 1 puff daily no matter how you feel Do not take Advair or Spiriva while taking Trelegy If you think Trelegy is helpful call me and I can send in a prescription Use albuterol as needed for chest tightness wheezing or shortness of breath Exercise regularly stay  active  We will see you back in 6 months or sooner if needed   Current Outpatient Medications:  .  ADVAIR DISKUS 250-50 MCG/DOSE AEPB, INHALE 1 PUFF BY MOUTH TWICE DAILY, Disp: 60 each, Rfl: 5 .  albuterol (PROVENTIL HFA;VENTOLIN HFA) 108 (90 Base) MCG/ACT inhaler, Inhale 2 puffs into the lungs every 6 (six) hours as needed for wheezing or shortness of breath., Disp: 1 Inhaler, Rfl: 11 .  aspirin EC 81 MG EC tablet, Take 1 tablet (81 mg total) by mouth daily., Disp: , Rfl:  .  ferrous sulfate 325 (65 FE) MG tablet, Take 1 tablet (325 mg total) by mouth daily., Disp: 30 tablet, Rfl: 0 .  insulin NPH-regular Human (NOVOLIN 70/30) (70-30) 100 UNIT/ML injection, Inject 6 Units into the skin daily with breakfast., Disp: , Rfl:  .  levalbuterol (XOPENEX HFA) 45 MCG/ACT inhaler, Inhale 2 puffs into the lungs every 6 (six) hours as needed for wheezing or shortness of breath., Disp: 1 Inhaler, Rfl: 12 .  NONFORMULARY OR COMPOUNDED ITEM, Take 2 mg by mouth at bedtime. Budesonide viscous 1mg /24ml oral suspension.  Take 16 ml (2mg ) by mouth every night at bedtime. Take nothing by mouth for 1 hour after dose.  [address] Compounding [phone]., Disp: , Rfl:  .  omeprazole (PRILOSEC) 40 MG capsule, TAKE ONE CAPSULE BY MOUTH TWICE DAILY 30 MINUTES BEFORE BREAKFAST AND SUPPER, Disp: 60 capsule, Rfl: 1 .  oxybutynin (DITROPAN) 5 MG tablet, Take 5 mg by mouth 2 (two) times daily., Disp: , Rfl:  .  Vitamin D, Ergocalciferol, (DRISDOL) 50000 units CAPS capsule, Take 50,000 Units by mouth every 7 (seven) days. ON FRIDAYS, Disp: , Rfl:  .  Fluticasone-Umeclidin-Vilant (TRELEGY ELLIPTA) 100-62.5-25 MCG/INH AEPB, Inhale 1 puff into the lungs daily., Disp: 1 each, Rfl: 0

## 2017-11-22 ENCOUNTER — Telehealth: Payer: Self-pay | Admitting: Pulmonary Disease

## 2017-11-22 MED ORDER — FLUTICASONE-UMECLIDIN-VILANT 100-62.5-25 MCG/INH IN AEPB: 1.0000 | INHALATION_SPRAY | Freq: Every day | RESPIRATORY_TRACT | 4 refills | Status: DC

## 2017-11-22 NOTE — Telephone Encounter (Signed)
Spoke with patient. She stated that she would like to continue on the Trelegy. She wishes to have this RX sent to [facility] on [address]. Advised patient that I would this in for her.   Nothing else needed at time of call.

## 2017-11-27 ENCOUNTER — Other Ambulatory Visit: Payer: Self-pay | Admitting: Internal Medicine

## 2018-01-07 ENCOUNTER — Telehealth: Payer: Self-pay

## 2018-01-07 NOTE — Telephone Encounter (Signed)
Amanda [name] called from [place] [facility],  [phone] on behalf of Dr Valentino Saxon. He scoped her today and he wants to discuss local options for this patient per her request. He is going to call back next week to discuss this with Dr Carlean Purl.

## 2018-01-10 NOTE — Telephone Encounter (Signed)
Needs me to dilate  Due in  March  [place]

## 2018-01-11 NOTE — Telephone Encounter (Signed)
Amanda [name] at [facility] is faxing her records over.

## 2018-01-12 NOTE — Telephone Encounter (Signed)
Let patient know I am happy to dilate her when ready - I spoke to Dr. Kris Mouton  Please place a recall for February 2020 to anticipate dilation in March is when Dr. Kris Mouton suggested she be dilated next  Have her call me prn dysphagia otherwise

## 2018-01-12 NOTE — Telephone Encounter (Signed)
Records received and placed on Dr Celesta Aver desk.

## 2018-01-13 NOTE — Telephone Encounter (Signed)
Patient informed and recall placed.

## 2018-01-13 NOTE — Telephone Encounter (Signed)
Left message for her to call me back. 

## 2018-01-25 ENCOUNTER — Other Ambulatory Visit: Payer: Self-pay | Admitting: Internal Medicine

## 2018-01-28 ENCOUNTER — Other Ambulatory Visit: Payer: Self-pay | Admitting: Internal Medicine

## 2018-02-14 ENCOUNTER — Telehealth: Payer: Self-pay | Admitting: Pulmonary Disease

## 2018-02-14 MED ORDER — ALBUTEROL SULFATE HFA 108 (90 BASE) MCG/ACT IN AERS: 2.0000 | INHALATION_SPRAY | Freq: Four times a day (QID) | RESPIRATORY_TRACT | 11 refills | Status: DC | PRN

## 2018-02-14 NOTE — Telephone Encounter (Signed)
Spoke with pt. She is requesting a new prescription for Ventolin to be sent to her pharmacy. Pt would like this to be for the generic. Rx has been sent in.

## 2018-03-07 ENCOUNTER — Other Ambulatory Visit: Payer: Self-pay | Admitting: Internal Medicine

## 2018-05-20 ENCOUNTER — Encounter: Payer: Self-pay | Admitting: Pulmonary Disease

## 2018-05-20 ENCOUNTER — Ambulatory Visit: Payer: Medicare HMO | Admitting: Pulmonary Disease

## 2018-05-20 VITALS — BP 136/74 | HR 75 | Ht 61.22 in | Wt 115.0 lb

## 2018-05-20 DIAGNOSIS — J432 Centrilobular emphysema: Secondary | ICD-10-CM

## 2018-05-20 MED ORDER — FLUTICASONE-UMECLIDIN-VILANT 100-62.5-25 MCG/INH IN AEPB: 1.0000 | INHALATION_SPRAY | Freq: Every day | RESPIRATORY_TRACT | 0 refills | Status: DC

## 2018-05-20 NOTE — Progress Notes (Signed)
Subjective:    Patient ID: Amanda [name], female    DOB: 1941/01/26, 77 y.o.   MRN: [id number]  Synopsis> likely COPD, first seen by [facility] 2015. Also diagnosed with stage I colon cancer requiring a partial colectomy in 2015. July 2015 pulmonary function testing, clear airflow obstruction FEV1 37% predicted  HPI Chief Complaint  Patient presents with  . Follow-up    patient reports breathing same   [name] returns to clinic today stating that there is been some confusion over which medicines she is supposed to take.  She says that her breathing has been stable.  She has not had an exacerbation of her COPD since the last visit.  That being said, she says that her cough and chest congestion have been stable.  She says that her confusion lies in which medicine she is supposed to be taking.  She thought that she understood our office to tell her that albuterol was an adequate substitution for Trelegy though we documented quite a different story.  She has been taking albuterol 3-4 times a day.  Past Medical History:  Diagnosis Date  . Arrhythmia    7-23-15x1beginning of colon surgery 01-12-14 - no problems since.  . Cancer ([place])    colon cancer 01-12-14- no further tx. intended  . Candida esophagitis ([place]) 01/09/2014  . Closed fracture of unspecified part of upper end of humerus   . Complete rupture of rotator cuff    right shoulder- limited range of motion  . COPD (chronic obstructive pulmonary disease) ([place])   . Emphysema lung ([place])   . Esophageal stricture 12/28/2013  . Essential hypertension, benign    off lisinporil for last 2 months  . GERD (gastroesophageal reflux disease)   . HCAP (healthcare-associated pneumonia) 03/28/2014  . Hx of adenomatous colonic polyps 05/15/2015  . Hyperlipidemia   . Neurogenic bladder, NOS    02-22-14 some bladder issues of urgency is somewhat improve.  . Other vitamin B12 deficiency anemia   . Type II or unspecified type diabetes mellitus  without mention of complication, uncontrolled    diet controlled  . Vitamin D deficiency      Review of Systems  Constitutional: Negative for chills, fatigue and fever.  HENT: Negative for postnasal drip, rhinorrhea and sinus pressure.   Respiratory: Negative for cough, shortness of breath and wheezing.   Cardiovascular: Negative for chest pain, palpitations and leg swelling.       Objective:   Physical Exam Vitals:   05/20/18 1139  BP: 136/74  Pulse: 75  SpO2: 94%  Weight: 115 lb (52.2 kg)  Height: 5' 1.22" (1.555 m)  RA   Gen: chronically ill appearing HENT: OP clear, TM's clear, neck supple PULM: Wheezing bilaterally B, normal percussion CV: RRR, no mgr, trace edema GI: BS+, soft, nontender Derm: no cyanosis or rash Psyche: normal mood and affect    CBC    Component Value Date/Time   WBC 6.7 08/10/2016 0803   RBC 3.80 (L) 08/10/2016 0803   HGB 10.8 (L) 08/10/2016 0803   HGB 13.4 02/15/2015 1133   HCT 32.8 (L) 08/10/2016 0803   HCT 40.8 02/15/2015 1133   PLT 244 08/10/2016 0803   PLT 333 02/15/2015 1133   MCV 86.3 08/10/2016 0803   MCV 89.7 02/15/2015 1133   MCH 28.4 08/10/2016 0803   MCHC 32.9 08/10/2016 0803   RDW 12.7 08/10/2016 0803   RDW 13.2 02/15/2015 1133   LYMPHSABS 0.9 08/09/2016 1744   LYMPHSABS 1.6 02/15/2015 1133  MONOABS 0.7 08/09/2016 1744   MONOABS 0.6 02/15/2015 1133   EOSABS 0.2 08/09/2016 1744   EOSABS 0.3 02/15/2015 1133   BASOSABS 0.1 08/09/2016 1744   BASOSABS 0.1 02/15/2015 1133   Chest imaging: Multiple CT scans in 2016 showed resolving pneumonia with a persistent scar in the right upper lobe January 2019 chest x-ray shows right upper lobe scar and emphysema     Assessment & Plan:   Centrilobular emphysema ([place])  Discussion: This has been a stable interval for Winda.  We spent a significant amount of time talking about the cost of medicines and the difference between controller and rescue medicines.  I explained to her  that a very poor substitution for Trelegy would be to use albuterol 3-4 times a day but I do not recommend it.  She is willing to try Trelegy again.  Plan: Severe COPD: Take Trelegy 1 puff daily no matter how you feel If you cannot afford Trelegy then take albuterol 3-4 times a day I am glad that you had a flu shot Practice good hand hygiene Stay active  Follow-up in 6 months or sooner if needed  Greater than 50% of this 15-minute visit was spent in direct consultation today talking to her about the expense of Trelegy   Current Outpatient Medications:  .  albuterol (PROVENTIL HFA;VENTOLIN HFA) 108 (90 Base) MCG/ACT inhaler, Inhale 2 puffs into the lungs every 6 (six) hours as needed for wheezing or shortness of breath., Disp: 1 Inhaler, Rfl: 11 .  aspirin EC 81 MG EC tablet, Take 1 tablet (81 mg total) by mouth daily., Disp: , Rfl:  .  ferrous sulfate 325 (65 FE) MG tablet, Take 1 tablet (325 mg total) by mouth daily., Disp: 30 tablet, Rfl: 0 .  insulin NPH-regular Human (NOVOLIN 70/30) (70-30) 100 UNIT/ML injection, Inject 6 Units into the skin daily with breakfast., Disp: , Rfl:  .  metoprolol succinate (TOPROL-XL) 25 MG 24 hr tablet, Take 25 mg by mouth daily., Disp: , Rfl: 3 .  NONFORMULARY OR COMPOUNDED ITEM, Take 2 mg by mouth at bedtime. Budesonide viscous 1mg /5ml oral suspension.  Take 16 ml (2mg ) by mouth every night at bedtime. Take nothing by mouth for 1 hour after dose.  [address] Compounding [phone]., Disp: , Rfl:  .  omeprazole (PRILOSEC) 40 MG capsule, Take 1 capsule by mouth twice daily 30 minutes before breakfast and supper., Disp: 60 capsule, Rfl: 2 .  oxybutynin (DITROPAN) 5 MG tablet, Take 5 mg by mouth 2 (two) times daily., Disp: , Rfl:  .  Vitamin D, Ergocalciferol, (DRISDOL) 50000 units CAPS capsule, Take 50,000 Units by mouth every 7 (seven) days. ON FRIDAYS, Disp: , Rfl:  .  Fluticasone-Umeclidin-Vilant (TRELEGY ELLIPTA) 100-62.5-25 MCG/INH AEPB, Inhale 1  puff into the lungs daily. (Patient not taking: Reported on 05/20/2018), Disp: 1 each, Rfl: 4 .  Fluticasone-Umeclidin-Vilant (TRELEGY ELLIPTA) 100-62.5-25 MCG/INH AEPB, Inhale 1 puff into the lungs daily., Disp: 1 each, Rfl: 0

## 2018-05-20 NOTE — Patient Instructions (Signed)
Severe COPD: Take Trelegy 1 puff daily no matter how you feel If you cannot afford Trelegy then take albuterol 3-4 times a day I am glad that you had a flu shot Practice good hand hygiene Stay active  Follow-up in 6 months or sooner if needed

## 2018-05-25 IMAGING — DX DG CHEST 2V
2 series · 2 of 2 positions shown · non-contrast
Comparison: 08/10/2016

CLINICAL DATA: Follow-up pneumonia, diabetes mellitus, COPD, former
smoker

EXAM:
CHEST  2 VIEW

[chest pa]
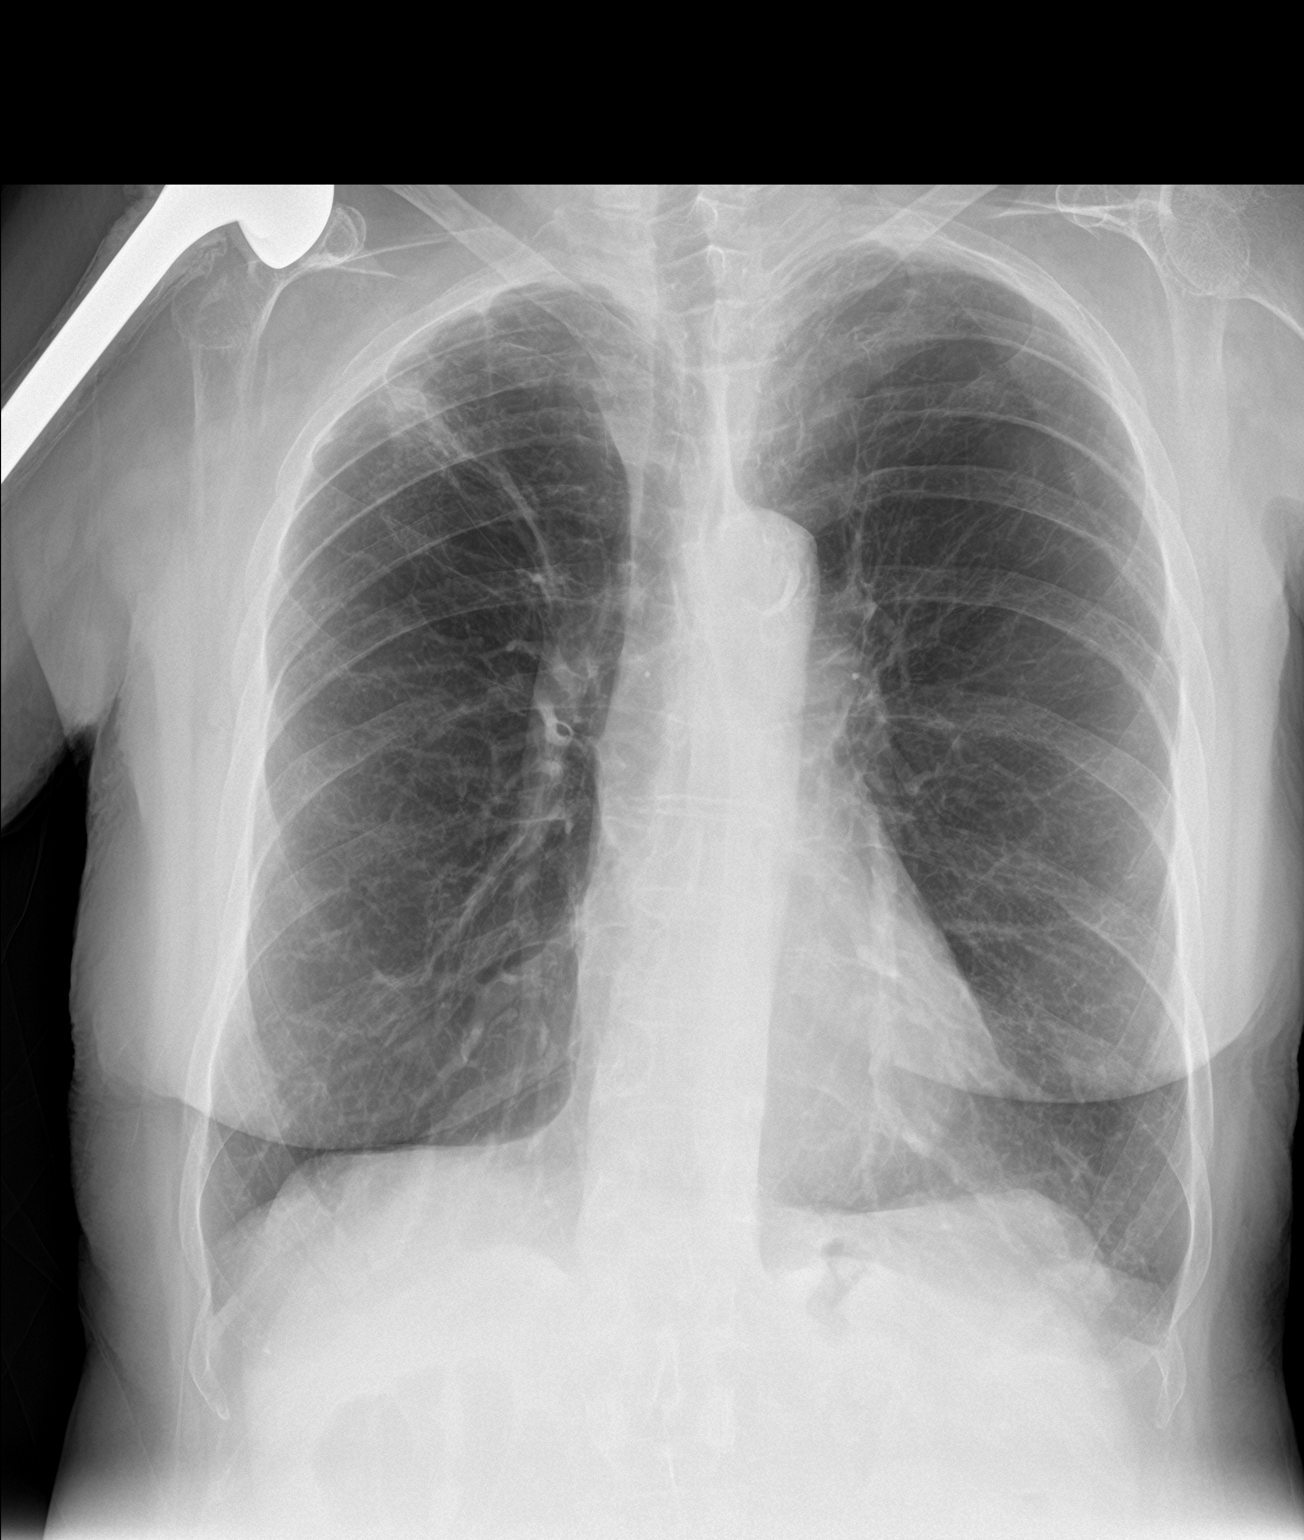

[chest lat]
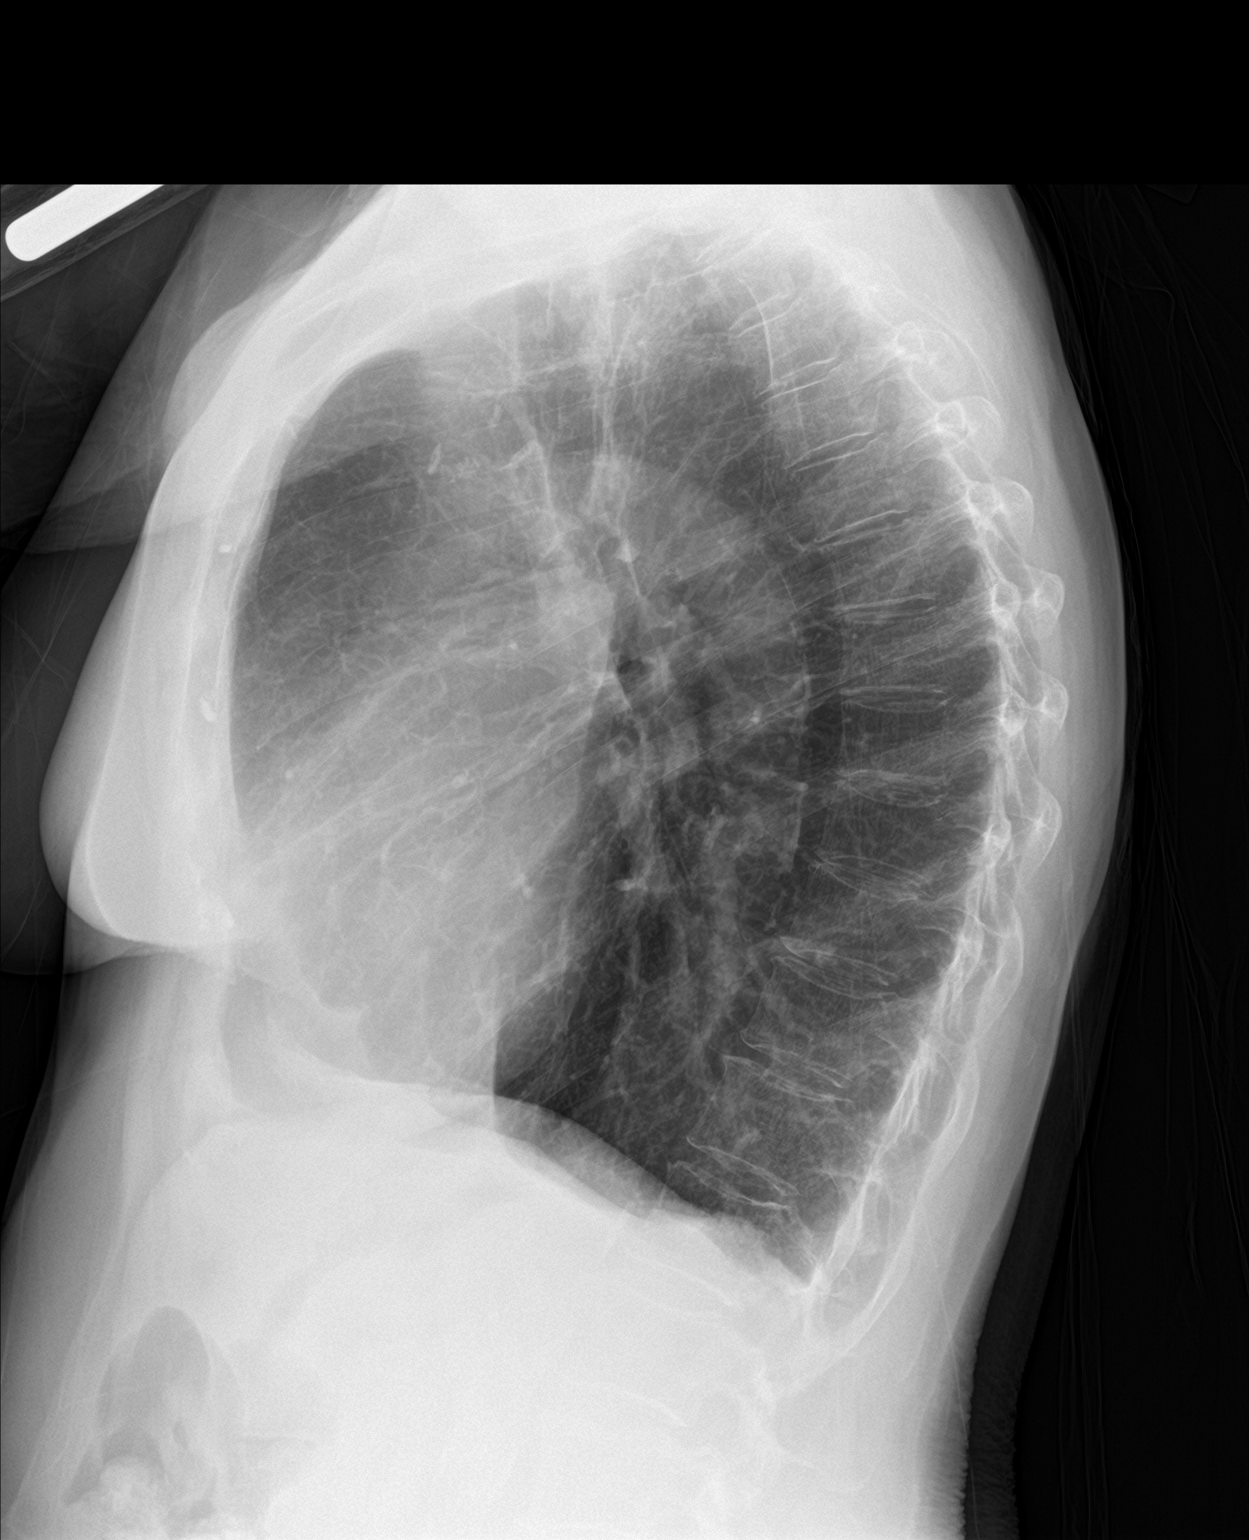

[2 of 2 positions shown; findings below may reference images not displayed]

FINDINGS: Normal heart size, mediastinal contours, and pulmonary vascularity.

Atherosclerotic calcification aorta.

RIGHT upper lobe scarring.

Emphysematous and bronchitic changes consistent with COPD.

No acute infiltrate, pleural effusion or pneumothorax.

Bones demineralized with note of a RIGHT shoulder prosthesis and
pseudoarthrosis at the RIGHT shoulder/clavicle.
IMPRESSION: COPD changes with RIGHT upper lobe scarring.

No acute abnormalities.

## 2018-07-04 ENCOUNTER — Other Ambulatory Visit: Payer: Self-pay

## 2018-07-04 MED ORDER — OMEPRAZOLE 40 MG PO CPDR: DELAYED_RELEASE_CAPSULE | ORAL | 2 refills | Status: DC

## 2018-07-04 NOTE — Telephone Encounter (Signed)
Omeprazole refilled as pharmacy requested. Patient scoped over the summer at [facility]

## 2018-09-01 ENCOUNTER — Encounter: Payer: Self-pay | Admitting: Internal Medicine

## 2018-09-05 ENCOUNTER — Other Ambulatory Visit: Payer: Self-pay | Admitting: Pulmonary Disease

## 2018-09-30 ENCOUNTER — Ambulatory Visit (AMBULATORY_SURGERY_CENTER): Payer: Self-pay

## 2018-09-30 ENCOUNTER — Encounter: Payer: Self-pay | Admitting: Internal Medicine

## 2018-09-30 VITALS — Ht 61.0 in | Wt 121.0 lb

## 2018-09-30 DIAGNOSIS — K222 Esophageal obstruction: Secondary | ICD-10-CM

## 2018-09-30 NOTE — Progress Notes (Signed)
Denies allergies to eggs or soy products. Denies complication of anesthesia or sedation. Denies use of weight loss medication. Denies use of O2.   Emmi instructions declined.  

## 2018-10-11 ENCOUNTER — Encounter: Payer: Self-pay | Admitting: Internal Medicine

## 2018-10-11 ENCOUNTER — Telehealth: Payer: Self-pay | Admitting: Internal Medicine

## 2018-10-11 NOTE — Telephone Encounter (Signed)
She is on fluconazole per daughter and started this 2 days ago- mouth and throat not sore - white patches to throat .

## 2018-10-11 NOTE — Telephone Encounter (Signed)
Dr Carlean Purl,  Is Mrs Amanda [name] to proceed with her EGD on Friday with thrush?  Please advise, Thanks so much, Lelan Pons

## 2018-10-11 NOTE — Telephone Encounter (Signed)
Pt's daughter called to inform that pt has thrush and is taking an antifungal.  Pt is scheduled for endo savary 3.13.20.  Please advise.

## 2018-10-12 ENCOUNTER — Telehealth: Payer: Self-pay | Admitting: Internal Medicine

## 2018-10-12 NOTE — Telephone Encounter (Signed)
Informed daughter ok to scope per Dr Carlean Purl, someone had cancelled her procedure for Fri 3-13 at 930 am  so I rescheduled her same day, same time, and Lattie Haw, daughter aware  Lelan Pons pV

## 2018-10-12 NOTE — Telephone Encounter (Signed)
OK to scope

## 2018-10-14 ENCOUNTER — Encounter: Payer: Medicare HMO | Admitting: Internal Medicine

## 2018-10-14 ENCOUNTER — Ambulatory Visit (AMBULATORY_SURGERY_CENTER): Payer: Medicare HMO | Admitting: Internal Medicine

## 2018-10-14 ENCOUNTER — Encounter: Payer: Self-pay | Admitting: Internal Medicine

## 2018-10-14 ENCOUNTER — Other Ambulatory Visit: Payer: Self-pay

## 2018-10-14 VITALS — BP 161/83 | HR 54 | Temp 98.4°F | Resp 20 | Ht 61.0 in | Wt 121.0 lb

## 2018-10-14 DIAGNOSIS — K222 Esophageal obstruction: Secondary | ICD-10-CM

## 2018-10-14 MED ORDER — SODIUM CHLORIDE 0.9 % IV SOLN: 500.0000 mL | Freq: Once | INTRAVENOUS | Status: DC

## 2018-10-14 NOTE — Patient Instructions (Addendum)
    I dilated to 16 mm just like Dr. Kris Mouton did last year.  I hope the cornbread goes down ok.  No signs of thrush.  Will plan on contacting you in about 6 months.  I appreciate the opportunity to care for you. Gatha Mayer, MD, FACG  YOU HAD AN ENDOSCOPIC PROCEDURE TODAY AT [facility]:   Refer to the procedure report that was given to you for any specific questions about what was found during the examination.  If the procedure report does not answer your questions, please call your gastroenterologist to clarify.  If you requested that your care partner not be given the details of your procedure findings, then the procedure report has been included in a sealed envelope for you to review at your convenience later.  YOU SHOULD EXPECT: Some feelings of bloating in the abdomen. Passage of more gas than usual.  Walking can help get rid of the air that was put into your GI tract during the procedure and reduce the bloating.  Please Note:  You might notice some irritation and congestion in your nose or some drainage.  This is from the oxygen used during your procedure.  There is no need for concern and it should clear up in a day or so.  SYMPTOMS TO REPORT IMMEDIATELY:   Following upper endoscopy (EGD)  Vomiting of blood or coffee ground material  New chest pain or pain under the shoulder blades  Painful or persistently difficult swallowing  New shortness of breath  Fever of 100F or higher  Black, tarry-looking stools  For urgent or emergent issues, a gastroenterologist can be reached at any hour by calling [phone].   DIET: follow dilation diet- see handout  ACTIVITY:  You should plan to take it easy for the rest of today and you should NOT DRIVE or use heavy machinery until tomorrow (because of the sedation medicines used during the test).    FOLLOW UP: Our staff will call the number listed on your records the next business day following your  procedure to check on you and address any questions or concerns that you may have regarding the information given to you following your procedure. If we do not reach you, we will leave a message.  However, if you are feeling well and you are not experiencing any problems, there is no need to return our call.  We will assume that you have returned to your regular daily activities without incident.   SIGNATURES/CONFIDENTIALITY: You and/or your care partner have signed paperwork which will be entered into your electronic medical record.  These signatures attest to the fact that that the information above on your After Visit Summary has been reviewed and is understood.  Full responsibility of the confidentiality of this discharge information lies with you and/or your care-partner.  See handout and follow dilation diet today  Continue your normal medications

## 2018-10-14 NOTE — Op Note (Addendum)
[facility] Patient Name: Amanda [name] Procedure Date: 10/14/2018 9:40 AM MRN: [id number] Endoscopist: Gatha Mayer , MD Age: 78 Referring MD:  Date of Birth: [birth year] Gender: Female Account #: [id number] Procedure:                Upper GI endoscopy Indications:              Dysphagia, Stricture of the esophagus, For therapy                            of esophageal stricture Medicines:                Propofol per Anesthesia, Monitored Anesthesia Care Procedure:                Pre-Anesthesia Assessment:                           - Prior to the procedure, a History and Physical                            was performed, and patient medications and                            allergies were reviewed. The patient's tolerance of                            previous anesthesia was also reviewed. The risks                            and benefits of the procedure and the sedation                            options and risks were discussed with the patient.                            All questions were answered, and informed consent                            was obtained. Prior Anticoagulants: The patient has                            taken no previous anticoagulant or antiplatelet                            agents. ASA Grade Assessment: III - A patient with                            severe systemic disease. After reviewing the risks                            and benefits, the patient was deemed in                            satisfactory condition to undergo the procedure.  After obtaining informed consent, the endoscope was                            passed under direct vision. Throughout the                            procedure, the patient's blood pressure, pulse, and                            oxygen saturations were monitored continuously. The                            Endoscope was introduced through the mouth, and   advanced to the antrum of the stomach. The upper GI                            endoscopy was accomplished without difficulty. The                            patient tolerated the procedure well. Scope In: Scope Out: Findings:                 Multiple benign-appearing, intrinsic mild stenoses                            were found. The stenoses were traversed. A                            guidewire was placed and the scope was withdrawn.                            Dilation was performed with a Savary dilator with                            mild resistance at 14 mm. A guidewire was placed                            under fluoroscopic guidance and the scope was                            withdrawn. Dilation was performed with a Savary                            dilator with mild resistance at 15 mm. A guidewire                            was placed and the scope was withdrawn. Dilation                            was performed with a Savary dilator with mild                            resistance at 16 mm. The dilation site was examined  following endoscope reinsertion and showed mild                            mucosal disruption. Estimated blood loss was                            minimal.                           The exam was otherwise without abnormality.                           The cardia and gastric fundus were normal on                            retroflexion. Complications:            No immediate complications. Estimated Blood Loss:     Estimated blood loss was minimal. Impression:               - Benign-appearing esophageal stenoses. Multiple                            rings w/ hx lymphocytic esophagitis. Dilated. Two                            most significant proximal esophagus and GE                            junctuion - dilated to 16 mm as above.                           - The examination was otherwise normal.                           - No specimens  collected. Recommendation:           - Patient has a contact number available for                            emergencies. The signs and symptoms of potential                            delayed complications were discussed with the                            patient. Return to normal activities tomorrow.                            Written discharge instructions were provided to the                            patient.                           - Continue present medications.                           -  Repeat upper endoscopy 6-12 months for                            retreatment. Recall at 6 months and see how she is                            doing (will contact)                           - Clear liquids x 1 hour then soft foods rest of                            day. Start prior diet tomorrow.                           IN RECOVERRY SHE SHOWED ME A 2017 BOTTLE OF VISCOUS                            BUDESONIDE - SAYS WAS TAKING 1 MG QHS BUT I CANNOT                            SEE HOW BOTTLE WOULD LAST THAT LONG IF SO                           WILL DISCUSS NECESSITY OF CONTINUING THAT W/ DR.                            Paul Half, MD 10/14/2018 10:14:52 AM This report has been signed electronically.

## 2018-10-14 NOTE — Progress Notes (Signed)
Pt drowsy. VSS. Report to RN. No anesthetic complications noted

## 2018-10-14 NOTE — Progress Notes (Signed)
Called to room to assist during endoscopic procedure.  Patient ID and intended procedure confirmed with present staff. Received instructions for my participation in the procedure from the performing physician.  

## 2018-10-14 NOTE — Progress Notes (Signed)
Pt's states no medical or surgical changes since previsit or office visit, except pt says she has thrush diagnosed and put on medication 2-3 weeks ago by NP. Dr Carlean Purl informed that pt currently on Diflucans for candida infection . Pt says she has been diagnosed with Thrush since she was here for her pre visit.

## 2018-10-17 ENCOUNTER — Telehealth: Payer: Self-pay

## 2018-10-17 ENCOUNTER — Telehealth: Payer: Self-pay | Admitting: *Deleted

## 2018-10-17 NOTE — Telephone Encounter (Signed)
  Follow up Call-  Call back number 10/14/2018  Post procedure Call Back phone  # [phone]  Permission to leave phone message Yes  Some recent data might be hidden     Patient questions:  Do you have a fever, pain , or abdominal swelling? No. Pain Score  0 *  Have you tolerated food without any problems? Yes.    Have you been able to return to your normal activities? Yes.    Do you have any questions about your discharge instructions: Diet   No. Medications  No. Follow up visit  No.  Do you have questions or concerns about your Care? No.  Actions: * If pain score is 4 or above: No action needed, pain <4.

## 2018-10-17 NOTE — Telephone Encounter (Signed)
Called # [phone] and left a messaged we tried to reach pt for a follow up call. maw

## 2018-10-21 ENCOUNTER — Other Ambulatory Visit: Payer: Self-pay

## 2018-10-21 ENCOUNTER — Encounter: Payer: Self-pay | Admitting: Pulmonary Disease

## 2018-10-21 ENCOUNTER — Ambulatory Visit: Payer: Medicare HMO | Admitting: Pulmonary Disease

## 2018-10-21 VITALS — BP 118/70 | HR 58 | Ht 61.0 in | Wt 116.0 lb

## 2018-10-21 DIAGNOSIS — J432 Centrilobular emphysema: Secondary | ICD-10-CM | POA: Diagnosis not present

## 2018-10-21 NOTE — Progress Notes (Signed)
Subjective:    Patient ID: Amanda [name], female    DOB: 17-Mar-1941, 78 y.o.   MRN: [id number]  Synopsis> likely COPD, first seen by [facility] 2015. Also diagnosed with stage I colon cancer requiring a partial colectomy in 2015. July 2015 pulmonary function testing, clear airflow obstruction FEV1 37% predicted  HPI No chief complaint on file.  Amanda [name] says that she has been doing well since the last visit.  She continues to take Trelegy.  She says that the co-pay right now is around $47 and that is affordable for her but she is concerned that it may go up later in the year.  She had thrush recently, had her esophagus stretched by Dr. Arelia Longest up to 16 mm.  She is now swallowing fine.  She was prescribed Diflucan at that time because of the thrush.  No problems with chest tightness wheezing or shortness of breath.  She says the Trelegy works well for her.  She says that she has not had bronchitis or pneumonia since last visit.  Past Medical History:  Diagnosis Date  . Arrhythmia    7-23-15x1beginning of colon surgery 01-12-14 - no problems since.  . Blood transfusion without reported diagnosis   . Cancer ([place])    colon cancer 01-12-14- no further tx. intended  . Candida esophagitis ([place]) 01/09/2014  . Cataract   . Closed fracture of unspecified part of upper end of humerus   . Complete rupture of rotator cuff    right shoulder- limited range of motion  . COPD (chronic obstructive pulmonary disease) ([place])   . Emphysema lung ([place])   . Emphysema of lung ([place])   . Esophageal stricture 12/28/2013  . Essential hypertension, benign    off lisinporil for last 2 months  . GERD (gastroesophageal reflux disease)   . HCAP (healthcare-associated pneumonia) 03/28/2014  . Hx of adenomatous colonic polyps 05/15/2015  . Hyperlipidemia   . Neurogenic bladder, NOS    02-22-14 some bladder issues of urgency is somewhat improve.  . Other vitamin B12 deficiency anemia   . Type II or unspecified type  diabetes mellitus without mention of complication, uncontrolled    diet controlled  . Vitamin D deficiency      Review of Systems  Constitutional: Negative for chills, fatigue and fever.  HENT: Negative for postnasal drip, rhinorrhea and sinus pressure.   Respiratory: Negative for cough, shortness of breath and wheezing.   Cardiovascular: Negative for chest pain, palpitations and leg swelling.       Objective:   Physical Exam Vitals:   10/21/18 1427  BP: 118/70  Pulse: (!) 58  SpO2: 94%  Weight: 116 lb (52.6 kg)  Height: 5\' 1"  (1.549 m)  RA   Gen: chronically ill appearing HENT: OP clear, TM's clear, neck supple PULM: CTA B, normal percussion CV: RRR, no mgr, trace edema GI: BS+, soft, nontender Derm: no cyanosis or rash Psyche: normal mood and affect     CBC    Component Value Date/Time   WBC 6.7 08/10/2016 0803   RBC 3.80 (L) 08/10/2016 0803   HGB 10.8 (L) 08/10/2016 0803   HGB 13.4 02/15/2015 1133   HCT 32.8 (L) 08/10/2016 0803   HCT 40.8 02/15/2015 1133   PLT 244 08/10/2016 0803   PLT 333 02/15/2015 1133   MCV 86.3 08/10/2016 0803   MCV 89.7 02/15/2015 1133   MCH 28.4 08/10/2016 0803   MCHC 32.9 08/10/2016 0803   RDW 12.7 08/10/2016 0803   RDW 13.2  02/15/2015 1133   LYMPHSABS 0.9 08/09/2016 1744   LYMPHSABS 1.6 02/15/2015 1133   MONOABS 0.7 08/09/2016 1744   MONOABS 0.6 02/15/2015 1133   EOSABS 0.2 08/09/2016 1744   EOSABS 0.3 02/15/2015 1133   BASOSABS 0.1 08/09/2016 1744   BASOSABS 0.1 02/15/2015 1133   Chest imaging: Multiple CT scans in 2016 showed resolving pneumonia with a persistent scar in the right upper lobe January 2019 chest x-ray shows right upper lobe scar and emphysema     Assessment & Plan:   Centrilobular emphysema ([place])  Discussion: This has been a stable interval for Amanda [name], she has not had an exacerbation since the last visit.  She is compliant with her Trelegy.  It seems to be working well for her.  She had thrush  recently so I encouraged her to rinse her mouth frequently.  We talked about the importance of hand hygiene right now with the worldwide pandemic of coronavirus.  Plan: COPD: Continue taking Trelegy 1 puff daily no matter how you feel Continue to use albuterol as needed for chest tightness wheezing or shortness of breath Practice good hand hygiene Do not touch your face Stay active Stay away from crowds Stay away from sick people  We will see you back in 6 months or sooner if needed   Current Outpatient Medications:  .  albuterol (PROVENTIL HFA;VENTOLIN HFA) 108 (90 Base) MCG/ACT inhaler, Inhale 2 puffs into the lungs every 6 (six) hours as needed for wheezing or shortness of breath., Disp: 1 Inhaler, Rfl: 11 .  aspirin EC 81 MG EC tablet, Take 1 tablet (81 mg total) by mouth daily., Disp: , Rfl:  .  ferrous sulfate 325 (65 FE) MG tablet, Take 1 tablet (325 mg total) by mouth daily., Disp: 30 tablet, Rfl: 0 .  fluconazole (DIFLUCAN) 10 MG/ML suspension, Take by mouth daily., Disp: , Rfl:  .  insulin NPH-regular Human (NOVOLIN 70/30) (70-30) 100 UNIT/ML injection, Inject 6 Units into the skin daily with breakfast., Disp: , Rfl:  .  metoprolol succinate (TOPROL-XL) 25 MG 24 hr tablet, Take 25 mg by mouth daily., Disp: , Rfl: 3 .  omeprazole (PRILOSEC) 40 MG capsule, Take 1 capsule by mouth twice daily 30 minutes before breakfast and supper., Disp: 60 capsule, Rfl: 2 .  oxybutynin (DITROPAN) 5 MG tablet, Take 5 mg by mouth 2 (two) times daily., Disp: , Rfl:  .  TRELEGY ELLIPTA 100-62.5-25 MCG/INH AEPB, INHALE 1 PUFF ONCE DAILY, Disp: 60 each, Rfl: 5 .  Vitamin D, Ergocalciferol, (DRISDOL) 50000 units CAPS capsule, Take 50,000 Units by mouth every 7 (seven) days. ON FRIDAYS, Disp: , Rfl:  .  NONFORMULARY OR COMPOUNDED ITEM, Take 2 mg by mouth at bedtime. Budesonide viscous 1mg /91ml oral suspension.  Take 16 ml (2mg ) by mouth every night at bedtime. Take nothing by mouth for 1 hour after dose.   [address] Compounding [phone]., Disp: , Rfl:

## 2018-10-21 NOTE — Patient Instructions (Signed)
COPD: Continue taking Trelegy 1 puff daily no matter how you feel Continue to use albuterol as needed for chest tightness wheezing or shortness of breath Practice good hand hygiene Do not touch your face Stay active Stay away from crowds Stay away from sick people  We will see you back in 6 months or sooner if needed

## 2018-10-28 ENCOUNTER — Encounter: Payer: Self-pay | Admitting: Internal Medicine

## 2018-10-31 ENCOUNTER — Other Ambulatory Visit: Payer: Self-pay | Admitting: Internal Medicine

## 2019-03-18 ENCOUNTER — Other Ambulatory Visit: Payer: Self-pay | Admitting: Pulmonary Disease

## 2019-04-07 ENCOUNTER — Other Ambulatory Visit: Payer: Self-pay | Admitting: Internal Medicine

## 2019-04-11 ENCOUNTER — Telehealth: Payer: Self-pay | Admitting: Internal Medicine

## 2019-04-11 NOTE — Telephone Encounter (Signed)
I left the patient a message that I was asking about her dysphagia and if she needed another dilation as her EGD recall note has come around  Asked her to call back

## 2019-04-11 NOTE — Telephone Encounter (Signed)
She called back and said absolutely no dysphagia  Please place a recall for 06/2019 EGD to replace September recall

## 2019-04-13 NOTE — Telephone Encounter (Signed)
Recall placed for 06/04/2019 as instructed.

## 2019-05-09 ENCOUNTER — Other Ambulatory Visit: Payer: Self-pay | Admitting: Pulmonary Disease

## 2019-05-11 NOTE — Telephone Encounter (Signed)
ATC pt to let her know her Trelegy has been refilled, line rang busy x1.

## 2019-05-11 NOTE — Telephone Encounter (Signed)
Pt calling to check the status of her refill of Trelegy that was to be sent to [facility] on [address]

## 2019-05-19 ENCOUNTER — Encounter: Payer: Self-pay | Admitting: Internal Medicine

## 2019-05-29 ENCOUNTER — Other Ambulatory Visit: Payer: Self-pay | Admitting: Gastroenterology

## 2019-06-09 ENCOUNTER — Encounter: Payer: Self-pay | Admitting: Critical Care Medicine

## 2019-06-09 ENCOUNTER — Other Ambulatory Visit: Payer: Self-pay

## 2019-06-09 ENCOUNTER — Ambulatory Visit (INDEPENDENT_AMBULATORY_CARE_PROVIDER_SITE_OTHER): Payer: Medicare HMO | Admitting: Critical Care Medicine

## 2019-06-09 VITALS — BP 136/80 | HR 94 | Temp 98.2°F | Ht 61.0 in | Wt 116.0 lb

## 2019-06-09 DIAGNOSIS — B370 Candidal stomatitis: Secondary | ICD-10-CM | POA: Diagnosis not present

## 2019-06-09 DIAGNOSIS — J449 Chronic obstructive pulmonary disease, unspecified: Secondary | ICD-10-CM | POA: Diagnosis not present

## 2019-06-09 MED ORDER — TRELEGY ELLIPTA 100-62.5-25 MCG/INH IN AEPB: 1.0000 | INHALATION_SPRAY | Freq: Every day | RESPIRATORY_TRACT | 12 refills | Status: DC

## 2019-06-09 MED ORDER — ALBUTEROL SULFATE HFA 108 (90 BASE) MCG/ACT IN AERS: 2.0000 | INHALATION_SPRAY | Freq: Four times a day (QID) | RESPIRATORY_TRACT | 11 refills | Status: AC | PRN

## 2019-06-09 MED ORDER — NYSTATIN 100000 UNIT/ML MT SUSP: 5.0000 mL | Freq: Two times a day (BID) | OROMUCOSAL | 0 refills | Status: DC

## 2019-06-09 NOTE — Progress Notes (Signed)
Synopsis: Referred in May 2015 for COPD by Tamsen Roers, MD.  Formerly a patient of Dr. Lake Bells.  Subjective:   PATIENT ID: Amanda [name] GENDER: female DOB: 04-Dec-1940, MRN: [id number]  Chief Complaint  Patient presents with   Follow-up    Amanda [name] is an 78 year old woman who presents for follow-up for COPD.  She has been doing well on Trelegy, which was started in March 2020.  She has had a few episodes of thrush recently, which have responded to nystatin oral solution.  They resolved for several weeks before coming back.  She is on 70/30 insulin, but never checks her blood sugar.  She reports that her most recent A1c was around 7.  Her insulin is managed by her primary care provider.  She is able to walk around as much as she wants; she thinks she could walk for an hour or 2.  She denies dyspnea on exertion, sputum production, wheezing, cough.  Her GERD is well controlled with omeprazole daily.  She has had esophageal strictures requiring multiple dilations in the past, by Dr. Arelia Longest [facility].  She is up-to-date on her seasonal flu and pneumonia vaccines.  She is complaining of dizziness associated with some of her meds-oxybutynin, omeprazole, hydrochlorothiazide.  This occurs after taking her morning meds, but is not associated to position changes.      Past Medical History:  Diagnosis Date   Arrhythmia    7-23-15x1beginning of colon surgery 01-12-14 - no problems since.   Blood transfusion without reported diagnosis    Cancer ([place])    colon cancer 01-12-14- no further tx. intended   Candida esophagitis ([place]) 01/09/2014   Cataract    Closed fracture of unspecified part of upper end of humerus    Complete rupture of rotator cuff    right shoulder- limited range of motion   COPD (chronic obstructive pulmonary disease) (HCC)    Emphysema lung (HCC)    Emphysema of lung ([place])    Esophageal stricture 12/28/2013   Essential hypertension, benign    off  lisinporil for last 2 months   GERD (gastroesophageal reflux disease)    HCAP (healthcare-associated pneumonia) 03/28/2014   Hx of adenomatous colonic polyps 05/15/2015   Hyperlipidemia    Neurogenic bladder, NOS    02-22-14 some bladder issues of urgency is somewhat improve.   Other vitamin B12 deficiency anemia    Type II or unspecified type diabetes mellitus without mention of complication, uncontrolled    diet controlled   Vitamin D deficiency      Family History  Problem Relation Age of Onset   Diabetes Father    Bone cancer Father        bone marrow   Emphysema Mother    Pneumonia Mother    Colon cancer Neg Hx    Esophageal cancer Neg Hx    Stomach cancer Neg Hx    Rectal cancer Neg Hx      Past Surgical History:  Procedure Laterality Date   BALLOON DILATION N/A 03/02/2014   Procedure: BALLOON DILATION;  Surgeon: Gatha Mayer, MD;  Location: [facility];  Service: Endoscopy;  Laterality: N/A;   CATARACT EXTRACTION, BILATERAL Bilateral    COLON SURGERY     COLONOSCOPY N/A 01/10/2014   Procedure: COLONOSCOPY;  Surgeon: [name], MD;  Location: [facility];  Service: Endoscopy;  Laterality: N/A;   DILATION AND CURETTAGE OF UTERUS     ESOPHAGOGASTRODUODENOSCOPY N/A 01/16/2014   Procedure:  ESOPHAGOGASTRODUODENOSCOPY (EGD);  Surgeon: Gatha Mayer, MD;  Location: [facility];  Service: Endoscopy;  Laterality: N/A;   ESOPHAGOGASTRODUODENOSCOPY N/A 03/02/2014   Procedure: ESOPHAGOGASTRODUODENOSCOPY (EGD);  Surgeon: Gatha Mayer, MD;  Location: [facility];  Service: Endoscopy;  Laterality: N/A;   ESOPHAGOGASTRODUODENOSCOPY N/A 08/21/2015   Procedure: ESOPHAGOGASTRODUODENOSCOPY (EGD);  Surgeon: Gatha Mayer, MD;  Location: [facility];  Service: Endoscopy;  Laterality: N/A;   ESOPHAGOGASTRODUODENOSCOPY (EGD) WITH PROPOFOL N/A 01/09/2014   Procedure: ESOPHAGOGASTRODUODENOSCOPY (EGD) WITH PROPOFOL;  Surgeon: [name], MD;  Location: [facility];  Service: Endoscopy;  Laterality: N/A;   ESOPHAGOGASTRODUODENOSCOPY (EGD) WITH PROPOFOL N/A 01/19/2014   Procedure: ESOPHAGOGASTRODUODENOSCOPY (EGD) WITH PROPOFOL;  Surgeon: Gatha Mayer, MD;  Location: [facility];  Service: Endoscopy;  Laterality: N/A;   EYE SURGERY Bilateral 2010   both eyes lens replacments   LAPAROSCOPIC PARTIAL COLECTOMY N/A 01/12/2014   Procedure: LAPAROSCOPIC ASSISTED PARTIAL COLECTOMY AND REMOVAL OF RECTAL POLYP;  Surgeon: Odis Hollingshead, MD;  Location: [facility];  Service: General;  Laterality: N/A;   ORIF SHOULDER FRACTURE Right 2011   arthroplasty   SAVORY DILATION N/A 08/21/2015   Procedure: SAVORY DILATION;  Surgeon: Gatha Mayer, MD;  Location: [facility];  Service: Endoscopy;  Laterality: N/A;   TONSILLECTOMY AND ADENOIDECTOMY  age 20 or 44    Social History   Socioeconomic History   Marital status: Married    Spouse name: Not on file   Number of children: 4   Years of education: Not on file   Highest education level: Not on file  Occupational History   Occupation: retired    Fish farm manager: RETIRED  Scientist, product/process development strain: Not on file   Food insecurity    Worry: Not on file    Inability: Not on file   Transportation needs    Medical: Not on file    Non-medical: Not on file  Tobacco Use   Smoking status: Former Smoker    Packs/day: 1.00    Years: 20.00    Pack years: 20.00    Types: Cigarettes    Quit date: 08/03/2005    Years since quitting: 13.8   Smokeless tobacco: Never Used  Substance and Sexual Activity   Alcohol use: No    Alcohol/week: 0.0 standard drinks   Drug use: No   Sexual activity: Never  Lifestyle   Physical activity    Days per week: Not on file    Minutes per session: Not on file   Stress: Not on file  Relationships   Social connections    Talks on phone: Not on file    Gets together: Not on file    Attends religious service: Not on file    Active member of club or  organization: Not on file    Attends meetings of clubs or organizations: Not on file    Relationship status: Not on file   Intimate partner violence    Fear of current or ex partner: Not on file    Emotionally abused: Not on file    Physically abused: Not on file    Forced sexual activity: Not on file  Other Topics Concern   Not on file  Social History Narrative   Divorced and retired Insurance claims handler. 2 sons 2 daughters. 2 caffeinated beverages daily.   Lives in [place].     No Known Allergies   Immunization History  Administered Date(s) Administered   Influenza Split 05/09/2015   Influenza, High Dose  Seasonal PF 05/30/2013, 05/18/2016, 04/20/2019   Influenza,inj,Quad PF,6+ Mos 04/04/2014   Influenza-Unspecified 05/27/2017, 05/03/2018   Pneumococcal Conjugate-13 04/04/2014   Pneumococcal Polysaccharide-23 08/31/2011    Outpatient Medications Prior to Visit  Medication Sig Dispense Refill   aspirin EC 81 MG EC tablet Take 1 tablet (81 mg total) by mouth daily.     ferrous sulfate 325 (65 FE) MG tablet Take 1 tablet (325 mg total) by mouth daily. 30 tablet 0   fluconazole (DIFLUCAN) 10 MG/ML suspension Take by mouth daily.     hydrochlorothiazide (MICROZIDE) 12.5 MG capsule Take 12.5 mg by mouth every morning.     insulin NPH-regular Human (NOVOLIN 70/30) (70-30) 100 UNIT/ML injection Inject 6 Units into the skin daily with breakfast.     metoprolol succinate (TOPROL-XL) 25 MG 24 hr tablet Take 25 mg by mouth daily.  3   NONFORMULARY OR COMPOUNDED ITEM Take 2 mg by mouth at bedtime. Budesonide viscous 1mg /5ml oral suspension.  Take 16 ml (2mg ) by mouth every night at bedtime. Take nothing by mouth for 1 hour after dose.  [facility] [phone].     omeprazole (PRILOSEC) 40 MG capsule TAKE 1 CAPSULE BY MOUTH TWICE DAILY 30  MINUTES  BEFORE  BREAKFAST  AND  SUPPER 60 capsule 0   oxybutynin (DITROPAN) 5 MG tablet Take 5 mg by mouth 2 (two) times daily.      Vitamin D, Ergocalciferol, (DRISDOL) 50000 units CAPS capsule Take 50,000 Units by mouth every 7 (seven) days. ON FRIDAYS     albuterol (PROVENTIL HFA;VENTOLIN HFA) 108 (90 Base) MCG/ACT inhaler Inhale 2 puffs into the lungs every 6 (six) hours as needed for wheezing or shortness of breath. 1 Inhaler 11   TRELEGY ELLIPTA 100-62.5-25 MCG/INH AEPB INHALE 1 PUFF ONCE DAILY 60 each 0   No facility-administered medications prior to visit.     Review of Systems  Constitutional: Negative for chills, fever and weight loss.  HENT: Negative.   Eyes: Negative.   Respiratory: Negative for cough, sputum production, shortness of breath and wheezing.   Cardiovascular: Negative for chest pain and leg swelling.  Gastrointestinal: Negative for blood in stool, heartburn, nausea and vomiting.  Genitourinary: Negative.   Musculoskeletal: Negative.   Neurological: Positive for dizziness.       Attributes dizziness to meds     Objective:   Vitals:   06/09/19 1333  BP: 136/80  Pulse: 94  Temp: 98.2 F (36.8 C)  TempSrc: Oral  SpO2: 100%  Weight: 116 lb (52.6 kg)  Height: 5\' 1"  (1.549 m)   100% on   RA BMI Readings from Last 3 Encounters:  06/09/19 21.92 kg/m  10/21/18 21.92 kg/m  10/14/18 22.86 kg/m   Wt Readings from Last 3 Encounters:  06/09/19 116 lb (52.6 kg)  10/21/18 116 lb (52.6 kg)  10/14/18 121 lb (54.9 kg)    Physical Exam Vitals signs reviewed.  Constitutional:      General: She is not in acute distress.    Appearance: Normal appearance. She is not ill-appearing.  HENT:     Head: Normocephalic and atraumatic.     Mouth/Throat:     Comments: White plaques on soft palate Eyes:     General: No scleral icterus. Neck:     Musculoskeletal: Neck supple.  Cardiovascular:     Rate and Rhythm: Normal rate and regular rhythm.     Heart sounds: No murmur.  Pulmonary:     Comments: Breathing comfortably on room air,  no conversational dyspnea or tachypnea.  Clear to  auscultation bilaterally. Abdominal:     General: There is no distension.     Palpations: Abdomen is soft.  Musculoskeletal:        General: No swelling or deformity.  Lymphadenopathy:     Cervical: No cervical adenopathy.  Skin:    General: Skin is warm and dry.     Findings: No rash.  Neurological:     General: No focal deficit present.     Mental Status: She is alert.     Motor: No weakness.     Coordination: Coordination normal.  Psychiatric:        Mood and Affect: Mood normal.        Behavior: Behavior normal.        Chest Imaging- films reviewed: CXR, 2 view 08/27/2017- right shoulder arthroplasty, right upper lobe opacity peripherally with bands extending towards the hilum.  Smaller compared to January 2018 suggesting scar.  Flattened hemidiaphragms.  CT chest 02/15/2015-centrilobular emphysema, upper lobe predominant.  Large right upper lobe opacity with air bronchograms in a similar distribution to opacity present on more recent chest x-rays.  No significant adenopathy.  Pulmonary Functions Testing Results: PFT Results Latest Ref Rng & Units 02/01/2014  FVC-Pre L 1.61  FVC-Predicted Pre % 65  FVC-Post L 1.64  FVC-Predicted Post % 66  Pre FEV1/FVC % % 39  Post FEV1/FCV % % 42  FEV1-Pre L 0.63  FEV1-Predicted Pre % 34  FEV1-Post L 0.69  DLCO UNC% % 20  DLCO COR %Predicted % 34  TLC L 5.10  TLC % Predicted % 110  RV % Predicted % 163   Severe obstruction without significant bronchodilator reversibility. No hyperinflation.  Severely reduced diffusion capacity.   Echocardiogram 01/2014:  - Left ventricle: The cavity size was normal. Systolic function was normal. The estimated ejection fraction was in the range of 50% to 55%. Wall motion was normal; there were no regional wall motion abnormalities. Left ventricular diastolic function parameters were normal. - Aortic valve: Trileaflet; normal thickness leaflets. There was no regurgitation. - Mitral valve:  Mildly thickened leaflets . There was mild regurgitation. - Right ventricle: Systolic function was normal. - Right atrium: The atrium was normal in size. - Tricuspid valve: There was mild regurgitation. - Pulmonary arteries: Systolic pressure was within the normal range. - Pericardium, extracardiac: There was no pericardial effusion.     Assessment & Plan:     ICD-10-CM   1. COPD, severe ([place])  J44.9 nystatin (MYCOSTATIN) 100000 UNIT/ML suspension    albuterol (VENTOLIN HFA) 108 (90 Base) MCG/ACT inhaler    Fluticasone-Umeclidin-Vilant (TRELEGY ELLIPTA) 100-62.5-25 MCG/INH AEPB  2. Thrush  B37.0 nystatin (MYCOSTATIN) 100000 UNIT/ML suspension    Severe COPD-doing well -Continue Trelegy once daily.  Reminded to rinse her mouth after every use.Marland Kitchen  Refills provided today. -Continue albuterol as needed-refills provided today. -Up-to-date on seasonal flu and pneumonia vaccines -Continue social distancing, mask wearing, handwashing per COVID-19 recommendations -Continue regular physical activity  Oral thrush -Nystatin swish and swallow twice daily for 10 days -If this recurs we may need to interrupt her triple therapy to a LAMA/ LABA for a month-would prescribe Anoro once daily. -Recommended that she brush her teeth, gums, roof of her mouth, tongue and gargle with water or mouthwash after every use of Trelegy. -Recommend she start checking her blood sugars.  If she is regularly having hypoglycemia this could explain recurrent thrush.  This is occurring, she should discuss with her  PCP.  Dizziness -Recommended that she discuss with her primary care provider.  I recommended that she stay on omeprazole and hydrochlorothiazide, but it would be fine to stop oxybutynin.  She should expect that her overactive bladder will return.  She has an appointment with her PCP on 11/30.  RTC in 6 months.   Current Outpatient Medications:    albuterol (VENTOLIN HFA) 108 (90 Base) MCG/ACT inhaler,  Inhale 2 puffs into the lungs every 6 (six) hours as needed for wheezing or shortness of breath., Disp: 6.7 g, Rfl: 11   aspirin EC 81 MG EC tablet, Take 1 tablet (81 mg total) by mouth daily., Disp: , Rfl:    ferrous sulfate 325 (65 FE) MG tablet, Take 1 tablet (325 mg total) by mouth daily., Disp: 30 tablet, Rfl: 0   fluconazole (DIFLUCAN) 10 MG/ML suspension, Take by mouth daily., Disp: , Rfl:    hydrochlorothiazide (MICROZIDE) 12.5 MG capsule, Take 12.5 mg by mouth every morning., Disp: , Rfl:    insulin NPH-regular Human (NOVOLIN 70/30) (70-30) 100 UNIT/ML injection, Inject 6 Units into the skin daily with breakfast., Disp: , Rfl:    metoprolol succinate (TOPROL-XL) 25 MG 24 hr tablet, Take 25 mg by mouth daily., Disp: , Rfl: 3   NONFORMULARY OR COMPOUNDED ITEM, Take 2 mg by mouth at bedtime. Budesonide viscous 1mg /41ml oral suspension.  Take 16 ml (2mg ) by mouth every night at bedtime. Take nothing by mouth for 1 hour after dose.  [address] Compounding [phone]., Disp: , Rfl:    omeprazole (PRILOSEC) 40 MG capsule, TAKE 1 CAPSULE BY MOUTH TWICE DAILY 30  MINUTES  BEFORE  BREAKFAST  AND  SUPPER, Disp: 60 capsule, Rfl: 0   oxybutynin (DITROPAN) 5 MG tablet, Take 5 mg by mouth 2 (two) times daily., Disp: , Rfl:    Vitamin D, Ergocalciferol, (DRISDOL) 50000 units CAPS capsule, Take 50,000 Units by mouth every 7 (seven) days. ON FRIDAYS, Disp: , Rfl:    Fluticasone-Umeclidin-Vilant (TRELEGY ELLIPTA) 100-62.5-25 MCG/INH AEPB, Inhale 1 puff into the lungs daily., Disp: 1 each, Rfl: 12   nystatin (MYCOSTATIN) 100000 UNIT/ML suspension, Take 5 mLs (500,000 Units total) by mouth 2 (two) times daily., Disp: 120 mL, Rfl: 0   Julian Hy, DO [facility] 06/09/2019 2:15 PM

## 2019-06-09 NOTE — Patient Instructions (Addendum)
Thank you for visiting Dr. Carlis Abbott at [facility]. We recommend the following:  Brush your teeth, gums, roof of your mouth, and tongue and gargle with water or mouthwash after every use of Trelegy.  Meds ordered this encounter  Medications  . nystatin (MYCOSTATIN) 100000 UNIT/ML suspension    Sig: Take 5 mLs (500,000 Units total) by mouth 2 (two) times daily.    Dispense:  120 mL    Refill:  0  . albuterol (VENTOLIN HFA) 108 (90 Base) MCG/ACT inhaler    Sig: Inhale 2 puffs into the lungs every 6 (six) hours as needed for wheezing or shortness of breath.    Dispense:  6.7 g    Refill:  11    Please fill as generic  . Fluticasone-Umeclidin-Vilant (TRELEGY ELLIPTA) 100-62.5-25 MCG/INH AEPB    Sig: Inhale 1 puff into the lungs daily.    Dispense:  1 each    Refill:  12    Return in about 6 months (around 12/07/2019).    Please do your part to reduce the spread of COVID-19.

## 2019-08-03 ENCOUNTER — Emergency Department (HOSPITAL_COMMUNITY): Payer: Medicare HMO

## 2019-08-03 ENCOUNTER — Other Ambulatory Visit: Payer: Self-pay

## 2019-08-03 ENCOUNTER — Encounter (HOSPITAL_COMMUNITY): Payer: Self-pay

## 2019-08-03 ENCOUNTER — Emergency Department (HOSPITAL_COMMUNITY)
Admission: EM | Admit: 2019-08-03 | Discharge: 2019-08-03 | Disposition: A | Discharge status: Home / Self Care | Payer: Medicare HMO | Attending: Emergency Medicine | Admitting: Emergency Medicine

## 2019-08-03 DIAGNOSIS — S39012A Strain of muscle, fascia and tendon of lower back, initial encounter: Secondary | ICD-10-CM | POA: Insufficient documentation

## 2019-08-03 DIAGNOSIS — W010XXA Fall on same level from slipping, tripping and stumbling without subsequent striking against object, initial encounter: Secondary | ICD-10-CM | POA: Diagnosis not present

## 2019-08-03 DIAGNOSIS — Z79899 Other long term (current) drug therapy: Secondary | ICD-10-CM | POA: Insufficient documentation

## 2019-08-03 DIAGNOSIS — Y999 Unspecified external cause status: Secondary | ICD-10-CM | POA: Insufficient documentation

## 2019-08-03 DIAGNOSIS — Z87891 Personal history of nicotine dependence: Secondary | ICD-10-CM | POA: Diagnosis not present

## 2019-08-03 DIAGNOSIS — Y929 Unspecified place or not applicable: Secondary | ICD-10-CM | POA: Insufficient documentation

## 2019-08-03 DIAGNOSIS — I10 Essential (primary) hypertension: Secondary | ICD-10-CM | POA: Insufficient documentation

## 2019-08-03 DIAGNOSIS — J449 Chronic obstructive pulmonary disease, unspecified: Secondary | ICD-10-CM | POA: Diagnosis not present

## 2019-08-03 DIAGNOSIS — Y939 Activity, unspecified: Secondary | ICD-10-CM | POA: Insufficient documentation

## 2019-08-03 DIAGNOSIS — S3992XA Unspecified injury of lower back, initial encounter: Secondary | ICD-10-CM | POA: Diagnosis present

## 2019-08-03 DIAGNOSIS — Z7982 Long term (current) use of aspirin: Secondary | ICD-10-CM | POA: Diagnosis not present

## 2019-08-03 DIAGNOSIS — W19XXXA Unspecified fall, initial encounter: Secondary | ICD-10-CM

## 2019-08-03 MED ORDER — TRAMADOL HCL 50 MG PO TABS: 50.0000 mg | ORAL_TABLET | Freq: Four times a day (QID) | ORAL | 0 refills | Status: DC | PRN

## 2019-08-03 MED ORDER — TRAMADOL HCL 50 MG PO TABS
50.0000 mg | ORAL_TABLET | Freq: Once | ORAL | Status: AC
  Administered 2019-08-03: 10:00:00 50 mg via ORAL
  Filled 2019-08-03: qty 1

## 2019-08-03 MED ORDER — ACETAMINOPHEN 325 MG PO TABS
650.0000 mg | ORAL_TABLET | Freq: Once | ORAL | Status: AC
  Administered 2019-08-03: 650 mg via ORAL
  Filled 2019-08-03: qty 2

## 2019-08-03 NOTE — ED Provider Notes (Signed)
[facility] DEPT Provider Note   CSN: [id number] Arrival date & time: 08/03/19  [id number]     History Chief Complaint  Patient presents with  . Fall    Amanda [name] is a 78 y.o. female hx of COPD, HL, HTN, DM here presenting with fall.  Patient states that Amanda [name] was on the porch and slipped and fell backwards and hit her upper back.  Amanda [name] had no loss of consciousness or head injury at that time .  C-collar was put by EMS even though Amanda [name] did not have any head injury or neck pain.  Patient denies any chest pain.  Patient states that Amanda [name] has a history of COPD but is not on oxygen at baseline.  Denies any heart problems in the past.  The history is provided by the patient.       Past Medical History:  Diagnosis Date  . Arrhythmia    7-23-15x1beginning of colon surgery 01-12-14 - no problems since.  . Blood transfusion without reported diagnosis   . Cancer ([place])    colon cancer 01-12-14- no further tx. intended  . Candida esophagitis ([place]) 01/09/2014  . Cataract   . Closed fracture of unspecified part of upper end of humerus   . Complete rupture of rotator cuff    right shoulder- limited range of motion  . COPD (chronic obstructive pulmonary disease) ([place])   . Emphysema lung ([place])   . Emphysema of lung ([place])   . Esophageal stricture 12/28/2013  . Essential hypertension, benign    off lisinporil for last 2 months  . GERD (gastroesophageal reflux disease)   . HCAP (healthcare-associated pneumonia) 03/28/2014  . Hx of adenomatous colonic polyps 05/15/2015  . Hyperlipidemia   . Neurogenic bladder, NOS    02-22-14 some bladder issues of urgency is somewhat improve.  . Other vitamin B12 deficiency anemia   . Type II or unspecified type diabetes mellitus without mention of complication, uncontrolled    diet controlled  . Vitamin D deficiency     Patient Active Problem List   Diagnosis Date Noted  . Abnormal CXR 09/18/2016  . RSV bronchiolitis 08/10/2016  .  COPD exacerbation ([place]) 08/09/2016  . Community acquired pneumonia 08/09/2016  . Hyponatremia 08/09/2016  . Hyperkalemia 08/09/2016  . Hyperbilirubinemia 08/09/2016  . Esophageal stricture   . Hx of adenomatous colonic polyps 05/15/2015  . Chronic respiratory failure with hypoxia ([place]) 05/18/2014  . Sepsis, unspecified organism ([place]) 03/28/2014  . Atrial fibrillation with RVR ([place]) 03/28/2014  . Right colon cancer (Stage I) s/p partial colectomy 01/12/14 01/10/2014  . Stricture and stenosis of esophagus 01/09/2014  . COPD (chronic obstructive pulmonary disease) ([place]) 01/08/2014  . GERD (gastroesophageal reflux disease)   . Hyperlipidemia   . Type 2 diabetes mellitus with complication, with long-term current use of insulin ([place])   . Essential hypertension, benign     Past Surgical History:  Procedure Laterality Date  . BALLOON DILATION N/A 03/02/2014   Procedure: BALLOON DILATION;  Surgeon: Gatha Mayer, MD;  Location: [facility];  Service: Endoscopy;  Laterality: N/A;  . CATARACT EXTRACTION, BILATERAL Bilateral   . COLON SURGERY    . COLONOSCOPY N/A 01/10/2014   Procedure: COLONOSCOPY;  Surgeon: [name], MD;  Location: [facility];  Service: Endoscopy;  Laterality: N/A;  . DILATION AND CURETTAGE OF UTERUS    . ESOPHAGOGASTRODUODENOSCOPY N/A 01/16/2014   Procedure: ESOPHAGOGASTRODUODENOSCOPY (EGD);  Surgeon: Gatha Mayer, MD;  Location: [facility];  Service: Endoscopy;  Laterality: N/A;  . ESOPHAGOGASTRODUODENOSCOPY N/A 03/02/2014   Procedure: ESOPHAGOGASTRODUODENOSCOPY (EGD);  Surgeon: Gatha Mayer, MD;  Location: [facility];  Service: Endoscopy;  Laterality: N/A;  . ESOPHAGOGASTRODUODENOSCOPY N/A 08/21/2015   Procedure: ESOPHAGOGASTRODUODENOSCOPY (EGD);  Surgeon: Gatha Mayer, MD;  Location: [facility];  Service: Endoscopy;  Laterality: N/A;  . ESOPHAGOGASTRODUODENOSCOPY (EGD) WITH PROPOFOL N/A 01/09/2014   Procedure: ESOPHAGOGASTRODUODENOSCOPY (EGD) WITH  PROPOFOL;  Surgeon: [name], MD;  [facility];  Service: Endoscopy;  Laterality: N/A;  . ESOPHAGOGASTRODUODENOSCOPY (EGD) WITH PROPOFOL N/A 01/19/2014   Procedure: ESOPHAGOGASTRODUODENOSCOPY (EGD) WITH PROPOFOL;  Surgeon: Gatha Mayer, MD;  Location: [facility];  Service: Endoscopy;  Laterality: N/A;  . EYE SURGERY Bilateral 2010   both eyes lens replacments  . LAPAROSCOPIC PARTIAL COLECTOMY N/A 01/12/2014   Procedure: LAPAROSCOPIC ASSISTED PARTIAL COLECTOMY AND REMOVAL OF RECTAL POLYP;  Surgeon: Odis Hollingshead, MD;  Location: [facility];  Service: General;  Laterality: N/A;  . ORIF SHOULDER FRACTURE Right 2011   arthroplasty  . SAVORY DILATION N/A 08/21/2015   Procedure: SAVORY DILATION;  Surgeon: Gatha Mayer, MD;  [facility];  Service: Endoscopy;  Laterality: N/A;  . TONSILLECTOMY AND ADENOIDECTOMY  age 15 or 3     OB History   No obstetric history on file.     Family History  Problem Relation Age of Onset  . Diabetes Father   . Bone cancer Father        bone marrow  . Emphysema Mother   . Pneumonia Mother   . Colon cancer Neg Hx   . Esophageal cancer Neg Hx   . Stomach cancer Neg Hx   . Rectal cancer Neg Hx     Social History   Tobacco Use  . Smoking status: Former Smoker    Packs/day: 1.00    Years: 20.00    Pack years: 20.00    Types: Cigarettes    Quit date: 08/03/2005    Years since quitting: 14.0  . Smokeless tobacco: Never Used  Substance Use Topics  . Alcohol use: No    Alcohol/week: 0.0 standard drinks  . Drug use: No    Home Medications Prior to Admission medications   Medication Sig Start Date End Date Taking? Authorizing Provider  albuterol (VENTOLIN HFA) 108 (90 Base) MCG/ACT inhaler Inhale 2 puffs into the lungs every 6 (six) hours as needed for wheezing or shortness of breath. 06/09/19   Julian Hy, DO  aspirin EC 81 MG EC tablet Take 1 tablet (81 mg total) by mouth daily. 11/09/14   Belva Crome, MD  ferrous  sulfate 325 (65 FE) MG tablet Take 1 tablet (325 mg total) by mouth daily. 01/01/14   Drenda Freeze, MD  fluconazole (DIFLUCAN) 10 MG/ML suspension Take by mouth daily.    [provider]  Fluticasone-Umeclidin-Vilant (TRELEGY ELLIPTA) 100-62.5-25 MCG/INH AEPB Inhale 1 puff into the lungs daily. 06/09/19   Julian Hy, DO  hydrochlorothiazide (MICROZIDE) 12.5 MG capsule Take 12.5 mg by mouth every morning. 06/02/19   [provider]  insulin NPH-regular Human (NOVOLIN 70/30) (70-30) 100 UNIT/ML injection Inject 6 Units into the skin daily with breakfast.    [provider]  metoprolol succinate (TOPROL-XL) 25 MG 24 hr tablet Take 25 mg by mouth daily. 04/21/18   [provider]  NONFORMULARY OR COMPOUNDED ITEM Take 2 mg by mouth at bedtime. Budesonide viscous 1mg /3ml oral suspension.  Take 16 ml (2mg ) by mouth every night at  bedtime. Take nothing by mouth for 1 hour after dose.  [employer] Compounding [phone].    [provider]  nystatin (MYCOSTATIN) 100000 UNIT/ML suspension Take 5 mLs (500,000 Units total) by mouth 2 (two) times daily. 06/09/19   Julian Hy, DO  omeprazole (PRILOSEC) 40 MG capsule TAKE 1 CAPSULE BY MOUTH TWICE DAILY 30  MINUTES  BEFORE  BREAKFAST  AND  SUPPER 05/29/19   Gatha Mayer, MD  oxybutynin (DITROPAN) 5 MG tablet Take 5 mg by mouth 2 (two) times daily. 01/23/16   [provider]  Vitamin D, Ergocalciferol, (DRISDOL) 50000 units CAPS capsule Take 50,000 Units by mouth every 7 (seven) days. ON FRIDAYS    [provider]    Allergies    Patient has no known allergies.  Review of Systems   Review of Systems  Musculoskeletal:       L scapula pain, L back pain   All other systems reviewed and are negative.   Physical Exam Updated Vital Signs BP (!) 160/111   Pulse 87   Temp 98 F (36.7 C) (Oral)   Resp 16   Ht 5\' 1"  (1.549 m)   Wt 52.2 kg   SpO2 97%   BMI 21.73 kg/m   Physical  Exam Vitals and nursing note reviewed.  Constitutional:      Appearance: Normal appearance.  HENT:     Head: Normocephalic and atraumatic.     Comments: No scalp hematoma     Mouth/Throat:     Mouth: Mucous membranes are moist.  Eyes:     Extraocular Movements: Extraocular movements intact.     Pupils: Pupils are equal, round, and reactive to light.  Neck:     Comments: C collar in place but no midline tenderness  Cardiovascular:     Rate and Rhythm: Normal rate and regular rhythm.     Pulses: Normal pulses.     Heart sounds: Normal heart sounds.  Pulmonary:     Effort: Pulmonary effort is normal.     Breath sounds: Normal breath sounds.     Comments: + tenderness L scapula area, no ecchymosis or bruising, no midline thoracic tenderness  Abdominal:     General: Abdomen is flat.     Palpations: Abdomen is soft.     Comments: No bruising, nontender   Musculoskeletal:     Cervical back: Normal range of motion and neck supple.     Comments: + L paralumbar tenderness, no CVAT, nl ROM bilateral hips, no obvious extremity trauma   Skin:    General: Skin is warm.     Capillary Refill: Capillary refill takes less than 2 seconds.  Neurological:     General: No focal deficit present.     Mental Status: Amanda [name] is alert.  Psychiatric:        Mood and Affect: Mood normal.        Behavior: Behavior normal.     ED Results / Procedures / Treatments   Labs (all labs ordered are listed, but only abnormal results are displayed) Labs Reviewed - No data to display  EKG None  Radiology No results found.  Procedures Procedures (including critical care time)  Medications Ordered in ED Medications  traMADol (ULTRAM) tablet 50 mg (has no administration in time range)  acetaminophen (TYLENOL) tablet 650 mg (has no administration in time range)    ED Course  I have reviewed the triage vital signs and the nursing notes.  Pertinent labs &  imaging results that were available during my  care of the patient were reviewed by me and considered in my medical decision making (see chart for details).    MDM Rules/Calculators/A&P                      Shantrelle Deluke is a 78 y.o. female here with fall, back pain, scapula pain. Likely MSK pain vs rib or scapula fracture. No syncope and no head injury. Will get xrays and give pain meds.   11:14 AM xrays showed no fracture. Pain improved. Will dc home with tramadol prn.   Final Clinical Impression(s) / ED Diagnoses Final diagnoses:  None    Rx / DC Orders ED Discharge Orders    None       Drenda Freeze, MD 08/03/19 1116

## 2019-08-03 NOTE — ED Notes (Signed)
Daughter, Stanford Breed would like a call when her mom's test results are back, [phone].

## 2019-08-03 NOTE — ED Triage Notes (Signed)
Pt arrives [facility] from home with complaints of a mechanical fall this am. Pt reports she slipped on her steps falling and injuring low back. Pt denies head injury. Pt arrives in C-collar from EMS.

## 2019-08-03 NOTE — Discharge Instructions (Signed)
Your xrays showed no fractures   You likely have strained your back after the fall   See your doctor  Return to ER if you have worse back pain, chest pain, headaches, vomiting

## 2019-08-08 ENCOUNTER — Other Ambulatory Visit: Payer: Self-pay | Admitting: Internal Medicine

## 2019-08-08 ENCOUNTER — Other Ambulatory Visit: Payer: Self-pay | Admitting: Critical Care Medicine

## 2019-08-08 DIAGNOSIS — B370 Candidal stomatitis: Secondary | ICD-10-CM

## 2019-08-08 DIAGNOSIS — J449 Chronic obstructive pulmonary disease, unspecified: Secondary | ICD-10-CM

## 2019-08-13 ENCOUNTER — Emergency Department (HOSPITAL_COMMUNITY)
Admission: EM | Admit: 2019-08-13 | Discharge: 2019-08-13 | Disposition: A | Discharge status: Home / Self Care | Payer: Medicare HMO | Attending: Emergency Medicine | Admitting: Emergency Medicine

## 2019-08-13 ENCOUNTER — Emergency Department (HOSPITAL_COMMUNITY): Payer: Medicare HMO

## 2019-08-13 ENCOUNTER — Encounter (HOSPITAL_COMMUNITY): Payer: Self-pay | Admitting: Emergency Medicine

## 2019-08-13 DIAGNOSIS — Y92009 Unspecified place in unspecified non-institutional (private) residence as the place of occurrence of the external cause: Secondary | ICD-10-CM | POA: Diagnosis not present

## 2019-08-13 DIAGNOSIS — Z7982 Long term (current) use of aspirin: Secondary | ICD-10-CM | POA: Insufficient documentation

## 2019-08-13 DIAGNOSIS — W19XXXA Unspecified fall, initial encounter: Secondary | ICD-10-CM | POA: Insufficient documentation

## 2019-08-13 DIAGNOSIS — S72424A Nondisplaced fracture of lateral condyle of right femur, initial encounter for closed fracture: Secondary | ICD-10-CM | POA: Insufficient documentation

## 2019-08-13 DIAGNOSIS — J449 Chronic obstructive pulmonary disease, unspecified: Secondary | ICD-10-CM | POA: Diagnosis not present

## 2019-08-13 DIAGNOSIS — E785 Hyperlipidemia, unspecified: Secondary | ICD-10-CM | POA: Insufficient documentation

## 2019-08-13 DIAGNOSIS — Z85038 Personal history of other malignant neoplasm of large intestine: Secondary | ICD-10-CM | POA: Insufficient documentation

## 2019-08-13 DIAGNOSIS — Z794 Long term (current) use of insulin: Secondary | ICD-10-CM | POA: Insufficient documentation

## 2019-08-13 DIAGNOSIS — I4891 Unspecified atrial fibrillation: Secondary | ICD-10-CM | POA: Diagnosis not present

## 2019-08-13 DIAGNOSIS — Y999 Unspecified external cause status: Secondary | ICD-10-CM | POA: Insufficient documentation

## 2019-08-13 DIAGNOSIS — Y939 Activity, unspecified: Secondary | ICD-10-CM | POA: Diagnosis not present

## 2019-08-13 DIAGNOSIS — I10 Essential (primary) hypertension: Secondary | ICD-10-CM | POA: Insufficient documentation

## 2019-08-13 DIAGNOSIS — Z79899 Other long term (current) drug therapy: Secondary | ICD-10-CM | POA: Insufficient documentation

## 2019-08-13 DIAGNOSIS — E119 Type 2 diabetes mellitus without complications: Secondary | ICD-10-CM | POA: Diagnosis not present

## 2019-08-13 DIAGNOSIS — Z87891 Personal history of nicotine dependence: Secondary | ICD-10-CM | POA: Insufficient documentation

## 2019-08-13 DIAGNOSIS — S8991XA Unspecified injury of right lower leg, initial encounter: Secondary | ICD-10-CM | POA: Diagnosis present

## 2019-08-13 MED ORDER — HYDROCODONE-ACETAMINOPHEN 5-325 MG PO TABS
1.0000 | ORAL_TABLET | Freq: Once | ORAL | Status: AC
  Administered 2019-08-13: 12:00:00 1 via ORAL
  Filled 2019-08-13: qty 1

## 2019-08-13 MED ORDER — HYDROCODONE-ACETAMINOPHEN 5-325 MG PO TABS: 1.0000 | ORAL_TABLET | Freq: Four times a day (QID) | ORAL | 0 refills | Status: AC | PRN

## 2019-08-13 NOTE — Discharge Instructions (Signed)
Thank you for allowing me to care for you today. Please return to the emergency department if you have new or worsening symptoms. Take your medications as instructed.  ° °

## 2019-08-13 NOTE — ED Notes (Signed)
Discharge paperwork reviewed with patient's daughter Lattie Haw. [facility] called to take patient to Lisa's house.

## 2019-08-13 NOTE — ED Notes (Signed)
ED Provider at bedside. 

## 2019-08-13 NOTE — ED Notes (Signed)
Patient assisted restroom. Ambulatory with walker and standby assistance.

## 2019-08-13 NOTE — ED Notes (Signed)
Patient's daughter made aware patient is on the way.

## 2019-08-13 NOTE — ED Notes (Signed)
Daughter, Stanford Breed would like an update on her mom and would like a phone call before she is discharged.

## 2019-08-13 NOTE — ED Notes (Signed)
Patient given sandwich and coffee.  

## 2019-08-13 NOTE — Progress Notes (Signed)
Pt has a broken femur, family desires SNF placement but is unsure if ortho is going to recommend SNF or to keep pt in a non weight-bearing status. CSW consulted with the Advanced Care Supervisor who went over options with CSW 1. SNF (unsure if pt is weight-bearing until the ortho appt), or HH with PT if ortho recommends or custodial (if ortho recommends) and spoek with pt's daughter.  Pt's sister stated the best option until the pt's ortho appointment and their recommendation is to return home due to concerns about COVID, get the pt to her ortho appt and then follow ortho's recommendations regarding home or SNF and pt's daughter .  Pt will go to pt's other daughter's house via [place] at:   [place], [place] [address]  Per pt's daughter both daughter's will be there to await the return of the pt.  CSW will continue to follow for D/C needs.  Amanda Guild. [name], LCSW, LCAS, CSI Transitions of Care Clinical Social Worker Care Coordination Department Ph: [phone]

## 2019-08-13 NOTE — Progress Notes (Signed)
ED CSW is aware of consult and pending PT recommendation.  CSW will continue to follow for D/C needs.  Alphonse Guild. Laverna Dossett, LCSW, LCAS, CSI Transitions of Care Clinical Social Worker Care Coordination Department Ph: [phone]

## 2019-08-13 NOTE — TOC Initial Note (Signed)
Transition of Care [facility]) - Initial/Assessment Note    Patient Details  Name: Amanda [name] MRN: [id number] Date of Birth: April 27, 1941  [facility] CM/SW Contact:    Claudine Mouton, LCSW Phone Number: 08/13/2019, 5:20 PM  Clinical Narrative:      Pt has a broken femur, family desires SNF placement but is unsure if ortho is going to recommend SNF or to keep pt in a non weight-bearing status. CSW consulted with the Advanced Care Supervisor who went over options with CSW 1. SNF (unsure if pt is weight-bearing until the ortho appt), or HH with PT if ortho recommends or custodial (if ortho recommends) and spoek with pt's daughter.    Pt's sister stated the best option until the pt's ortho appointment and their recommendation is to return home due to concerns about COVID, get the pt to her ortho appt and then follow ortho's recommendations.  Pt will go to pt's other daughter's house via [place] at:   [place], [place] [address]  Per pt's daughter both daughter's will be there to await the return of the pt.   Expected Discharge Plan: Skilled Nursing Facility Barriers to Discharge: Insurance Authorization   Patient Goals and CMS Choice Patient states their goals for this hospitalization and ongoing recovery are:: To get better and go home.      Expected Discharge Plan and Services Expected Discharge Plan: [place] arrangements for the past 2 months: Single Family Home                                      Prior Living Arrangements/Services Living arrangements for the past 2 months: Single Family Home Lives with:: Self   Do you feel safe going back to the place where you live?: No      Need for Family Participation in Patient Care: Yes (Comment) Care giver support system in place?: Yes (comment)      Activities of Daily Living      Permission Sought/Granted Permission sought to share information with : Facility  Art therapist granted to share information with : Yes, Verbal Permission Granted              Emotional Assessment Appearance:: Appears stated age   Affect (typically observed): Accepting, Adaptable, Pleasant, Calm Orientation: : Oriented to Self, Oriented to Place      Admission diagnosis:  Fall Patient Active Problem List   Diagnosis Date Noted  . Abnormal CXR 09/18/2016  . RSV bronchiolitis 08/10/2016  . COPD exacerbation ([place]) 08/09/2016  . Community acquired pneumonia 08/09/2016  . Hyponatremia 08/09/2016  . Hyperkalemia 08/09/2016  . Hyperbilirubinemia 08/09/2016  . Esophageal stricture   . Hx of adenomatous colonic polyps 05/15/2015  . Chronic respiratory failure with hypoxia ([place]) 05/18/2014  . Sepsis, unspecified organism ([place]) 03/28/2014  . Atrial fibrillation with RVR ([place]) 03/28/2014  . Right colon cancer (Stage I) s/p partial colectomy 01/12/14 01/10/2014  . Stricture and stenosis of esophagus 01/09/2014  . COPD (chronic obstructive pulmonary disease) ([place]) 01/08/2014  . GERD (gastroesophageal reflux disease)   . Hyperlipidemia   . Type 2 diabetes mellitus with complication, with long-term current use of insulin ([place])   . Essential hypertension, benign    PCP:  Timoteo Gaul, FNP Pharmacy:   [facility]),  - [place]  [place]) [place] [address] Phone: [phone] Fax: [phone]     Social Determinants of Health (SDOH) Interventions    Readmission Risk Interventions No flowsheet data found.

## 2019-08-13 NOTE — Discharge Planning (Signed)
EDCM to follow for disposition needs, notes Home with home health vs [place] consult.  Awaiting PT evaluation recommendations.

## 2019-08-13 NOTE — ED Notes (Signed)
Spoke with patient's daughter Santiago Glad, per patient request.

## 2019-08-13 NOTE — ED Notes (Signed)
PT at bedside.

## 2019-08-13 NOTE — ED Notes (Signed)
Patient transported to X-ray 

## 2019-08-13 NOTE — ED Provider Notes (Signed)
[facility] DEPT Provider Note   CSN: [id number] Arrival date & time: 08/13/19  1102     History Chief Complaint  Patient presents with  . Leg Pain    Amanda [name] is a 79 y.o. female.  Patient is an 79 year old female past medical history of atrial fibrillation, COPD, type 2 diabetes presenting to the emergency department for right leg pain after fall.  Patient had an initial mechanical fall on 31 December.  She was seen in the emergency department and evaluated and had negative x-rays of the chest, C-spine and pelvis at that time.  Patient reports that since then she has not been able to ambulate.  She lives at home with her ex-husband and reports prior to the fall she was able to ambulate without any assistance at all.  Reports now she is not able to stand up or put any pressure on her right leg.  She cannot ambulate even with a walker.  Reports she has no help at home. Denies any new injury or trauma        Past Medical History:  Diagnosis Date  . Arrhythmia    7-23-15x1beginning of colon surgery 01-12-14 - no problems since.  . Blood transfusion without reported diagnosis   . Cancer ([place])    colon cancer 01-12-14- no further tx. intended  . Candida esophagitis ([place]) 01/09/2014  . Cataract   . Closed fracture of unspecified part of upper end of humerus   . Complete rupture of rotator cuff    right shoulder- limited range of motion  . COPD (chronic obstructive pulmonary disease) ([place])   . Emphysema lung ([place])   . Emphysema of lung ([place])   . Esophageal stricture 12/28/2013  . Essential hypertension, benign    off lisinporil for last 2 months  . GERD (gastroesophageal reflux disease)   . HCAP (healthcare-associated pneumonia) 03/28/2014  . Hx of adenomatous colonic polyps 05/15/2015  . Hyperlipidemia   . Neurogenic bladder, NOS    02-22-14 some bladder issues of urgency is somewhat improve.  . Other vitamin B12 deficiency anemia   . Type II or  unspecified type diabetes mellitus without mention of complication, uncontrolled    diet controlled  . Vitamin D deficiency     Patient Active Problem List   Diagnosis Date Noted  . Abnormal CXR 09/18/2016  . RSV bronchiolitis 08/10/2016  . COPD exacerbation ([place]) 08/09/2016  . Community acquired pneumonia 08/09/2016  . Hyponatremia 08/09/2016  . Hyperkalemia 08/09/2016  . Hyperbilirubinemia 08/09/2016  . Esophageal stricture   . Hx of adenomatous colonic polyps 05/15/2015  . Chronic respiratory failure with hypoxia ([place]) 05/18/2014  . Sepsis, unspecified organism ([place]) 03/28/2014  . Atrial fibrillation with RVR ([place]) 03/28/2014  . Right colon cancer (Stage I) s/p partial colectomy 01/12/14 01/10/2014  . Stricture and stenosis of esophagus 01/09/2014  . COPD (chronic obstructive pulmonary disease) ([place]) 01/08/2014  . GERD (gastroesophageal reflux disease)   . Hyperlipidemia   . Type 2 diabetes mellitus with complication, with long-term current use of insulin ([place])   . Essential hypertension, benign     Past Surgical History:  Procedure Laterality Date  . BALLOON DILATION N/A 03/02/2014   Procedure: BALLOON DILATION;  Surgeon: Gatha Mayer, MD;  Location: [facility];  Service: Endoscopy;  Laterality: N/A;  . CATARACT EXTRACTION, BILATERAL Bilateral   . COLON SURGERY    . COLONOSCOPY N/A 01/10/2014   Procedure: COLONOSCOPY;  Surgeon: [name], MD;  Location: [facility];  Service: Endoscopy;  Laterality: N/A;  . DILATION AND CURETTAGE OF UTERUS    . ESOPHAGOGASTRODUODENOSCOPY N/A 01/16/2014   Procedure: ESOPHAGOGASTRODUODENOSCOPY (EGD);  Surgeon: Gatha Mayer, MD;  Location: [facility];  Service: Endoscopy;  Laterality: N/A;  . ESOPHAGOGASTRODUODENOSCOPY N/A 03/02/2014   Procedure: ESOPHAGOGASTRODUODENOSCOPY (EGD);  Surgeon: Gatha Mayer, MD;  Location: [facility];  Service: Endoscopy;  Laterality: N/A;  . ESOPHAGOGASTRODUODENOSCOPY N/A 08/21/2015   Procedure:  ESOPHAGOGASTRODUODENOSCOPY (EGD);  Surgeon: Gatha Mayer, MD;  Location: [facility];  Service: Endoscopy;  Laterality: N/A;  . ESOPHAGOGASTRODUODENOSCOPY (EGD) WITH PROPOFOL N/A 01/09/2014   Procedure: ESOPHAGOGASTRODUODENOSCOPY (EGD) WITH PROPOFOL;  Surgeon: [name], MD;  Location: [facility];  Service: Endoscopy;  Laterality: N/A;  . ESOPHAGOGASTRODUODENOSCOPY (EGD) WITH PROPOFOL N/A 01/19/2014   Procedure: ESOPHAGOGASTRODUODENOSCOPY (EGD) WITH PROPOFOL;  Surgeon: Gatha Mayer, MD;  Location: [facility];  Service: Endoscopy;  Laterality: N/A;  . EYE SURGERY Bilateral 2010   both eyes lens replacments  . LAPAROSCOPIC PARTIAL COLECTOMY N/A 01/12/2014   Procedure: LAPAROSCOPIC ASSISTED PARTIAL COLECTOMY AND REMOVAL OF RECTAL POLYP;  Surgeon: Odis Hollingshead, MD;  Location: [facility];  Service: General;  Laterality: N/A;  . ORIF SHOULDER FRACTURE Right 2011   arthroplasty  . SAVORY DILATION N/A 08/21/2015   Procedure: SAVORY DILATION;  Surgeon: Gatha Mayer, MD;  Location: [facility];  Service: Endoscopy;  Laterality: N/A;  . TONSILLECTOMY AND ADENOIDECTOMY  age 24 or 3     OB History   No obstetric history on file.     Family History  Problem Relation Age of Onset  . Diabetes Father   . Bone cancer Father        bone marrow  . Emphysema Mother   . Pneumonia Mother   . Colon cancer Neg Hx   . Esophageal cancer Neg Hx   . Stomach cancer Neg Hx   . Rectal cancer Neg Hx     Social History   Tobacco Use  . Smoking status: Former Smoker    Packs/day: 1.00    Years: 20.00    Pack years: 20.00    Types: Cigarettes    Quit date: 08/03/2005    Years since quitting: 14.0  . Smokeless tobacco: Never Used  Substance Use Topics  . Alcohol use: No    Alcohol/week: 0.0 standard drinks  . Drug use: No    Home Medications Prior to Admission medications   Medication Sig Start Date End Date Taking? Authorizing Provider  albuterol (VENTOLIN HFA) 108 (90 Base) MCG/ACT  inhaler Inhale 2 puffs into the lungs every 6 (six) hours as needed for wheezing or shortness of breath. 06/09/19  Yes Julian Hy, DO  aspirin EC 81 MG EC tablet Take 1 tablet (81 mg total) by mouth daily. 11/09/14  Yes Belva Crome, MD  ferrous sulfate 325 (65 FE) MG tablet Take 1 tablet (325 mg total) by mouth daily. 01/01/14  Yes Drenda Freeze, MD  Fluticasone-Umeclidin-Vilant (TRELEGY ELLIPTA) 100-62.5-25 MCG/INH AEPB Inhale 1 puff into the lungs daily. 06/09/19  Yes Noemi Chapel P, DO  hydrochlorothiazide (MICROZIDE) 12.5 MG capsule Take 12.5 mg by mouth every morning. 06/02/19  Yes [provider]  insulin NPH-regular Human (NOVOLIN 70/30) (70-30) 100 UNIT/ML injection Inject 6 Units into the skin daily with breakfast.   Yes [provider]  nystatin (MYCOSTATIN) 100000 UNIT/ML suspension Take 5 mLs (500,000 Units total) by mouth 2 (two) times daily. 06/09/19  Yes Julian Hy, DO  omeprazole (PRILOSEC) 40 MG capsule TAKE 1 CAPSULE BY MOUTH TWICE DAILY 30 MINUTES BEFORE BREAKFAST AND  SUPPER 08/08/19  Yes Gatha Mayer, MD  oxybutynin (DITROPAN) 5 MG tablet Take 5 mg by mouth 2 (two) times daily. 01/23/16  Yes [provider]  Vitamin D, Ergocalciferol, (DRISDOL) 50000 units CAPS capsule Take 50,000 Units by mouth every 7 (seven) days. On fridays   Yes [provider]  HYDROcodone-acetaminophen (NORCO/VICODIN) 5-325 MG tablet Take 1 tablet by mouth every 6 (six) hours as needed for up to 3 days for severe pain. 08/13/19 08/16/19  Alveria Apley, PA-C  traMADol (ULTRAM) 50 MG tablet Take 1 tablet (50 mg total) by mouth every 6 (six) hours as needed. Patient not taking: Reported on 08/13/2019 08/03/19   Drenda Freeze, MD    Allergies    Patient has no known allergies.  Review of Systems   Review of Systems  Constitutional: Negative for chills and fever.  Respiratory: Negative for cough and shortness of breath.   Cardiovascular: Negative for chest  pain.  Gastrointestinal: Negative for nausea and vomiting.  Genitourinary: Negative for dysuria.  Musculoskeletal: Positive for arthralgias and gait problem. Negative for back pain, joint swelling, myalgias and neck pain.  Skin: Positive for color change (ecchymosis).  Neurological: Negative for dizziness, syncope, light-headedness, numbness and headaches.    Physical Exam Updated Vital Signs BP (!) 175/81   Pulse (!) 107   Temp 97.8 F (36.6 C) (Oral)   Resp 15   SpO2 94%   Physical Exam Vitals and nursing note reviewed.  Constitutional:      Appearance: Normal appearance.  HENT:     Head: Normocephalic.  Eyes:     Conjunctiva/sclera: Conjunctivae normal.  Pulmonary:     Effort: Pulmonary effort is normal.  Musculoskeletal:     Right knee: Ecchymosis present. No swelling, deformity, effusion, erythema or lacerations. Decreased range of motion. Tenderness present over the lateral joint line.     Right lower leg: Tenderness (TTP over the tibia) present.     Right ankle: Ecchymosis present. No swelling or deformity. Tenderness present over the lateral malleolus and proximal fibula. No base of 5th metatarsal tenderness. Decreased range of motion.     Comments: Normal distal pulses and sensations  Skin:    General: Skin is warm and dry.     Capillary Refill: Capillary refill takes less than 2 seconds.     Findings: Bruising present.     Comments: Extensive ecchymosis of the R lateral hip and RLE from the knee down.  Neurological:     Mental Status: She is alert.  Psychiatric:        Mood and Affect: Mood normal.     ED Results / Procedures / Treatments   Labs (all labs ordered are listed, but only abnormal results are displayed) Labs Reviewed - No data to display  EKG None  Radiology DG Ankle Complete Right  Result Date: 08/13/2019 CLINICAL DATA:  Fall, right ankle pain EXAM: RIGHT ANKLE - COMPLETE 3+ VIEW COMPARISON:  None. FINDINGS: No fracture or subluxation. No  suspicious focal osseous lesions. No significant arthropathy. Tiny plantar right calcaneal spur. No radiopaque foreign bodies. IMPRESSION: No right ankle fracture or subluxation. Electronically Signed   By: Ilona Sorrel M.D.   On: 08/13/2019 11:54   DG Knee Complete 4 Views Right  Result Date: 08/13/2019 CLINICAL DATA:  Fall with right knee pain EXAM: RIGHT KNEE - COMPLETE 4+ VIEW COMPARISON:  None.  FINDINGS: Nondisplaced intra-articular fracture of lateral condyle of the distal right femur with moderate suprapatellar lipohemarthrosis. No dislocation. No additional fracture. No suspicious focal osseous lesions. No significant degenerative arthropathy. No radiopaque foreign body. IMPRESSION: Nondisplaced intra-articular lateral condyle distal right femur fracture with moderate suprapatellar lipohemarthrosis. Electronically Signed   By: Ilona Sorrel M.D.   On: 08/13/2019 11:53    Procedures Procedures (including critical care time)  Medications Ordered in ED Medications  HYDROcodone-acetaminophen (NORCO/VICODIN) 5-325 MG per tablet 1 tablet (1 tablet Oral Given 08/13/19 1156)    ED Course  I have reviewed the triage vital signs and the nursing notes.  Pertinent labs & imaging results that were available during my care of the patient were reviewed by me and considered in my medical decision making (see chart for details).  Clinical Course as of Aug 14 833  Sun Aug 13, 2019  1222 Patient presenting with inability to ambulate after fall 12/31. Xray shows Nondisplaced intra-articular lateral condyle distal right femur fracture with moderate suprapatellar lipohemarthrosis.     [KM]  1302 I spoke with Dr. Doran Durand from ortho who states patient does not need to be admitted and admitting her would be "inappropriate" for the type of injury she has. He stated patient needs knee immobilizer and non weight bearing until f/u in office. Patient is concerned with lack of help at home. Will consult with CM/SW  for help at home.  SW/CM unable to place patient tonight. Daughter able to pick her up and take her home tonight and f/u ortho    [KM]    Clinical Course User Index [KM] Kristine Royal   MDM Rules/Calculators/A&P                     Based on review of vitals, medical screening exam, lab work and/or imaging, there does not appear to be an acute, emergent etiology for the patient's symptoms. Counseled pt on good return precautions and encouraged both PCP and ED follow-up as needed.  Prior to discharge, I also discussed incidental imaging findings with patient in detail and advised appropriate, recommended follow-up in detail.  Clinical Impression: 1. Closed nondisplaced fracture of lateral condyle of right femur, initial encounter ([place])     Disposition: Discharge  Prior to providing a prescription for a controlled substance, I independently reviewed the patient's recent prescription history on the [place]. The patient had no recent or regular prescriptions and was deemed appropriate for a brief, less than 3 day prescription of narcotic for acute analgesia.  This note was prepared with assistance of Systems analyst. Occasional wrong-word or sound-a-like substitutions may have occurred due to the inherent limitations of voice recognition software.  Final Clinical Impression(s) / ED Diagnoses Final diagnoses:  Closed nondisplaced fracture of lateral condyle of right femur, initial encounter [facility])    Rx / DC Orders ED Discharge Orders         Ordered    HYDROcodone-acetaminophen (NORCO/VICODIN) 5-325 MG tablet  Every 6 hours PRN     08/13/19 1606           Lacretia Leigh, MD 08/15/19 1333

## 2019-08-13 NOTE — Evaluation (Signed)
Physical Therapy Evaluation Patient Details Name: Amanda [name] MRN: [id number] DOB: 02/22/1941 Today's Date: 08/13/2019   History of Present Illness  Pt admitted with inability to ambulate 2* min WB tolerance R LE since fall Dec 31/20.  Imaging today showing Non-displaced Intra-articular fx of lateral condyle of distal R femur with moderate suprapattelar lipohemartrosis.  Pt with hx of DM, COPD, A-fib and R rotator cuff rupture  Clinical Impression  Pt admitted as above and presenting with functional mobility limitations 2 inability to WB on R LE 2* pain and associated affect on balance and mobility.  Pt would benefit from follow up rehab at SNF level unless family can manage initial 24/7 assist in which case HHPT follow up would be appropriate.  Pt states she has a 3n1 at home but would also need a YOUTH LEVEL RW if dc to home.  Please also clarify WB status (tdwb/nwb assumed until otherwise advised).    Follow Up Recommendations SNF    Equipment Recommendations  Rolling walker with 5" wheels(Youth level RW please - pt is 5'1")    Recommendations for Other Services OT consult     Precautions / Restrictions Precautions Precautions: Fall Restrictions Other Position/Activity Restrictions: No WB orders placed - assumed TDWB/NWB and requested clarification.      Mobility  Bed Mobility Overal bed mobility: Needs Assistance Bed Mobility: Supine to Sit;Sit to Supine     Supine to sit: Min assist;Mod assist Sit to supine: Min assist;Mod assist   General bed mobility comments: assist to manage R LE and to control trunk  Transfers Overall transfer level: Needs assistance Equipment used: Rolling walker (2 wheeled) Transfers: Sit to/from Stand Sit to Stand: Min assist;Mod assist;From elevated surface         General transfer comment: cues for LE management and use of UEs to self assist.  Physical assist to bring wt up and fwd and to balance in initial  standing  Ambulation/Gait Ambulation/Gait assistance: Min assist Gait Distance (Feet): 11 Feet Assistive device: Rolling walker (2 wheeled) Gait Pattern/deviations: Step-to pattern;Decreased step length - right;Decreased step length - left;Shuffle;Trunk flexed Gait velocity: decr   General Gait Details: Increased time with cues for TDWB, sequence, posture and position from [employer]            Wheelchair Mobility    Modified Rankin (Stroke Patients Only)       Balance Overall balance assessment: Needs assistance Sitting-balance support: No upper extremity supported;Feet supported Sitting balance-Leahy Scale: Good     Standing balance support: Bilateral upper extremity supported Standing balance-Leahy Scale: Poor                               Pertinent Vitals/Pain Pain Assessment: Faces Faces Pain Scale: Hurts a little bit Pain Location: R knee Pain Descriptors / Indicators: Guarding Pain Intervention(s): Limited activity within patient's tolerance;Monitored during session    Home Living Family/patient expects to be discharged to:: Unsure                 Additional Comments: Pt does not have 24/7 assist at home currently.  Has several steps to enter home and does have rollator and 3n1    Prior Function Level of Independence: Needs assistance   Gait / Transfers Assistance Needed: very ltd with rollator  ADL's / Homemaking Assistance Needed: assist of family intermittently        Hand Dominance   Dominant Hand: Right  Extremity/Trunk Assessment   Upper Extremity Assessment Upper Extremity Assessment: Overall WFL for tasks assessed    Lower Extremity Assessment Lower Extremity Assessment: RLE deficits/detail RLE Deficits / Details: KI in place; noted dark bruising lower leg to ankle RLE: Unable to fully assess due to immobilization       Communication   Communication: No difficulties  Cognition Arousal/Alertness:  Awake/alert Behavior During Therapy: WFL for tasks assessed/performed Overall Cognitive Status: Within Functional Limits for tasks assessed                                        General Comments      Exercises     Assessment/Plan    PT Assessment Patient needs continued PT services  PT Problem List Decreased strength;Decreased range of motion;Decreased activity tolerance;Decreased balance;Decreased mobility;Decreased knowledge of use of DME;Pain       PT Treatment Interventions DME instruction;Gait training;Stair training;Functional mobility training;Therapeutic activities;Therapeutic exercise;Balance training;Patient/family education    PT Goals (Current goals can be found in the Care Plan section)  Acute Rehab PT Goals Patient Stated Goal: Regain IND PT Goal Formulation: With patient Time For Goal Achievement: 08/27/19 Potential to Achieve Goals: Good    Frequency Min 3X/week   Barriers to discharge Decreased caregiver support Pt does not have 24/7 assist - states her children assist but all work    Co-evaluation               AM-PAC PT "6 Clicks" Mobility  Outcome Measure Help needed turning from your back to your side while in a flat bed without using bedrails?: A Little Help needed moving from lying on your back to sitting on the side of a flat bed without using bedrails?: A Little Help needed moving to and from a bed to a chair (including a wheelchair)?: A Lot Help needed standing up from a chair using your arms (e.g., wheelchair or bedside chair)?: A Lot Help needed to walk in hospital room?: A Little Help needed climbing 3-5 steps with a railing? : A Lot 6 Click Score: 15    End of Session Equipment Utilized During Treatment: Gait belt;Right knee immobilizer Activity Tolerance: Patient limited by fatigue Patient left: in bed;with call bell/phone within reach Nurse Communication: Mobility status PT Visit Diagnosis: Difficulty in  walking, not elsewhere classified (R26.2);Pain Pain - Right/Left: Right Pain - part of body: Knee    Time: [id number] PT Time Calculation (min) (ACUTE ONLY): 22 min   Charges:   PT Evaluation $PT Eval Low Complexity: 1 Low          [place] Pager [phone] Office [phone]   Tennelle Taflinger 08/13/2019, 4:06 PM

## 2019-08-13 NOTE — ED Triage Notes (Signed)
Per [facility], patient from home, seen for fall on 12/31. Reports worsening bruising to right lower leg with decreased mobility. Family told [facility] they want patient placed for rehab.

## 2019-09-14 ENCOUNTER — Encounter (HOSPITAL_COMMUNITY): Payer: Self-pay | Admitting: Orthopedic Surgery

## 2019-09-14 ENCOUNTER — Inpatient Hospital Stay (HOSPITAL_COMMUNITY): Admission: RE | Admit: 2019-09-14 | Payer: Medicare HMO | Source: Ambulatory Visit

## 2019-09-14 DIAGNOSIS — M79604 Pain in right leg: Secondary | ICD-10-CM

## 2019-09-15 ENCOUNTER — Encounter (INDEPENDENT_AMBULATORY_CARE_PROVIDER_SITE_OTHER): Payer: Self-pay

## 2019-09-15 ENCOUNTER — Other Ambulatory Visit (HOSPITAL_COMMUNITY): Payer: Self-pay | Admitting: Orthopedic Surgery

## 2019-09-15 ENCOUNTER — Other Ambulatory Visit: Payer: Self-pay

## 2019-09-15 ENCOUNTER — Ambulatory Visit (HOSPITAL_COMMUNITY)
Admission: RE | Admit: 2019-09-15 | Discharge: 2019-09-15 | Disposition: A | Discharge status: Home / Self Care | Payer: Medicare HMO | Source: Ambulatory Visit | Attending: Cardiology | Admitting: Cardiology

## 2019-09-15 DIAGNOSIS — M79661 Pain in right lower leg: Secondary | ICD-10-CM | POA: Insufficient documentation

## 2019-09-15 DIAGNOSIS — M7989 Other specified soft tissue disorders: Secondary | ICD-10-CM | POA: Insufficient documentation

## 2019-09-22 ENCOUNTER — Ambulatory Visit: Payer: Medicare HMO

## 2019-09-24 ENCOUNTER — Ambulatory Visit: Payer: Medicare HMO | Attending: Internal Medicine

## 2019-09-24 DIAGNOSIS — Z23 Encounter for immunization: Secondary | ICD-10-CM | POA: Insufficient documentation

## 2019-09-24 NOTE — Progress Notes (Signed)
   Covid-19 Vaccination Clinic  Name:  Amanda [name]    MRN: [id number] DOB: [birth year]  09/24/2019  [name] was observed post Covid-19 immunization for 15 minutes without incidence. She was provided with Vaccine Information Sheet and instruction to access the V-Safe system.   [name] was instructed to call 911 with any severe reactions post vaccine: Marland Kitchen Difficulty breathing  . Swelling of your face and throat  . A fast heartbeat  . A bad rash all over your body  . Dizziness and weakness    Immunizations Administered    Name Date Dose VIS Date Route   Pfizer COVID-19 Vaccine 09/24/2019  2:05 PM 0.3 mL 07/14/2019 Intramuscular   Manufacturer: [place]   Lot: [id number]   [place]: [id number]

## 2019-10-16 ENCOUNTER — Other Ambulatory Visit: Payer: Self-pay | Admitting: Internal Medicine

## 2019-10-18 ENCOUNTER — Ambulatory Visit: Payer: Medicare HMO | Attending: Internal Medicine

## 2019-10-18 DIAGNOSIS — Z23 Encounter for immunization: Secondary | ICD-10-CM

## 2019-10-18 NOTE — Progress Notes (Signed)
   Covid-19 Vaccination Clinic  Name:  Amanda [name]    MRN: [id number] DOB: Jun 11, 1941  10/18/2019  [name] was observed post Covid-19 immunization for 15 minutes without incident. She was provided with Vaccine Information Sheet and instruction to access the V-Safe system.   [name] was instructed to call 911 with any severe reactions post vaccine: Marland Kitchen Difficulty breathing  . Swelling of face and throat  . A fast heartbeat  . A bad rash all over body  . Dizziness and weakness   Immunizations Administered    Name Date Dose VIS Date Route   Pfizer COVID-19 Vaccine 10/18/2019  4:36 PM 0.3 mL 07/14/2019 Intramuscular   Manufacturer: [place]   Lot: [id number]   [place]: [id number]

## 2020-02-06 ENCOUNTER — Other Ambulatory Visit: Payer: Self-pay | Admitting: Internal Medicine

## 2020-04-30 ENCOUNTER — Other Ambulatory Visit: Payer: Self-pay | Admitting: Internal Medicine

## 2020-06-13 ENCOUNTER — Other Ambulatory Visit: Payer: Self-pay | Admitting: Critical Care Medicine

## 2020-06-13 DIAGNOSIS — J449 Chronic obstructive pulmonary disease, unspecified: Secondary | ICD-10-CM

## 2020-06-17 ENCOUNTER — Other Ambulatory Visit: Payer: Self-pay | Admitting: Critical Care Medicine

## 2020-06-17 DIAGNOSIS — J449 Chronic obstructive pulmonary disease, unspecified: Secondary | ICD-10-CM

## 2020-06-17 NOTE — Telephone Encounter (Signed)
There was a request for refill of Trelegy but has not be seen in the office for over a year. I tried to call patient to schedule an appt. There was no answer, left VM. Patient is a patient of Dr. Carlis Abbott so will need new provider.

## 2020-06-20 ENCOUNTER — Telehealth: Payer: Self-pay | Admitting: Critical Care Medicine

## 2020-06-20 NOTE — Telephone Encounter (Signed)
Looks like patient is scheduled with Derl Barrow for tomorrow 06/21/20

## 2020-06-20 NOTE — Telephone Encounter (Signed)
Request for refill from [facility] received.  Patient has not been seen since 06/09/2019.  Prescription sent back with 1 month supply, no refills.  Patient will have to be seen in the office prior to additional prescriptions being sent.  Please call the patient to schedule a visit.  Julian Hy, DO 06/20/20 1:23 PM [facility]

## 2020-06-21 ENCOUNTER — Other Ambulatory Visit: Payer: Self-pay

## 2020-06-21 ENCOUNTER — Encounter: Payer: Self-pay | Admitting: Primary Care

## 2020-06-21 ENCOUNTER — Ambulatory Visit: Payer: Medicare HMO | Admitting: Primary Care

## 2020-06-21 DIAGNOSIS — J449 Chronic obstructive pulmonary disease, unspecified: Secondary | ICD-10-CM | POA: Diagnosis not present

## 2020-06-21 DIAGNOSIS — J432 Centrilobular emphysema: Secondary | ICD-10-CM | POA: Diagnosis not present

## 2020-06-21 DIAGNOSIS — B370 Candidal stomatitis: Secondary | ICD-10-CM

## 2020-06-21 MED ORDER — TRELEGY ELLIPTA 100-62.5-25 MCG/INH IN AEPB: INHALATION_SPRAY | RESPIRATORY_TRACT | 11 refills | Status: AC

## 2020-06-21 MED ORDER — NYSTATIN 100000 UNIT/ML MT SUSP: 5.0000 mL | Freq: Two times a day (BID) | OROMUCOSAL | 1 refills | Status: AC

## 2020-06-21 NOTE — Patient Instructions (Signed)
Recommendations: - Continue Trelegy 1 puff daily (rinse mouth after [place]) - Refill Nystatin, take as needed for thrush symptoms   Follow-up: - As needed basis with pulmonary *We refilled Trelegy for 1 year. See if PCP will take over prescription, if not you need to come back in 1 year with new pulmonary provider    Chronic Obstructive Pulmonary Disease Chronic obstructive pulmonary disease (COPD) is a long-term (chronic) lung problem. When you have COPD, it is hard for air to get in and out of your lungs. Usually the condition gets worse over time, and your lungs will never return to normal. There are things you can do to keep yourself as healthy as possible.  Your doctor may treat your condition with: ? Medicines. ? Oxygen. ? Lung surgery.  Your doctor may also recommend: ? Rehabilitation. This includes steps to make your body work better. It may involve a team of specialists. ? Quitting smoking, if you smoke. ? Exercise and changes to your diet. ? Comfort measures (palliative care). Follow these instructions at home: Medicines  Take over-the-counter and prescription medicines only as told by your doctor.  Talk to your doctor before taking any cough or allergy medicines. You may need to avoid medicines that cause your lungs to be dry. Lifestyle  If you smoke, stop. Smoking makes the problem worse. If you need help quitting, ask your doctor.  Avoid being around things that make your breathing worse. This may include smoke, chemicals, and fumes.  Stay active, but remember to rest as well.  Learn and use tips on how to relax.  Make sure you get enough sleep. Most adults need at least 7 hours of sleep every night.  Eat healthy foods. Eat smaller meals more often. Rest before meals. Controlled breathing Learn and use tips on how to control your breathing as told by your doctor. Try:  Breathing in (inhaling) through your nose for 1 second. Then, pucker your lips and breath out  (exhale) through your lips for 2 seconds.  Putting one hand on your belly (abdomen). Breathe in slowly through your nose for 1 second. Your hand on your belly should move out. Pucker your lips and breathe out slowly through your lips. Your hand on your belly should move in as you breathe out.  Controlled coughing Learn and use controlled coughing to clear mucus from your lungs. Follow these steps: 1. Lean your head a little forward. 2. Breathe in deeply. 3. Try to hold your breath for 3 seconds. 4. Keep your mouth slightly open while coughing 2 times. 5. Spit any mucus out into a tissue. 6. Rest and do the steps again 1 or 2 times as needed. General instructions  Make sure you get all the shots (vaccines) that your doctor recommends. Ask your doctor about a flu shot and a pneumonia shot.  Use oxygen therapy and pulmonary rehabilitation if told by your doctor. If you need home oxygen therapy, ask your doctor if you should buy a tool to measure your oxygen level (oximeter).  Make a COPD action plan with your doctor. This helps you to know what to do if you feel worse than usual.  Manage any other conditions you have as told by your doctor.  Avoid going outside when it is very hot, cold, or humid.  Avoid people who have a sickness you can catch (contagious).  Keep all follow-up visits as told by your doctor. This is important. Contact a doctor if:  You cough up more  mucus than usual.  There is a change in the color or thickness of the mucus.  It is harder to breathe than usual.  Your breathing is faster than usual.  You have trouble sleeping.  You need to use your medicines more often than usual.  You have trouble doing your normal activities such as getting dressed or walking around the house. Get help right away if:  You have shortness of breath while resting.  You have shortness of breath that stops you from: ? Being able to talk. ? Doing normal activities.  Your  chest hurts for longer than 5 minutes.  Your skin color is more blue than usual.  Your pulse oximeter shows that you have low oxygen for longer than 5 minutes.  You have a fever.  You feel too tired to breathe normally. Summary  Chronic obstructive pulmonary disease (COPD) is a long-term lung problem.  The way your lungs work will never return to normal. Usually the condition gets worse over time. There are things you can do to keep yourself as healthy as possible.  Take over-the-counter and prescription medicines only as told by your doctor.  If you smoke, stop. Smoking makes the problem worse. This information is not intended to replace advice given to you by your health care provider. Make sure you discuss any questions you have with your health care provider. Document Revised: 07/02/2017 Document Reviewed: 08/24/2016 Elsevier Patient Education  2020 [employer].

## 2020-06-21 NOTE — Progress Notes (Signed)
@Patient  ID: Amanda [name], female    DOB: [birth year], 79 y.o.   MRN: [id number]  Chief Complaint  Patient presents with  . Follow-up    Pt states she has been doing good since last visit and denies any complaints with her breathing.    Referring provider: Timoteo Gaul, FNP  HPI: 79 year old female, former smoker quit in 2007 (20-pack-year history).  Medical history significant for severe COPD.  Patient Dr. Carlis Abbott, last seen in office on 06/09/2019.  Previous [facility] pulmonary encounter: 06/08/20- Dr. Carlis Abbott  Amanda [name] is an 79 year old woman who presents for follow-up for COPD.  She has been doing well on Trelegy, which was started in March 2020.  She has had a few episodes of thrush recently, which have responded to nystatin oral solution.  They resolved for several weeks before coming back.  She is on 70/30 insulin, but never checks her blood sugar.  She reports that her most recent A1c was around 7.  Her insulin is managed by her primary care provider.  She is able to walk around as much as she wants; she thinks she could walk for an hour or 2.  She denies dyspnea on exertion, sputum production, wheezing, cough.  Her GERD is well controlled with omeprazole daily.  She has had esophageal strictures requiring multiple dilations in the past, by [facility]rel[facility].  She is up-to-date on her seasonal flu and pneumonia vaccines.  She is complaining of dizziness associated with some of her meds-oxybutynin, omeprazole, hydrochlorothiazide. This occurs after taking her morning meds, but is not associated to position changes.  06/21/2020- Interim hx Presents today for regular follow-up for COPD, needing refill of Trelegy Ellipta. No recent exacerbations. She is doing well, her breathing is baseline.  She find that she needs to use her rescue inhaler max once a day if exerting herself. She gets oral thrush symptoms every 3-4 months, uses nystatin as needed. Ambulates with rolling  walker.    No Known Allergies  Immunization History  Administered Date(s) Administered  . Influenza Split 05/09/2015  . Influenza, High Dose Seasonal PF 05/30/2013, 05/18/2016, 04/20/2019, 06/07/2020  . Influenza,inj,Quad PF,6+ Mos 04/04/2014  . Influenza-Unspecified 05/27/2017, 05/03/2018  . PFIZER SARS-COV-2 Vaccination 09/24/2019, 10/18/2019, 06/07/2020  . Pneumococcal Conjugate-13 04/04/2014  . Pneumococcal Polysaccharide-23 08/31/2011    Past Medical History:  Diagnosis Date  . Arrhythmia    7-23-15x1beginning of colon surgery 01-12-14 - no problems since.  . Blood transfusion without reported diagnosis   . Cancer ([place])    colon cancer 01-12-14- no further tx. intended  . Candida esophagitis ([place]) 01/09/2014  . Cataract   . Closed fracture of unspecified part of upper end of humerus   . Complete rupture of rotator cuff    right shoulder- limited range of motion  . COPD (chronic obstructive pulmonary disease) ([place])   . Emphysema lung ([place])   . Emphysema of lung ([place])   . Esophageal stricture 12/28/2013  . Essential hypertension, benign    off lisinporil for last 2 months  . GERD (gastroesophageal reflux disease)   . HCAP (healthcare-associated pneumonia) 03/28/2014  . Hx of adenomatous colonic polyps 05/15/2015  . Hyperlipidemia   . Neurogenic bladder, NOS    02-22-14 some bladder issues of urgency is somewhat improve.  . Other vitamin B12 deficiency anemia   . Type II or unspecified type diabetes mellitus without mention of complication, uncontrolled    diet controlled  . Vitamin D deficiency  Tobacco History: Social History   Tobacco Use  Smoking Status Former Smoker  . Packs/day: 1.00  . Years: 20.00  . Pack years: 20.00  . Types: Cigarettes  . Quit date: 08/03/2005  . Years since quitting: 14.9  Smokeless Tobacco Never Used   Counseling given: Not Answered   Outpatient Medications Prior to Visit  Medication Sig Dispense Refill  . albuterol (VENTOLIN  HFA) 108 (90 Base) MCG/ACT inhaler Inhale 2 puffs into the lungs every 6 (six) hours as needed for wheezing or shortness of breath. 6.7 g 11  . aspirin EC 81 MG EC tablet Take 1 tablet (81 mg total) by mouth daily.    . ferrous sulfate 325 (65 FE) MG tablet Take 1 tablet (325 mg total) by mouth daily. 30 tablet 0  . hydrochlorothiazide (MICROZIDE) 12.5 MG capsule Take 12.5 mg by mouth every morning.    . insulin NPH-regular Human (NOVOLIN 70/30) (70-30) 100 UNIT/ML injection Inject 6 Units into the skin daily with breakfast.    . nitrofurantoin (MACRODANTIN) 100 MG capsule Take 100 mg by mouth 2 (two) times daily.    Marland Kitchen omeprazole (PRILOSEC) 40 MG capsule TAKE 1 CAPSULE BY MOUTH TWICE DAILY 30 MINS BEFORE BREAKFAST  AND SUPPER 60 capsule 0  . oxybutynin (DITROPAN) 5 MG tablet Take 5 mg by mouth 2 (two) times daily.    . Vitamin D, Ergocalciferol, (DRISDOL) 50000 units CAPS capsule Take 50,000 Units by mouth every 7 (seven) days. On fridays    . TRELEGY ELLIPTA 100-62.5-25 MCG/INH AEPB INHALE 1 PUFF INTO THE LUNGS ONCE DAILY 60 each 0  . nystatin (MYCOSTATIN) 100000 UNIT/ML suspension Take 5 mLs (500,000 Units total) by mouth 2 (two) times daily. (Patient not taking: Reported on 06/21/2020) 120 mL 0  . traMADol (ULTRAM) 50 MG tablet Take 1 tablet (50 mg total) by mouth every 6 (six) hours as needed. (Patient not taking: Reported on 08/13/2019) 10 tablet 0   No facility-administered medications prior to visit.    Review of Systems  Review of Systems  Constitutional: Negative for chills, fatigue and fever.  HENT: Negative.   Respiratory: Negative for cough, chest tightness, shortness of breath and wheezing.   Cardiovascular: Negative.    Physical Exam  BP (!) 150/80 (BP Location: Left Arm, Cuff Size: Normal)   Pulse 83   Ht 5\' 1"  (1.549 m)   Wt 110 lb 12.8 oz (50.3 kg)   SpO2 98%   BMI 20.94 kg/m  Physical Exam Constitutional:      General: She is not in acute distress.    Appearance:  Normal appearance. She is not ill-appearing.  HENT:     Head: Normocephalic and atraumatic.     Mouth/Throat:     Comments: Deferred d.t masking Cardiovascular:     Rate and Rhythm: Normal rate.  Pulmonary:     Effort: Pulmonary effort is normal.     Breath sounds: Normal breath sounds.     Comments: CTA Musculoskeletal:     Cervical back: Normal range of motion and neck supple.     Comments: Amb with rolling walker  Skin:    General: Skin is warm and dry.  Neurological:     General: No focal deficit present.     Mental Status: She is alert and oriented to person, place, and time. Mental status is at baseline.  Psychiatric:        Mood and Affect: Mood normal.        Behavior: Behavior normal.  Thought Content: Thought content normal.        Judgment: Judgment normal.      Lab Results:  CBC    Component Value Date/Time   WBC 6.7 08/10/2016 0803   RBC 3.80 (L) 08/10/2016 0803   HGB 10.8 (L) 08/10/2016 0803   HGB 13.4 02/15/2015 1133   HCT 32.8 (L) 08/10/2016 0803   HCT 40.8 02/15/2015 1133   PLT 244 08/10/2016 0803   PLT 333 02/15/2015 1133   MCV 86.3 08/10/2016 0803   MCV 89.7 02/15/2015 1133   MCH 28.4 08/10/2016 0803   MCHC 32.9 08/10/2016 0803   RDW 12.7 08/10/2016 0803   RDW 13.2 02/15/2015 1133   LYMPHSABS 0.9 08/09/2016 1744   LYMPHSABS 1.6 02/15/2015 1133   MONOABS 0.7 08/09/2016 1744   MONOABS 0.6 02/15/2015 1133   EOSABS 0.2 08/09/2016 1744   EOSABS 0.3 02/15/2015 1133   BASOSABS 0.1 08/09/2016 1744   BASOSABS 0.1 02/15/2015 1133    BMET    Component Value Date/Time   NA 134 (L) 08/10/2016 0803   NA 136 02/15/2015 1133   K 4.6 08/10/2016 0803   K 5.4 (H) 02/15/2015 1133   CL 103 08/10/2016 0803   CO2 24 08/10/2016 0803   CO2 31 (H) 02/15/2015 1133   GLUCOSE 237 (H) 08/10/2016 0803   GLUCOSE 129 02/15/2015 1133   BUN 21 (H) 08/10/2016 0803   BUN 13.7 02/15/2015 1133   CREATININE 1.19 (H) 08/10/2016 0803   CREATININE 1.0 02/15/2015  1133   CALCIUM 8.9 08/10/2016 0803   CALCIUM 10.6 (H) 02/15/2015 1133   GFRNONAA 43 (L) 08/10/2016 0803   GFRAA 50 (L) 08/10/2016 0803    BNP No results found for: BNP  ProBNP    Component Value Date/Time   PROBNP 566.3 (H) 03/28/2014 2006    Imaging: No results found.   Assessment & Plan:   COPD (chronic obstructive pulmonary disease) ([place]) - Well controlled on current regimen. No recent exacerbations or acute complaints. Uses SABA once daily max. - Continue Trelegy Elipta 100 one puff daily; PRN Albuterol 2 puffs q 4-6 hours - Can follow-up with Pulmonary as needed basis, PCP can manage Trelegy prescription if she continues doing well  Thrush, oral - Patient gets thrush symptoms 2-3 times a year, if continues or worsens may need to consider removing ICS - Rx Nystain S&S 79ml 2-4 times a day PRN oral thrush     Martyn Ehrich, NP 07/01/2020

## 2020-07-01 ENCOUNTER — Encounter: Payer: Self-pay | Admitting: Primary Care

## 2020-07-01 NOTE — Assessment & Plan Note (Addendum)
-   Patient gets thrush symptoms 2-3 times a year, if continues or worsens may need to consider removing ICS - Rx Nystain S&S 59ml 2-4 times a day PRN oral thrush

## 2020-07-01 NOTE — Assessment & Plan Note (Addendum)
-   Well controlled on current regimen. No recent exacerbations or acute complaints. Uses SABA once daily max. - Continue Trelegy Elipta 100 one puff daily; PRN Albuterol 2 puffs q 4-6 hours - Can follow-up with Pulmonary as needed basis, PCP can manage Trelegy prescription if she continues doing well

## 2020-07-08 ENCOUNTER — Inpatient Hospital Stay (HOSPITAL_COMMUNITY)
Admission: EM | Admit: 2020-07-08 | Discharge: 2020-07-10 | DRG: 190 | Disposition: A | Discharge status: Home / Self Care | Payer: Medicare HMO | Attending: Internal Medicine | Admitting: Internal Medicine

## 2020-07-08 ENCOUNTER — Other Ambulatory Visit: Payer: Self-pay

## 2020-07-08 ENCOUNTER — Emergency Department (HOSPITAL_COMMUNITY): Payer: Medicare HMO

## 2020-07-08 ENCOUNTER — Encounter (HOSPITAL_COMMUNITY): Payer: Self-pay | Admitting: Internal Medicine

## 2020-07-08 DIAGNOSIS — J9621 Acute and chronic respiratory failure with hypoxia: Secondary | ICD-10-CM | POA: Diagnosis present

## 2020-07-08 DIAGNOSIS — N179 Acute kidney failure, unspecified: Secondary | ICD-10-CM | POA: Diagnosis present

## 2020-07-08 DIAGNOSIS — Z79899 Other long term (current) drug therapy: Secondary | ICD-10-CM

## 2020-07-08 DIAGNOSIS — C189 Malignant neoplasm of colon, unspecified: Secondary | ICD-10-CM | POA: Diagnosis present

## 2020-07-08 DIAGNOSIS — Z794 Long term (current) use of insulin: Secondary | ICD-10-CM

## 2020-07-08 DIAGNOSIS — Z7952 Long term (current) use of systemic steroids: Secondary | ICD-10-CM

## 2020-07-08 DIAGNOSIS — E785 Hyperlipidemia, unspecified: Secondary | ICD-10-CM | POA: Diagnosis present

## 2020-07-08 DIAGNOSIS — I639 Cerebral infarction, unspecified: Secondary | ICD-10-CM

## 2020-07-08 DIAGNOSIS — Z87891 Personal history of nicotine dependence: Secondary | ICD-10-CM

## 2020-07-08 DIAGNOSIS — J449 Chronic obstructive pulmonary disease, unspecified: Secondary | ICD-10-CM | POA: Diagnosis not present

## 2020-07-08 DIAGNOSIS — I10 Essential (primary) hypertension: Secondary | ICD-10-CM | POA: Diagnosis present

## 2020-07-08 DIAGNOSIS — S42462A Displaced fracture of medial condyle of left humerus, initial encounter for closed fracture: Secondary | ICD-10-CM

## 2020-07-08 DIAGNOSIS — J9601 Acute respiratory failure with hypoxia: Secondary | ICD-10-CM | POA: Diagnosis not present

## 2020-07-08 DIAGNOSIS — N319 Neuromuscular dysfunction of bladder, unspecified: Secondary | ICD-10-CM | POA: Diagnosis present

## 2020-07-08 DIAGNOSIS — I4891 Unspecified atrial fibrillation: Secondary | ICD-10-CM | POA: Diagnosis present

## 2020-07-08 DIAGNOSIS — Z7982 Long term (current) use of aspirin: Secondary | ICD-10-CM

## 2020-07-08 DIAGNOSIS — R918 Other nonspecific abnormal finding of lung field: Secondary | ICD-10-CM

## 2020-07-08 DIAGNOSIS — E119 Type 2 diabetes mellitus without complications: Secondary | ICD-10-CM | POA: Diagnosis present

## 2020-07-08 DIAGNOSIS — K222 Esophageal obstruction: Secondary | ICD-10-CM | POA: Diagnosis present

## 2020-07-08 DIAGNOSIS — Z85038 Personal history of other malignant neoplasm of large intestine: Secondary | ICD-10-CM

## 2020-07-08 DIAGNOSIS — Z8673 Personal history of transient ischemic attack (TIA), and cerebral infarction without residual deficits: Secondary | ICD-10-CM

## 2020-07-08 DIAGNOSIS — Z9049 Acquired absence of other specified parts of digestive tract: Secondary | ICD-10-CM

## 2020-07-08 DIAGNOSIS — Z20822 Contact with and (suspected) exposure to covid-19: Secondary | ICD-10-CM | POA: Diagnosis present

## 2020-07-08 DIAGNOSIS — W109XXA Fall (on) (from) unspecified stairs and steps, initial encounter: Secondary | ICD-10-CM | POA: Diagnosis present

## 2020-07-08 DIAGNOSIS — N390 Urinary tract infection, site not specified: Secondary | ICD-10-CM | POA: Diagnosis present

## 2020-07-08 DIAGNOSIS — Z833 Family history of diabetes mellitus: Secondary | ICD-10-CM

## 2020-07-08 DIAGNOSIS — Z825 Family history of asthma and other chronic lower respiratory diseases: Secondary | ICD-10-CM

## 2020-07-08 DIAGNOSIS — K219 Gastro-esophageal reflux disease without esophagitis: Secondary | ICD-10-CM | POA: Diagnosis present

## 2020-07-08 DIAGNOSIS — B370 Candidal stomatitis: Secondary | ICD-10-CM | POA: Diagnosis present

## 2020-07-08 DIAGNOSIS — E118 Type 2 diabetes mellitus with unspecified complications: Secondary | ICD-10-CM

## 2020-07-08 DIAGNOSIS — J439 Emphysema, unspecified: Secondary | ICD-10-CM | POA: Diagnosis present

## 2020-07-08 DIAGNOSIS — E559 Vitamin D deficiency, unspecified: Secondary | ICD-10-CM | POA: Diagnosis present

## 2020-07-08 LAB — CBC WITH DIFFERENTIAL/PLATELET
Abs Immature Granulocytes: 0.04 10*3/uL (ref 0.00–0.07)
Basophils Absolute: 0.1 10*3/uL (ref 0.0–0.1)
Basophils Relative: 1 %
Eosinophils Absolute: 0.1 10*3/uL (ref 0.0–0.5)
Eosinophils Relative: 1 %
HCT: 36.2 % (ref 36.0–46.0)
Hemoglobin: 11.4 g/dL — ABNORMAL LOW (ref 12.0–15.0)
Immature Granulocytes: 0 %
Lymphocytes Relative: 7 %
Lymphs Abs: 0.8 10*3/uL (ref 0.7–4.0)
MCH: 29.5 pg (ref 26.0–34.0)
MCHC: 31.5 g/dL (ref 30.0–36.0)
MCV: 93.5 fL (ref 80.0–100.0)
Monocytes Absolute: 0.7 10*3/uL (ref 0.1–1.0)
Monocytes Relative: 6 %
Neutro Abs: 9.4 10*3/uL — ABNORMAL HIGH (ref 1.7–7.7)
Neutrophils Relative %: 85 %
Platelets: 267 10*3/uL (ref 150–400)
RBC: 3.87 MIL/uL (ref 3.87–5.11)
RDW: 12.4 % (ref 11.5–15.5)
WBC: 11.2 10*3/uL — ABNORMAL HIGH (ref 4.0–10.5)
nRBC: 0 % (ref 0.0–0.2)

## 2020-07-08 LAB — RESP PANEL BY RT-PCR (FLU A&B, COVID) ARPGX2
Influenza A by PCR: NEGATIVE
Influenza B by PCR: NEGATIVE
SARS Coronavirus 2 by RT PCR: NEGATIVE

## 2020-07-08 LAB — HEMOGLOBIN A1C
Hgb A1c MFr Bld: 7 % — ABNORMAL HIGH (ref 4.8–5.6)
Mean Plasma Glucose: 154.2 mg/dL

## 2020-07-08 LAB — COMPREHENSIVE METABOLIC PANEL
ALT: 19 U/L (ref 0–44)
AST: 21 U/L (ref 15–41)
Albumin: 3.5 g/dL (ref 3.5–5.0)
Alkaline Phosphatase: 53 U/L (ref 38–126)
Anion gap: 10 (ref 5–15)
BUN: 24 mg/dL — ABNORMAL HIGH (ref 8–23)
CO2: 24 mmol/L (ref 22–32)
Calcium: 9.2 mg/dL (ref 8.9–10.3)
Chloride: 101 mmol/L (ref 98–111)
Creatinine, Ser: 1.59 mg/dL — ABNORMAL HIGH (ref 0.44–1.00)
GFR, Estimated: 32 mL/min — ABNORMAL LOW (ref >60)
Glucose, Bld: 133 mg/dL — ABNORMAL HIGH (ref 70–99)
Potassium: 4 mmol/L (ref 3.5–5.1)
Sodium: 135 mmol/L (ref 135–145)
Total Bilirubin: 1.5 mg/dL — ABNORMAL HIGH (ref 0.3–1.2)
Total Protein: 6.6 g/dL (ref 6.5–8.1)

## 2020-07-08 LAB — CBG MONITORING, ED: Glucose-Capillary: 254 mg/dL — ABNORMAL HIGH (ref 70–99)

## 2020-07-08 LAB — CK: Total CK: 101 U/L (ref 38–234)

## 2020-07-08 LAB — BRAIN NATRIURETIC PEPTIDE: B Natriuretic Peptide: 79.6 pg/mL (ref 0.0–100.0)

## 2020-07-08 MED ORDER — FLUTICASONE FUROATE-VILANTEROL 100-25 MCG/INH IN AEPB
1.0000 | INHALATION_SPRAY | Freq: Every day | RESPIRATORY_TRACT | Status: DC
  Administered 2020-07-10: 1 via RESPIRATORY_TRACT
  Filled 2020-07-08: qty 28

## 2020-07-08 MED ORDER — ASPIRIN EC 81 MG PO TBEC
81.0000 mg | DELAYED_RELEASE_TABLET | Freq: Every day | ORAL | Status: DC
  Administered 2020-07-09 – 2020-07-10 (×2): 81 mg via ORAL
  Filled 2020-07-08 (×2): qty 1

## 2020-07-08 MED ORDER — ENOXAPARIN SODIUM 40 MG/0.4ML ~~LOC~~ SOLN
40.0000 mg | SUBCUTANEOUS | Status: DC
  Administered 2020-07-08 – 2020-07-09 (×2): 40 mg via SUBCUTANEOUS
  Filled 2020-07-08 (×2): qty 0.4

## 2020-07-08 MED ORDER — AEROCHAMBER PLUS FLO-VU MEDIUM MISC
1.0000 | Freq: Once | Status: DC
  Filled 2020-07-08: qty 1

## 2020-07-08 MED ORDER — CIPROFLOXACIN HCL 500 MG PO TABS
500.0000 mg | ORAL_TABLET | Freq: Three times a day (TID) | ORAL | Status: DC
  Administered 2020-07-08 – 2020-07-10 (×6): 500 mg via ORAL
  Filled 2020-07-08 (×7): qty 1

## 2020-07-08 MED ORDER — NYSTATIN 100000 UNIT/ML MT SUSP
5.0000 mL | Freq: Two times a day (BID) | OROMUCOSAL | Status: DC
  Administered 2020-07-08 – 2020-07-10 (×4): 500000 [IU] via ORAL
  Filled 2020-07-08 (×5): qty 5

## 2020-07-08 MED ORDER — ALBUTEROL SULFATE (2.5 MG/3ML) 0.083% IN NEBU
2.5000 mg | INHALATION_SOLUTION | RESPIRATORY_TRACT | Status: DC | PRN
  Administered 2020-07-10: 2.5 mg via RESPIRATORY_TRACT

## 2020-07-08 MED ORDER — FERROUS SULFATE 325 (65 FE) MG PO TABS
325.0000 mg | ORAL_TABLET | Freq: Every day | ORAL | Status: DC
  Administered 2020-07-09 – 2020-07-10 (×2): 325 mg via ORAL
  Filled 2020-07-08 (×2): qty 1

## 2020-07-08 MED ORDER — ACETAMINOPHEN 650 MG RE SUPP: 650.0000 mg | Freq: Four times a day (QID) | RECTAL | Status: DC | PRN

## 2020-07-08 MED ORDER — SODIUM CHLORIDE 0.9 % IV BOLUS
500.0000 mL | Freq: Once | INTRAVENOUS | Status: AC
  Administered 2020-07-08: 500 mL via INTRAVENOUS

## 2020-07-08 MED ORDER — SODIUM CHLORIDE 0.9% FLUSH
3.0000 mL | Freq: Two times a day (BID) | INTRAVENOUS | Status: DC
  Administered 2020-07-08 – 2020-07-09 (×3): 3 mL via INTRAVENOUS

## 2020-07-08 MED ORDER — SODIUM CHLORIDE 0.9 % IV SOLN: INTRAVENOUS | Status: DC

## 2020-07-08 MED ORDER — POLYETHYLENE GLYCOL 3350 17 G PO PACK: 17.0000 g | PACK | Freq: Every day | ORAL | Status: DC | PRN

## 2020-07-08 MED ORDER — UMECLIDINIUM BROMIDE 62.5 MCG/INH IN AEPB
1.0000 | INHALATION_SPRAY | Freq: Every day | RESPIRATORY_TRACT | Status: DC
  Administered 2020-07-10: 1 via RESPIRATORY_TRACT
  Filled 2020-07-08: qty 7

## 2020-07-08 MED ORDER — ALBUTEROL SULFATE HFA 108 (90 BASE) MCG/ACT IN AERS
2.0000 | INHALATION_SPRAY | Freq: Once | RESPIRATORY_TRACT | Status: AC
  Administered 2020-07-08: 2 via RESPIRATORY_TRACT
  Filled 2020-07-08: qty 6.7

## 2020-07-08 MED ORDER — FLUTICASONE-UMECLIDIN-VILANT 100-62.5-25 MCG/INH IN AEPB: 1.0000 | INHALATION_SPRAY | Freq: Every day | RESPIRATORY_TRACT | Status: DC

## 2020-07-08 MED ORDER — ACETAMINOPHEN 325 MG PO TABS: 650.0000 mg | ORAL_TABLET | Freq: Four times a day (QID) | ORAL | Status: DC | PRN

## 2020-07-08 MED ORDER — INSULIN ASPART 100 UNIT/ML ~~LOC~~ SOLN
0.0000 [IU] | Freq: Three times a day (TID) | SUBCUTANEOUS | Status: DC
  Administered 2020-07-09: 1 [IU] via SUBCUTANEOUS
  Administered 2020-07-09: 2 [IU] via SUBCUTANEOUS
  Administered 2020-07-10: 5 [IU] via SUBCUTANEOUS
  Administered 2020-07-10: 1 [IU] via SUBCUTANEOUS
  Administered 2020-07-10: 2 [IU] via SUBCUTANEOUS

## 2020-07-08 NOTE — ED Provider Notes (Signed)
[facility] EMERGENCY DEPARTMENT Provider Note   CSN: [id number] Arrival date & time: 07/08/20  1010     History Chief Complaint  Patient presents with  . Fall    Dalasia Predmore is a 79 y.o. female.  Patient with history of right shoulder surgery, atrial fibrillation, aspirin use, COPD, colon cancer status post colectomy, UTI currently on antibiotics --presents to the emergency department after a fall occurring yesterday.  Patient states that she was by herself carrying groceries down the stairs.  She lost her balance and fell striking the side of her head.  She does not report preceding chest pain, shortness of breath.  No loss of consciousness.  Patient was able to crawl a little bit but not call for help.  She states she was mainly on her back.  This morning her son found her and called ambulance.  She was on the ground for about 16 hours.  She currently complains of pain in her right shoulder, left elbow, lower back, right hip.  Pain is worse with movement.  Cervical collar placed prior to arrival.        Past Medical History:  Diagnosis Date  . Arrhythmia    [id number] of colon surgery 01-12-14 - no problems since.  . Blood transfusion without reported diagnosis   . Cancer ([place])    colon cancer 01-12-14- no further tx. intended  . Candida esophagitis ([place]) 01/09/2014  . Cataract   . Closed fracture of unspecified part of upper end of humerus   . Complete rupture of rotator cuff    right shoulder- limited range of motion  . COPD (chronic obstructive pulmonary disease) ([place])   . Emphysema lung ([place])   . Emphysema of lung ([place])   . Esophageal stricture 12/28/2013  . Essential hypertension, benign    off lisinporil for last 2 months  . GERD (gastroesophageal reflux disease)   . HCAP (healthcare-associated pneumonia) 03/28/2014  . Hx of adenomatous colonic polyps 05/15/2015  . Hyperlipidemia   . Neurogenic bladder, NOS    02-22-14 some bladder issues of  urgency is somewhat improve.  . Other vitamin B12 deficiency anemia   . Type II or unspecified type diabetes mellitus without mention of complication, uncontrolled    diet controlled  . Vitamin D deficiency     Patient Active Problem List   Diagnosis Date Noted  . Abnormal CXR 09/18/2016  . RSV bronchiolitis 08/10/2016  . COPD exacerbation ([place]) 08/09/2016  . Community acquired pneumonia 08/09/2016  . Hyponatremia 08/09/2016  . Hyperkalemia 08/09/2016  . Hyperbilirubinemia 08/09/2016  . Esophageal stricture   . Hx of adenomatous colonic polyps 05/15/2015  . Thrush, oral 10/25/2014  . Chronic respiratory failure with hypoxia ([place]) 05/18/2014  . Sepsis, unspecified organism ([place]) 03/28/2014  . Atrial fibrillation with RVR ([place]) 03/28/2014  . Right colon cancer (Stage I) s/p partial colectomy 01/12/14 01/10/2014  . Stricture and stenosis of esophagus 01/09/2014  . COPD (chronic obstructive pulmonary disease) ([place]) 01/08/2014  . GERD (gastroesophageal reflux disease)   . Hyperlipidemia   . Type 2 diabetes mellitus with complication, with long-term current use of insulin ([place])   . Essential hypertension, benign     Past Surgical History:  Procedure Laterality Date  . BALLOON DILATION N/A 03/02/2014   Procedure: BALLOON DILATION;  Surgeon: Gatha Mayer, MD;  Location: [facility];  Service: Endoscopy;  Laterality: N/A;  . CATARACT EXTRACTION, BILATERAL Bilateral   . COLON SURGERY    . COLONOSCOPY N/A 01/10/2014  Procedure: COLONOSCOPY;  Surgeon: [name], MD;  Location: [facility];  Service: Endoscopy;  Laterality: N/A;  . DILATION AND CURETTAGE OF UTERUS    . ESOPHAGOGASTRODUODENOSCOPY N/A 01/16/2014   Procedure: ESOPHAGOGASTRODUODENOSCOPY (EGD);  Surgeon: Gatha Mayer, MD;  Location: [facility];  Service: Endoscopy;  Laterality: N/A;  . ESOPHAGOGASTRODUODENOSCOPY N/A 03/02/2014   Procedure: ESOPHAGOGASTRODUODENOSCOPY (EGD);  Surgeon: Gatha Mayer, MD;  Location:  [facility];  Service: Endoscopy;  Laterality: N/A;  . ESOPHAGOGASTRODUODENOSCOPY N/A 08/21/2015   Procedure: ESOPHAGOGASTRODUODENOSCOPY (EGD);  Surgeon: Gatha Mayer, MD;  Location: [facility];  Service: Endoscopy;  Laterality: N/A;  . ESOPHAGOGASTRODUODENOSCOPY (EGD) WITH PROPOFOL N/A 01/09/2014   Procedure: ESOPHAGOGASTRODUODENOSCOPY (EGD) WITH PROPOFOL;  Surgeon: [name], MD;  Location: [facility];  Service: Endoscopy;  Laterality: N/A;  . ESOPHAGOGASTRODUODENOSCOPY (EGD) WITH PROPOFOL N/A 01/19/2014   Procedure: ESOPHAGOGASTRODUODENOSCOPY (EGD) WITH PROPOFOL;  Surgeon: Gatha Mayer, MD;  Location: [facility];  Service: Endoscopy;  Laterality: N/A;  . EYE SURGERY Bilateral 2010   both eyes lens replacments  . LAPAROSCOPIC PARTIAL COLECTOMY N/A 01/12/2014   Procedure: LAPAROSCOPIC ASSISTED PARTIAL COLECTOMY AND REMOVAL OF RECTAL POLYP;  Surgeon: Odis Hollingshead, MD;  Location: [facility];  Service: General;  Laterality: N/A;  . ORIF SHOULDER FRACTURE Right 2011   arthroplasty  . SAVORY DILATION N/A 08/21/2015   Procedure: SAVORY DILATION;  Surgeon: Gatha Mayer, MD;  Location[facility];  Service: Endoscopy;  Laterality: N/A;  . TONSILLECTOMY AND ADENOIDECTOMY  age 28 or 3     OB History   No obstetric history on file.     Family History  Problem Relation Age of Onset  . Diabetes Father   . Bone cancer Father        bone marrow  . Emphysema Mother   . Pneumonia Mother   . Colon cancer Neg Hx   . Esophageal cancer Neg Hx   . Stomach cancer Neg Hx   . Rectal cancer Neg Hx     Social History   Tobacco Use  . Smoking status: Former Smoker    Packs/day: 1.00    Years: 20.00    Pack years: 20.00    Types: Cigarettes    Quit date: 08/03/2005    Years since quitting: 14.9  . Smokeless tobacco: Never Used  Vaping Use  . Vaping Use: Never used  Substance Use Topics  . Alcohol use: No    Alcohol/week: 0.0 standard drinks  . Drug use: No    Home  Medications Prior to Admission medications   Medication Sig Start Date End Date Taking? Authorizing Provider  albuterol (VENTOLIN HFA) 108 (90 Base) MCG/ACT inhaler Inhale 2 puffs into the lungs every 6 (six) hours as needed for wheezing or shortness of breath. 06/09/19   Julian Hy, DO  aspirin EC 81 MG EC tablet Take 1 tablet (81 mg total) by mouth daily. 11/09/14   Belva Crome, MD  ferrous sulfate 325 (65 FE) MG tablet Take 1 tablet (325 mg total) by mouth daily. 01/01/14   Drenda Freeze, MD  Fluticasone-Umeclidin-Vilant (TRELEGY ELLIPTA) 100-62.5-25 MCG/INH AEPB INHALE 1 PUFF INTO THE LUNGS ONCE DAILY 06/21/20   Martyn Ehrich, NP  hydrochlorothiazide (MICROZIDE) 12.5 MG capsule Take 12.5 mg by mouth every morning. 06/02/19   [provider]  insulin NPH-regular Human (NOVOLIN 70/30) (70-30) 100 UNIT/ML injection Inject 6 Units into the skin daily with breakfast.    [provider]  nitrofurantoin (MACRODANTIN)  100 MG capsule Take 100 mg by mouth 2 (two) times daily. 03/04/20   [provider]  nystatin (MYCOSTATIN) 100000 UNIT/ML suspension Take 5 mLs (500,000 Units total) by mouth 2 (two) times daily. 06/21/20   Martyn Ehrich, NP  omeprazole (PRILOSEC) 40 MG capsule TAKE 1 CAPSULE BY MOUTH TWICE DAILY 30 MINS BEFORE BREAKFAST  AND SUPPER 04/30/20   Gatha Mayer, MD  oxybutynin (DITROPAN) 5 MG tablet Take 5 mg by mouth 2 (two) times daily. 01/23/16   [provider]  Vitamin D, Ergocalciferol, (DRISDOL) 50000 units CAPS capsule Take 50,000 Units by mouth every 7 (seven) days. On fridays    [provider]    Allergies    Patient has no known allergies.  Review of Systems   Review of Systems  Constitutional: Negative for fever.  HENT: Negative for rhinorrhea and sore throat.   Eyes: Negative for redness.  Respiratory: Negative for cough and shortness of breath.   Cardiovascular: Negative for chest pain.  Gastrointestinal:  Negative for abdominal pain, diarrhea, nausea and vomiting.  Genitourinary: Negative for dysuria, frequency, hematuria and urgency.  Musculoskeletal: Positive for arthralgias, back pain, myalgias and neck pain.  Skin: Negative for color change and rash.  Neurological: Positive for headaches.    Physical Exam Updated Vital Signs BP (!) 200/93   Pulse (!) 109   Temp 98.7 F (37.1 C) (Oral)   Resp (!) 21   SpO2 95%   Physical Exam Vitals and nursing note reviewed.  Constitutional:      General: She is not in acute distress.    Appearance: She is well-developed.  HENT:     Head: Normocephalic and atraumatic.     Right Ear: External ear normal.     Left Ear: External ear normal.     Nose: Nose normal. No congestion.     Mouth/Throat:     Mouth: Mucous membranes are moist.  Eyes:     Extraocular Movements: Extraocular movements intact.     Conjunctiva/sclera: Conjunctivae normal.     Pupils: Pupils are equal, round, and reactive to light.  Neck:     Comments: Immobilized in cervical collar. Cardiovascular:     Rate and Rhythm: Regular rhythm. Tachycardia present.     Heart sounds: No murmur heard.   Pulmonary:     Effort: No respiratory distress.     Breath sounds: No wheezing, rhonchi or rales.  Abdominal:     Palpations: Abdomen is soft.     Tenderness: There is no abdominal tenderness. There is no guarding or rebound.  Musculoskeletal:     Right shoulder: Tenderness present. Decreased range of motion.     Left shoulder: No tenderness. Normal range of motion.     Right upper arm: No tenderness.     Left upper arm: No tenderness.     Right elbow: Normal range of motion. No tenderness.     Left elbow: Decreased range of motion. Tenderness present in olecranon process.     Right wrist: Normal.     Left wrist: Normal.     Right hand: Normal.     Left hand: Normal.     Cervical back: Neck supple. Tenderness present.     Thoracic back: Tenderness present.     Lumbar  back: Tenderness present.     Right hip: Tenderness (Anteriorly over iliac crest) present. Normal range of motion.     Left hip: No tenderness. Normal range of motion.  Right knee: Normal range of motion. No tenderness.     Left knee: Normal range of motion. No tenderness.     Right lower leg: No edema.     Left lower leg: No edema.     Right ankle: No tenderness. Normal range of motion.     Left ankle: No tenderness. Normal range of motion.  Skin:    General: Skin is warm and dry.     Findings: No rash.  Neurological:     General: No focal deficit present.     Mental Status: She is alert. Mental status is at baseline.     Motor: No weakness.  Psychiatric:        Mood and Affect: Mood normal.     ED Results / Procedures / Treatments   Labs (all labs ordered are listed, but only abnormal results are displayed) Labs Reviewed  CBC WITH DIFFERENTIAL/PLATELET - Abnormal; Notable for the following components:      Result Value   WBC 11.2 (*)    Hemoglobin 11.4 (*)    Neutro Abs 9.4 (*)    All other components within normal limits  COMPREHENSIVE METABOLIC PANEL - Abnormal; Notable for the following components:   Glucose, Bld 133 (*)    BUN 24 (*)    Creatinine, Ser 1.59 (*)    Total Bilirubin 1.5 (*)    GFR, Estimated 32 (*)    All other components within normal limits  CK  URINALYSIS, ROUTINE W REFLEX MICROSCOPIC    EKG EKG Interpretation  Date/Time:  Monday July 08 2020 10:14:55 EST Ventricular Rate:  110 PR Interval:    QRS Duration: 80 QT Interval:  326 QTC Calculation: 441 R Axis:   31 Text Interpretation: Sinus tachycardia Minimal ST depression, anterior leads no stemi Confirmed by Madalyn Rob [phone]) on 07/08/2020 10:18:46 AM   Radiology DG Pelvis 1-2 Views  Result Date: 07/08/2020 CLINICAL DATA:  Fall EXAM: PELVIS - 1-2 VIEW COMPARISON:  08/03/2019 FINDINGS: There is no evidence of pelvic fracture or diastasis. Sacrum partially obscured by  overlying bowel gas. Mild diffuse osteopenia. IMPRESSION: Negative. Electronically Signed   By: Davina Poke D.O.   On: 07/08/2020 11:17   DG Shoulder Right  Result Date: 07/08/2020 CLINICAL DATA:  Fall, shoulder pain EXAM: RIGHT SHOULDER - 2+ VIEW COMPARISON:  07/16/2010, 08/03/2019, 08/09/2016 FINDINGS: Status post right shoulder hemiarthroplasty. The humeral component is superiorly dislocated relative to the glenoid. This alignment is unchanged from prior x-ray 08/09/2016. There is secondary remodeling of the underlying acromion and distal clavicle. No evidence of a periprosthetic fracture. Bones are demineralized. IMPRESSION: 1. 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 Alignment is unchanged dating back to at least 2018. 2. No evidence of a periprosthetic fracture. Electronically Signed   By: Davina Poke D.O.   On: 07/08/2020 11:21   DG Elbow Complete Left  Result Date: 07/08/2020 CLINICAL DATA:  [place] with elbow pain. EXAM: LEFT ELBOW - COMPLETE 3+ VIEW COMPARISON:  None. FINDINGS: Elbow joint effusion. Fracture of the medial epicondyle of the humerus, displaced proximally about 3 mm. Articular surfaces not involved. IMPRESSION: Medial epicondyle fracture with joint effusion. Electronically Signed   By: Nelson Chimes M.D.   On: 07/08/2020 11:18   CT Head Wo Contrast  Result Date: 07/08/2020 CLINICAL DATA:  Fall, hit back of head EXAM: CT HEAD WITHOUT CONTRAST TECHNIQUE: Contiguous axial images were obtained from the base of the skull  through the vertex without intravenous contrast. COMPARISON:  None. FINDINGS: Brain: There is atrophy and chronic small vessel disease changes. Infarct seen within the right frontal lobe, age indeterminate. No hemorrhage or hydrocephalus. Old right basal ganglia lacunar infarct. Vascular: No hyperdense vessel or unexpected calcification. Skull: No acute calvarial abnormality.  Sinuses/Orbits: Visualized paranasal sinuses and mastoids clear. Orbital soft tissues unremarkable. Other: None IMPRESSION: Age indeterminate right frontal infarct. Old right basal ganglia lacunar infarct. Atrophy, chronic small vessel disease. Electronically Signed   By: Rolm Baptise M.D.   On: 07/08/2020 11:55   CT Cervical Spine Wo Contrast  Result Date: 07/08/2020 CLINICAL DATA:  Fall down steps with pain. EXAM: CT CERVICAL, THORACIC, AND LUMBAR SPINE WITHOUT CONTRAST TECHNIQUE: Multidetector CT imaging of the cervical, thoracic and lumbar spine was performed without intravenous contrast. Multiplanar CT image reconstructions were also generated. COMPARISON:  Chest radiographs 08/03/2019. Cervical radiographs 08/03/2019. FINDINGS: CT CERVICAL SPINE FINDINGS Alignment: Mild anterolisthesis of C4 on C5, favored degenerative. Otherwise, no substantial subluxation. Skull base and vertebrae: No acute fracture. Vertebral body heights are maintained. Osteopenia. Soft tissues and spinal canal: No prevertebral fluid or swelling. No visible canal hematoma. Disc levels:  Multilevel facet hypertrophy. CT THORACIC SPINE FINDINGS Alignment: Normal. Vertebrae: There is a remote T3 compression fracture with similar height loss when comparing to prior chest radiograph from 08/03/2019. Mild T1 vertebral body height loss without trabecular sclerosis or discrete lucency. Osteopenia. Paraspinal and other soft tissues: Emphysema. Masslike consolidation in the right upper lobe. Disc levels: No substantial focal degenerative change. CT LUMBAR SPINE FINDINGS Alignment: Normal. Vertebrae: No acute fracture or focal pathologic process. Osteopenia. Paraspinal and other soft tissues: Calcific atherosclerosis of the aorta and bilateral iliac vessels. Bilateral renal cysts, incompletely characterized. Hypodense right adrenal nodule, compatible with an adrenal adenoma. Disc levels: Mild degenerative disc disease at multiple levels. Disc  bulge at L3-L4 and L4-L5 with likely at least mild canal stenosis. IMPRESSION: 1. Age indeterminate T1 compression fracture with mild height loss, favored remote given lack of trabecular sclerosis or discrete lucency. Correlate with the presence or absence of point tenderness. MRI could better evaluate acuity if clinically indicated. 2. Remote T3 compression fracture with similar height loss. 3. Masslike consolidation in the right upper lobe. Recommend dedicated chest CT to further characterize. Electronically Signed   By: Margaretha Sheffield MD   On: 07/08/2020 12:19   CT Thoracic Spine Wo Contrast  Result Date: 07/08/2020 CLINICAL DATA:  Fall down steps with pain. EXAM: CT CERVICAL, THORACIC, AND LUMBAR SPINE WITHOUT CONTRAST TECHNIQUE: Multidetector CT imaging of the cervical, thoracic and lumbar spine was performed without intravenous contrast. Multiplanar CT image reconstructions were also generated. COMPARISON:  Chest radiographs 08/03/2019. Cervical radiographs 08/03/2019. FINDINGS: CT CERVICAL SPINE FINDINGS Alignment: Mild anterolisthesis of C4 on C5, favored degenerative. Otherwise, no substantial subluxation. Skull base and vertebrae: No acute fracture. Vertebral body heights are maintained. Osteopenia. Soft tissues and spinal canal: No prevertebral fluid or swelling. No visible canal hematoma. Disc levels:  Multilevel facet hypertrophy. CT THORACIC SPINE FINDINGS Alignment: Normal. Vertebrae: There is a remote T3 compression fracture with similar height loss when comparing to prior chest radiograph from 08/03/2019. Mild T1 vertebral body height loss without trabecular sclerosis or discrete lucency. Osteopenia. Paraspinal and other soft tissues: Emphysema. Masslike consolidation in the right upper lobe. Disc levels: No substantial focal degenerative change. CT LUMBAR SPINE FINDINGS Alignment: Normal. Vertebrae: No acute fracture or focal pathologic process. Osteopenia. Paraspinal and other soft tissues:  Calcific atherosclerosis of  the aorta and bilateral iliac vessels. Bilateral renal cysts, incompletely characterized. Hypodense right adrenal nodule, compatible with an adrenal adenoma. Disc levels: Mild degenerative disc disease at multiple levels. Disc bulge at L3-L4 and L4-L5 with likely at least mild canal stenosis. IMPRESSION: 1. Age indeterminate T1 compression fracture with mild height loss, favored remote given lack of trabecular sclerosis or discrete lucency. Correlate with the presence or absence of point tenderness. MRI could better evaluate acuity if clinically indicated. 2. Remote T3 compression fracture with similar height loss. 3. Masslike consolidation in the right upper lobe. Recommend dedicated chest CT to further characterize. Electronically Signed   By: Margaretha Sheffield MD   On: 07/08/2020 12:19   CT Lumbar Spine Wo Contrast  Result Date: 07/08/2020 CLINICAL DATA:  Fall down steps with pain. EXAM: CT CERVICAL, THORACIC, AND LUMBAR SPINE WITHOUT CONTRAST TECHNIQUE: Multidetector CT imaging of the cervical, thoracic and lumbar spine was performed without intravenous contrast. Multiplanar CT image reconstructions were also generated. COMPARISON:  Chest radiographs 08/03/2019. Cervical radiographs 08/03/2019. FINDINGS: CT CERVICAL SPINE FINDINGS Alignment: Mild anterolisthesis of C4 on C5, favored degenerative. Otherwise, no substantial subluxation. Skull base and vertebrae: No acute fracture. Vertebral body heights are maintained. Osteopenia. Soft tissues and spinal canal: No prevertebral fluid or swelling. No visible canal hematoma. Disc levels:  Multilevel facet hypertrophy. CT THORACIC SPINE FINDINGS Alignment: Normal. Vertebrae: There is a remote T3 compression fracture with similar height loss when comparing to prior chest radiograph from 08/03/2019. Mild T1 vertebral body height loss without trabecular sclerosis or discrete lucency. Osteopenia. Paraspinal and other soft tissues:  Emphysema. Masslike consolidation in the right upper lobe. Disc levels: No substantial focal degenerative change. CT LUMBAR SPINE FINDINGS Alignment: Normal. Vertebrae: No acute fracture or focal pathologic process. Osteopenia. Paraspinal and other soft tissues: Calcific atherosclerosis of the aorta and bilateral iliac vessels. Bilateral renal cysts, incompletely characterized. Hypodense right adrenal nodule, compatible with an adrenal adenoma. Disc levels: Mild degenerative disc disease at multiple levels. Disc bulge at L3-L4 and L4-L5 with likely at least mild canal stenosis. IMPRESSION: 1. Age indeterminate T1 compression fracture with mild height loss, favored remote given lack of trabecular sclerosis or discrete lucency. Correlate with the presence or absence of point tenderness. MRI could better evaluate acuity if clinically indicated. 2. Remote T3 compression fracture with similar height loss. 3. Masslike consolidation in the right upper lobe. Recommend dedicated chest CT to further characterize. Electronically Signed   By: Margaretha Sheffield MD   On: 07/08/2020 12:19    Procedures Procedures (including critical care time)  Medications Ordered in ED Medications  sodium chloride 0.9 % bolus 500 mL (has no administration in time range)    ED Course  I have reviewed the triage vital signs and the nursing notes.  Pertinent labs & imaging results that were available during my care of the patient were reviewed by me and considered in my medical decision making (see chart for details).  Patient seen and examined. Work-up initiated. EKG reviewed. Discussed with Dr. Roslynn Amble.   Vital signs reviewed and are as follows: BP (!) 200/93   Pulse (!) 109   Temp 98.7 F (37.1 C) (Oral)   Resp (!) 21   SpO2 95%   2:38 PM cervical collar removed earlier by myself.  Patient updated on results.  She is now in a hallway bed.  Discussed x-ray findings, Ortho involved.  Patient instructed to be placed into  a sling, follow-up with Dr. Percell Miller.   I just called  and spoke with the patient's daughter Santiago Glad.  We discussed all results including the elbow fracture need for follow-up.  Discussed also signs of old stroke on CT scan and the lung nodule/mass which will need to be followed by her doctor.  Discussed that this could be variety of different things including infection or lung cancer.  In regards to the stroke, daughter mentions that she has been more unsteady on her feet recently however no unilateral weakness or other stroke symptoms observed.  Awaiting ambulation trial as well as UA.  Patient appears comfortable.  3:21 PM Signout to [employer] at shift change.     MDM Rules/Calculators/A&P                          Patient with fall yesterday.  Patient reports carrying groceries downstairs and I suspect that the fall was mechanical in nature.  No focal neurologic deficits.  Patient has an elbow fracture.  Imaging otherwise as noted.  Pending UA.  Patient looks comfortable.  Incidental age indeterminate stroke on CT imaging will need follow-up by PCP for risk factor control.  Patient without any history of or current suggestion of focal neurologic complaints.  Do not suspect acute stroke.  Right upper lobe masslike consolidation noted incidentally on CT imaging.  Again, patient does not need emergent evaluation of this, but will need close follow-up with PCP.  Family has been updated on all results and need for follow-up as well as patient.    Final Clinical Impression(s) / ED Diagnoses Final diagnoses:  Closed displaced fracture of medial condyle of left humerus, initial encounter  Pulmonary mass  Cerebrovascular accident (CVA), unspecified mechanism [facility])    Rx / DC Orders ED Discharge Orders    None       Carlisle Cater, PA-C 07/08/20 1522    Lucrezia Starch, MD 07/09/20 [phone]

## 2020-07-08 NOTE — H&P (Addendum)
History and Physical   Amanda [name] WCH:[id number] DOB: [birth year] DOA: 07/08/2020  PCP: Timoteo Gaul, FNP   Patient coming from: Home  Chief Complaint: Fall  HPI: Amanda [name] is a 79 y.o. female with medical history significant of A. fib, COPD, esophageal stricture, GERD, hyperlipidemia, right colon cancer status post partial colectomy in 2015, diabetes who presents following a fall at home.  Yesterday evening patient was going up her garage steps with groceries and lost her balance and fell back into the concrete.  She is apparently down for close to 10 hours.  She did not lose consciousness she did hit her head.  And fell on her right arm.  Patient is feeling okay when seen, in sling with pain controled.  Patient was going to be discharged from ED but upon ambulation she became acutely hypoxic in the setting of not taking any of her medications for the past day and was unable to be safely disposed.  She is currently being treated for UTI with ciprofloxacin.  She denies fever, chills, cough, chest pain, shortness of breath, abdominal pain, nausea, diarrhea, constipation.  ED Course: Vitals significant for BP in the 353I to 144R systolic.  As above patient had an episode of desaturation to the 80s with ambulation (does not use oxygen at home at home).  Lab work-up showed BMP with creatinine of 1.59 increased from baseline around 1.2.  Glucose was also 131.  LFTs showed only mild elevation T bili to 1.5.  CBC showed leukocytosis to 11.2 in hemoglobin stable at 4.4.  CK was negative.  BNP is pending.  Respiratory panel for flu and Covid is negative.  Review of Systems: As per HPI otherwise all other systems reviewed and are negative.  Past Medical History:  Diagnosis Date  . Arrhythmia    7-23-15x1beginning of colon surgery 01-12-14 - no problems since.  . Blood transfusion without reported diagnosis   . Cancer ([place])    colon cancer 01-12-14- no further tx. intended  . Candida esophagitis  ([place]) 01/09/2014  . Cataract   . Closed fracture of unspecified part of upper end of humerus   . Complete rupture of rotator cuff    right shoulder- limited range of motion  . COPD (chronic obstructive pulmonary disease) ([place])   . Emphysema lung ([place])   . Emphysema of lung ([place])   . Esophageal stricture 12/28/2013  . Essential hypertension, benign    off lisinporil for last 2 months  . GERD (gastroesophageal reflux disease)   . HCAP (healthcare-associated pneumonia) 03/28/2014  . Hx of adenomatous colonic polyps 05/15/2015  . Hyperlipidemia   . Neurogenic bladder, NOS    02-22-14 some bladder issues of urgency is somewhat improve.  . Other vitamin B12 deficiency anemia   . Type II or unspecified type diabetes mellitus without mention of complication, uncontrolled    diet controlled  . Vitamin D deficiency     Past Surgical History:  Procedure Laterality Date  . BALLOON DILATION N/A 03/02/2014   Procedure: BALLOON DILATION;  Surgeon: Gatha Mayer, MD;  Location: [facility];  Service: Endoscopy;  Laterality: N/A;  . CATARACT EXTRACTION, BILATERAL Bilateral   . COLON SURGERY    . COLONOSCOPY N/A 01/10/2014   Procedure: COLONOSCOPY;  Surgeon: [name], MD;  Location: [facility];  Service: Endoscopy;  Laterality: N/A;  . DILATION AND CURETTAGE OF UTERUS    . ESOPHAGOGASTRODUODENOSCOPY N/A 01/16/2014   Procedure: ESOPHAGOGASTRODUODENOSCOPY (EGD);  Surgeon: Gatha Mayer, MD;  Location:  [facility];  Service: Endoscopy;  Laterality: N/A;  . ESOPHAGOGASTRODUODENOSCOPY N/A 03/02/2014   Procedure: ESOPHAGOGASTRODUODENOSCOPY (EGD);  Surgeon: Gatha Mayer, MD;  Location: [facility];  Service: Endoscopy;  Laterality: N/A;  . ESOPHAGOGASTRODUODENOSCOPY N/A 08/21/2015   Procedure: ESOPHAGOGASTRODUODENOSCOPY (EGD);  Surgeon: Gatha Mayer, MD;  Location: [facility];  Service: Endoscopy;  Laterality: N/A;  . ESOPHAGOGASTRODUODENOSCOPY (EGD) WITH PROPOFOL N/A 01/09/2014   Procedure:  ESOPHAGOGASTRODUODENOSCOPY (EGD) WITH PROPOFOL;  Surgeon: [name], MD;  [facility];  Service: Endoscopy;  Laterality: N/A;  . ESOPHAGOGASTRODUODENOSCOPY (EGD) WITH PROPOFOL N/A 01/19/2014   Procedure: ESOPHAGOGASTRODUODENOSCOPY (EGD) WITH PROPOFOL;  Surgeon: Gatha Mayer, MD;  [facility];  Service: Endoscopy;  Laterality: N/A;  . EYE SURGERY Bilateral 2010   both eyes lens replacments  . LAPAROSCOPIC PARTIAL COLECTOMY N/A 01/12/2014   Procedure: LAPAROSCOPIC ASSISTED PARTIAL COLECTOMY AND REMOVAL OF RECTAL POLYP;  Surgeon: Odis Hollingshead, MD;  Location: [facility];  Service: General;  Laterality: N/A;  . ORIF SHOULDER FRACTURE Right 2011   arthroplasty  . SAVORY DILATION N/A 08/21/2015   Procedure: SAVORY DILATION;  Surgeon: Gatha Mayer, MD;  [facility];  Service: Endoscopy;  Laterality: N/A;  . TONSILLECTOMY AND ADENOIDECTOMY  age 29 or 3    Social History  reports that she quit smoking about 14 years ago. Her smoking use included cigarettes. She has a 20.00 pack-year smoking history. She has never used smokeless tobacco. She reports that she does not drink alcohol and does not use drugs.  No Known Allergies  Family History  Problem Relation Age of Onset  . Diabetes Father   . Bone cancer Father        bone marrow  . Emphysema Mother   . Pneumonia Mother   . Colon cancer Neg Hx   . Esophageal cancer Neg Hx   . Stomach cancer Neg Hx   . Rectal cancer Neg Hx   Reviewed on admission  Prior to Admission medications   Medication Sig Start Date End Date Taking? Authorizing Provider  albuterol (VENTOLIN HFA) 108 (90 Base) MCG/ACT inhaler Inhale 2 puffs into the lungs every 6 (six) hours as needed for wheezing or shortness of breath. 06/09/19  Yes Julian Hy, DO  aspirin EC 81 MG EC tablet Take 1 tablet (81 mg total) by mouth daily. 11/09/14  Yes Belva Crome, MD  ciprofloxacin (CIPRO) 500 MG tablet Take 500 mg by mouth 3 (three) times  daily. 10 day supply 07/04/20  Yes [provider]  ferrous sulfate 325 (65 FE) MG tablet Take 1 tablet (325 mg total) by mouth daily. 01/01/14  Yes Drenda Freeze, MD  Fluticasone-Umeclidin-Vilant (TRELEGY ELLIPTA) 100-62.5-25 MCG/INH AEPB INHALE 1 PUFF INTO THE LUNGS ONCE DAILY Patient taking differently: Inhale 1 puff into the lungs daily.  06/21/20  Yes Martyn Ehrich, NP  hydrochlorothiazide (MICROZIDE) 12.5 MG capsule Take 12.5 mg by mouth every morning. 06/02/19  Yes [provider]  insulin NPH-regular Human (NOVOLIN 70/30) (70-30) 100 UNIT/ML injection Inject 6 Units into the skin daily with breakfast.   Yes [provider]  nystatin (MYCOSTATIN) 100000 UNIT/ML suspension Take 5 mLs (500,000 Units total) by mouth 2 (two) times daily. 06/21/20  Yes Martyn Ehrich, NP  Vitamin D, Ergocalciferol, (DRISDOL) 50000 units CAPS capsule Take 50,000 Units by mouth every 7 (seven) days. On fridays   Yes [provider]  omeprazole (PRILOSEC) 40 MG capsule TAKE 1 CAPSULE BY MOUTH TWICE DAILY  [place] SUPPER Patient not taking: Reported on 07/08/2020 04/30/20   Gatha Mayer, MD    Physical Exam: Vitals:   07/08/20 1723 07/08/20 1810 07/08/20 1920 07/08/20 2017  BP: (!) 184/82 (!) 157/68 (!) 166/89 (!) 173/84  Pulse: (!) 103 (!) 103 78 99  Resp: (!) 21 19 17 20   Temp:      TempSrc:      SpO2: 93% 99% 98% 99%   Physical Exam Constitutional:      General: She is not in acute distress.    Appearance: Normal appearance.     Comments: Elderly female  HENT:     Head: Normocephalic and atraumatic.     Mouth/Throat:     Mouth: Mucous membranes are moist.     Pharynx: Oropharynx is clear.  Eyes:     Extraocular Movements: Extraocular movements intact.     Pupils: Pupils are equal, round, and reactive to light.  Cardiovascular:     Rate and Rhythm: Normal rate and regular rhythm.     Pulses: Normal pulses.     Heart sounds:  Normal heart sounds.  Pulmonary:     Effort: Pulmonary effort is normal. No respiratory distress.     Breath sounds: Normal breath sounds.  Abdominal:     General: Bowel sounds are normal. There is no distension.     Palpations: Abdomen is soft.     Tenderness: There is no abdominal tenderness.  Musculoskeletal:        General: No swelling or deformity.  Skin:    General: Skin is warm and dry.  Neurological:     General: No focal deficit present.     Mental Status: Mental status is at baseline.    Labs on Admission: I have personally reviewed following labs and imaging studies  CBC: Recent Labs  Lab 07/08/20 1236  WBC 11.2*  NEUTROABS 9.4*  HGB 11.4*  HCT 36.2  MCV 93.5  PLT 998    Basic Metabolic Panel: Recent Labs  Lab 07/08/20 1236  NA 135  K 4.0  CL 101  CO2 24  GLUCOSE 133*  BUN 24*  CREATININE 1.59*  CALCIUM 9.2    GFR: CrCl cannot be calculated (Unknown ideal weight.).  Liver Function Tests: Recent Labs  Lab 07/08/20 1236  AST 21  ALT 19  ALKPHOS 53  BILITOT 1.5*  PROT 6.6  ALBUMIN 3.5    Urine analysis:    Component Value Date/Time   COLORURINE YELLOW 08/09/2016 [place] 08/09/2016 2344   LABSPEC 1.020 08/09/2016 2344   PHURINE 5.5 08/09/2016 2344   GLUCOSEU >=500 (A) 08/09/2016 2344   HGBUR NEGATIVE 08/09/2016 2344   BILIRUBINUR NEGATIVE 08/09/2016 2344   KETONESUR NEGATIVE 08/09/2016 2344   PROTEINUR NEGATIVE 08/09/2016 2344   UROBILINOGEN 0.2 03/28/2014 2229   NITRITE NEGATIVE 08/09/2016 2344   LEUKOCYTESUR NEGATIVE 08/09/2016 2344    Radiological Exams on Admission: DG Chest 2 View  Result Date: 07/08/2020 CLINICAL DATA:  Shortness of breath with weakness and dizziness. History of COPD and hypertension. EXAM: CHEST - 2 VIEW COMPARISON:  Radiographs 08/03/2019.  CT 05/25/2014. FINDINGS: The heart size and mediastinal contours are stable with aortic atherosclerosis. There is chronic volume loss in the right  upper lobe with stable right apical scarring. The lungs are otherwise clear. There is no pleural effusion or pneumothorax. No acute osseous findings are seen status post right total shoulder arthroplasty. IMPRESSION: Stable chest. No active cardiopulmonary process.  Electronically Signed   By: Richardean Sale M.D.   On: 07/08/2020 18:14   DG Pelvis 1-2 Views  Result Date: 07/08/2020 CLINICAL DATA:  Fall EXAM: PELVIS - 1-2 VIEW COMPARISON:  08/03/2019 FINDINGS: There is no evidence of pelvic fracture or diastasis. Sacrum partially obscured by overlying bowel gas. Mild diffuse osteopenia. IMPRESSION: Negative. Electronically Signed   By: Davina Poke D.O.   On: 07/08/2020 11:17   DG Shoulder Right  Result Date: 07/08/2020 CLINICAL DATA:  Fall, shoulder pain EXAM: RIGHT SHOULDER - 2+ VIEW COMPARISON:  07/16/2010, 08/03/2019, 08/09/2016 FINDINGS: Status post right shoulder hemiarthroplasty. The humeral component is superiorly dislocated relative to the glenoid. This alignment is unchanged from prior x-ray 08/09/2016. There is secondary remodeling of the underlying acromion and distal clavicle. No evidence of a periprosthetic fracture. Bones are demineralized. IMPRESSION: 1. 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 Alignment is unchanged dating back to at least 2018. 2. No evidence of a periprosthetic fracture. Electronically Signed   By: Davina Poke D.O.   On: 07/08/2020 11:21   DG Elbow Complete Left  Result Date: 07/08/2020 CLINICAL DATA:  [place] with elbow pain. EXAM: LEFT ELBOW - COMPLETE 3+ VIEW COMPARISON:  None. FINDINGS: Elbow joint effusion. Fracture of the medial epicondyle of the humerus, displaced proximally about 3 mm. Articular surfaces not involved. IMPRESSION: Medial epicondyle fracture with joint effusion. Electronically Signed   By: Nelson Chimes M.D.   On: 07/08/2020 11:18   CT Head Wo  Contrast  Result Date: 07/08/2020 CLINICAL DATA:  Fall, hit back of head EXAM: CT HEAD WITHOUT CONTRAST TECHNIQUE: Contiguous axial images were obtained from the base of the skull through the vertex without intravenous contrast. COMPARISON:  None. FINDINGS: Brain: There is atrophy and chronic small vessel disease changes. Infarct seen within the right frontal lobe, age indeterminate. No hemorrhage or hydrocephalus. Old right basal ganglia lacunar infarct. Vascular: No hyperdense vessel or unexpected calcification. Skull: No acute calvarial abnormality. Sinuses/Orbits: Visualized paranasal sinuses and mastoids clear. Orbital soft tissues unremarkable. Other: None IMPRESSION: Age indeterminate right frontal infarct. Old right basal ganglia lacunar infarct. Atrophy, chronic small vessel disease. Electronically Signed   By: Rolm Baptise M.D.   On: 07/08/2020 11:55   CT Cervical Spine Wo Contrast  Result Date: 07/08/2020 CLINICAL DATA:  Fall down steps with pain. EXAM: CT CERVICAL, THORACIC, AND LUMBAR SPINE WITHOUT CONTRAST TECHNIQUE: Multidetector CT imaging of the cervical, thoracic and lumbar spine was performed without intravenous contrast. Multiplanar CT image reconstructions were also generated. COMPARISON:  Chest radiographs 08/03/2019. Cervical radiographs 08/03/2019. FINDINGS: CT CERVICAL SPINE FINDINGS Alignment: Mild anterolisthesis of C4 on C5, favored degenerative. Otherwise, no substantial subluxation. Skull base and vertebrae: No acute fracture. Vertebral body heights are maintained. Osteopenia. Soft tissues and spinal canal: No prevertebral fluid or swelling. No visible canal hematoma. Disc levels:  Multilevel facet hypertrophy. CT THORACIC SPINE FINDINGS Alignment: Normal. Vertebrae: There is a remote T3 compression fracture with similar height loss when comparing to prior chest radiograph from 08/03/2019. Mild T1 vertebral body height loss without trabecular sclerosis or discrete lucency.  Osteopenia. Paraspinal and other soft tissues: Emphysema. Masslike consolidation in the right upper lobe. Disc levels: No substantial focal degenerative change. CT LUMBAR SPINE FINDINGS Alignment: Normal. Vertebrae: No acute fracture or focal pathologic process. Osteopenia. Paraspinal and other soft tissues: Calcific atherosclerosis of the aorta and bilateral iliac vessels. Bilateral renal cysts, incompletely characterized. Hypodense right adrenal nodule, compatible  with an adrenal adenoma. Disc levels: Mild degenerative disc disease at multiple levels. Disc bulge at L3-L4 and L4-L5 with likely at least mild canal stenosis. IMPRESSION: 1. Age indeterminate T1 compression fracture with mild height loss, favored remote given lack of trabecular sclerosis or discrete lucency. Correlate with the presence or absence of point tenderness. MRI could better evaluate acuity if clinically indicated. 2. Remote T3 compression fracture with similar height loss. 3. Masslike consolidation in the right upper lobe. Recommend dedicated chest CT to further characterize. Electronically Signed   By: Margaretha Sheffield MD   On: 07/08/2020 12:19   CT Thoracic Spine Wo Contrast  Result Date: 07/08/2020 CLINICAL DATA:  Fall down steps with pain. EXAM: CT CERVICAL, THORACIC, AND LUMBAR SPINE WITHOUT CONTRAST TECHNIQUE: Multidetector CT imaging of the cervical, thoracic and lumbar spine was performed without intravenous contrast. Multiplanar CT image reconstructions were also generated. COMPARISON:  Chest radiographs 08/03/2019. Cervical radiographs 08/03/2019. FINDINGS: CT CERVICAL SPINE FINDINGS Alignment: Mild anterolisthesis of C4 on C5, favored degenerative. Otherwise, no substantial subluxation. Skull base and vertebrae: No acute fracture. Vertebral body heights are maintained. Osteopenia. Soft tissues and spinal canal: No prevertebral fluid or swelling. No visible canal hematoma. Disc levels:  Multilevel facet hypertrophy. CT THORACIC  SPINE FINDINGS Alignment: Normal. Vertebrae: There is a remote T3 compression fracture with similar height loss when comparing to prior chest radiograph from 08/03/2019. Mild T1 vertebral body height loss without trabecular sclerosis or discrete lucency. Osteopenia. Paraspinal and other soft tissues: Emphysema. Masslike consolidation in the right upper lobe. Disc levels: No substantial focal degenerative change. CT LUMBAR SPINE FINDINGS Alignment: Normal. Vertebrae: No acute fracture or focal pathologic process. Osteopenia. Paraspinal and other soft tissues: Calcific atherosclerosis of the aorta and bilateral iliac vessels. Bilateral renal cysts, incompletely characterized. Hypodense right adrenal nodule, compatible with an adrenal adenoma. Disc levels: Mild degenerative disc disease at multiple levels. Disc bulge at L3-L4 and L4-L5 with likely at least mild canal stenosis. IMPRESSION: 1. Age indeterminate T1 compression fracture with mild height loss, favored remote given lack of trabecular sclerosis or discrete lucency. Correlate with the presence or absence of point tenderness. MRI could better evaluate acuity if clinically indicated. 2. Remote T3 compression fracture with similar height loss. 3. Masslike consolidation in the right upper lobe. Recommend dedicated chest CT to further characterize. Electronically Signed   By: Margaretha Sheffield MD   On: 07/08/2020 12:19   CT Lumbar Spine Wo Contrast  Result Date: 07/08/2020 CLINICAL DATA:  Fall down steps with pain. EXAM: CT CERVICAL, THORACIC, AND LUMBAR SPINE WITHOUT CONTRAST TECHNIQUE: Multidetector CT imaging of the cervical, thoracic and lumbar spine was performed without intravenous contrast. Multiplanar CT image reconstructions were also generated. COMPARISON:  Chest radiographs 08/03/2019. Cervical radiographs 08/03/2019. FINDINGS: CT CERVICAL SPINE FINDINGS Alignment: Mild anterolisthesis of C4 on C5, favored degenerative. Otherwise, no substantial  subluxation. Skull base and vertebrae: No acute fracture. Vertebral body heights are maintained. Osteopenia. Soft tissues and spinal canal: No prevertebral fluid or swelling. No visible canal hematoma. Disc levels:  Multilevel facet hypertrophy. CT THORACIC SPINE FINDINGS Alignment: Normal. Vertebrae: There is a remote T3 compression fracture with similar height loss when comparing to prior chest radiograph from 08/03/2019. Mild T1 vertebral body height loss without trabecular sclerosis or discrete lucency. Osteopenia. Paraspinal and other soft tissues: Emphysema. Masslike consolidation in the right upper lobe. Disc levels: No substantial focal degenerative change. CT LUMBAR SPINE FINDINGS Alignment: Normal. Vertebrae: No acute fracture or focal pathologic process. Osteopenia. Paraspinal and  other soft tissues: Calcific atherosclerosis of the aorta and bilateral iliac vessels. Bilateral renal cysts, incompletely characterized. Hypodense right adrenal nodule, compatible with an adrenal adenoma. Disc levels: Mild degenerative disc disease at multiple levels. Disc bulge at L3-L4 and L4-L5 with likely at least mild canal stenosis. IMPRESSION: 1. Age indeterminate T1 compression fracture with mild height loss, favored remote given lack of trabecular sclerosis or discrete lucency. Correlate with the presence or absence of point tenderness. MRI could better evaluate acuity if clinically indicated. 2. Remote T3 compression fracture with similar height loss. 3. Masslike consolidation in the right upper lobe. Recommend dedicated chest CT to further characterize. Electronically Signed   By: Margaretha Sheffield MD   On: 07/08/2020 12:19    EKG: Independently reviewed.  Sinus tachycardia at 110, some baseline wander.  Assessment/Plan Principal Problem:   Acute on chronic respiratory failure with hypoxia (HCC) Active Problems:   COPD (chronic obstructive pulmonary disease) (HCC)   GERD (gastroesophageal reflux disease)    Type 2 diabetes mellitus with complication, with long-term current use of insulin (HCC)   Essential hypertension, benign   Right colon cancer (Stage I) s/p partial colectomy 01/12/14   Thrush, oral   Esophageal stricture  COPD Acute on chronic respiratory failure with hypoxia > Patient noted to be hypoxic to the 80s on ambulation in ED > In the setting of not being able to take her home medications for the past day - We will restart home medications and as needed albuterol nebs - Supplemental O2 as needed  Fall Left medial condyle fracture > Mechanical fall at home found to have medial condyle fracture > Ortho consulted in ED and have placed a sling and plan see outpatient -Continue to monitor  AKI > Creatinine 1.59 from baseline of 1.2 > In the setting of recent UTI undergoing treatment - We will provide gentle IV fluids overnight - Trend renal function  Leukocytosis Recent UTI diagnosis > WBC 11.2 - Continue home Cipro  Diabetes > No meds in chart - SSI  GERD Thrush Esophageal stricture > Continue home nystatin  Hx of right colon cancer status post partial colectomy in 2015  DVT prophylaxis: Lovenox Code Status:   Full  Family Communication:  Daughter updated at bedside  Disposition Plan:   Patient is from:  Home  Anticipated DC to:  Home  Anticipated DC date:  12/6  Anticipated DC barriers: None  Consults called:  None  Admission status:  Observation, telemetry  Severity of Illness: The appropriate patient status for this patient is OBSERVATION. Observation status is judged to be reasonable and necessary in order to provide the required intensity of service to ensure the patient's safety. The patient's presenting symptoms, physical exam findings, and initial radiographic and laboratory data in the context of their medical condition is felt to place them at decreased risk for further clinical deterioration. Furthermore, it is anticipated that the patient will be  medically stable for discharge from the hospital within 2 midnights of admission. The following factors support the patient status of observation.   " The patient's presenting symptoms include fall, hypoxia. " The physical exam findings include stable exam findings. " The initial radiographic and laboratory data are hypoxia with ambulation.  Creatinine elevated to 1.59 from baseline of 1.2, leukocytosis of 1.2..      Marcelyn Bruins MD [facility]  How to contact the [facility] Attending or Consulting provider [place] or covering provider during after hours [place], for this patient?  1. Check the care team in [facility] and look for a) attending/consulting [facility] provider listed and b) the [facility] team listed 2. Log into [email or web address] and use [place]'s universal password to access. If you do not have the password, please contact the hospital operator. 3. Locate the [facility] provider you are looking for under [facility] and page to a number that you can be directly reached. 4. If you still have difficulty reaching the provider, please page the [facility] (Director on Call) for the Hospitalists listed on amion for assistance.  07/08/2020, 9:03 PM

## 2020-07-08 NOTE — Consult Note (Signed)
Reason for Consult:Left elbow fx Referring Physician: R Shizuye [name] is an 79 y.o. female.  HPI: Amanda [name] was going up the garage steps with some groceries when she lost her balance and fell backwards onto the concrete. She had upper extremity pain and was brought to the ED for evaluation. X-rays showed a left medial condylar elbow fx and orthopedic surgery was consulted.   Past Medical History:  Diagnosis Date  . Arrhythmia    7-23-15x1beginning of colon surgery 01-12-14 - no problems since.  . Blood transfusion without reported diagnosis   . Cancer ([place])    colon cancer 01-12-14- no further tx. intended  . Candida esophagitis ([place]) 01/09/2014  . Cataract   . Closed fracture of unspecified part of upper end of humerus   . Complete rupture of rotator cuff    right shoulder- limited range of motion  . COPD (chronic obstructive pulmonary disease) ([place])   . Emphysema lung ([place])   . Emphysema of lung ([place])   . Esophageal stricture 12/28/2013  . Essential hypertension, benign    off lisinporil for last 2 months  . GERD (gastroesophageal reflux disease)   . HCAP (healthcare-associated pneumonia) 03/28/2014  . Hx of adenomatous colonic polyps 05/15/2015  . Hyperlipidemia   . Neurogenic bladder, NOS    02-22-14 some bladder issues of urgency is somewhat improve.  . Other vitamin B12 deficiency anemia   . Type II or unspecified type diabetes mellitus without mention of complication, uncontrolled    diet controlled  . Vitamin D deficiency     Past Surgical History:  Procedure Laterality Date  . BALLOON DILATION N/A 03/02/2014   Procedure: BALLOON DILATION;  Surgeon: Gatha Mayer, MD;  Location: [facility];  Service: Endoscopy;  Laterality: N/A;  . CATARACT EXTRACTION, BILATERAL Bilateral   . COLON SURGERY    . COLONOSCOPY N/A 01/10/2014   Procedure: COLONOSCOPY;  Surgeon: [name], MD;  Location: [facility];  Service: Endoscopy;  Laterality: N/A;  . DILATION AND CURETTAGE  OF UTERUS    . ESOPHAGOGASTRODUODENOSCOPY N/A 01/16/2014   Procedure: ESOPHAGOGASTRODUODENOSCOPY (EGD);  Surgeon: Gatha Mayer, MD;  Location: [facility];  Service: Endoscopy;  Laterality: N/A;  . ESOPHAGOGASTRODUODENOSCOPY N/A 03/02/2014   Procedure: ESOPHAGOGASTRODUODENOSCOPY (EGD);  Surgeon: Gatha Mayer, MD;  Location: [facility];  Service: Endoscopy;  Laterality: N/A;  . ESOPHAGOGASTRODUODENOSCOPY N/A 08/21/2015   Procedure: ESOPHAGOGASTRODUODENOSCOPY (EGD);  Surgeon: Gatha Mayer, MD;  Location: [facility];  Service: Endoscopy;  Laterality: N/A;  . ESOPHAGOGASTRODUODENOSCOPY (EGD) WITH PROPOFOL N/A 01/09/2014   Procedure: ESOPHAGOGASTRODUODENOSCOPY (EGD) WITH PROPOFOL;  Surgeon: [name], MD;  [facility];  Service: Endoscopy;  Laterality: N/A;  . ESOPHAGOGASTRODUODENOSCOPY (EGD) WITH PROPOFOL N/A 01/19/2014   Procedure: ESOPHAGOGASTRODUODENOSCOPY (EGD) WITH PROPOFOL;  Surgeon: Gatha Mayer, MD;  Location: [facility];  Service: Endoscopy;  Laterality: N/A;  . EYE SURGERY Bilateral 2010   both eyes lens replacments  . LAPAROSCOPIC PARTIAL COLECTOMY N/A 01/12/2014   Procedure: LAPAROSCOPIC ASSISTED PARTIAL COLECTOMY AND REMOVAL OF RECTAL POLYP;  Surgeon: Odis Hollingshead, MD;  Location: [facility];  Service: General;  Laterality: N/A;  . ORIF SHOULDER FRACTURE Right 2011   arthroplasty  . SAVORY DILATION N/A 08/21/2015   Procedure: SAVORY DILATION;  Surgeon: Gatha Mayer, MD;  Location: [facility];  Service: Endoscopy;  Laterality: N/A;  . TONSILLECTOMY AND ADENOIDECTOMY  age 62 or 21    Family History  Problem Relation Age of Onset  . Diabetes Father   .  Bone cancer Father        bone marrow  . Emphysema Mother   . Pneumonia Mother   . Colon cancer Neg Hx   . Esophageal cancer Neg Hx   . Stomach cancer Neg Hx   . Rectal cancer Neg Hx     Social History:  reports that she quit smoking about 14 years ago. Her smoking use included cigarettes. She has a  20.00 pack-year smoking history. She has never used smokeless tobacco. She reports that she does not drink alcohol and does not use drugs.  Allergies: No Known Allergies  Medications: I have reviewed the patient's current medications.  Results for orders placed or performed during the hospital encounter of 07/08/20 (from the past 48 hour(s))  CBC with Differential/Platelet     Status: Abnormal   Collection Time: 07/08/20 12:36 PM  Result Value Ref Range   WBC 11.2 (H) 4.0 - 10.5 K/uL   RBC 3.87 3.87 - 5.11 MIL/uL   Hemoglobin 11.4 (L) 12.0 - 15.0 g/dL   HCT 36.2 36 - 46 %   MCV 93.5 80.0 - 100.0 fL   MCH 29.5 26.0 - 34.0 pg   MCHC 31.5 30.0 - 36.0 g/dL   RDW 12.4 11.5 - 15.5 %   Platelets 267 150 - 400 K/uL   nRBC 0.0 0.0 - 0.2 %   Neutrophils Relative % 85 %   Neutro Abs 9.4 (H) 1.7 - 7.7 K/uL   Lymphocytes Relative 7 %   Lymphs Abs 0.8 0.7 - 4.0 K/uL   Monocytes Relative 6 %   Monocytes Absolute 0.7 0.1 - 1.0 K/uL   Eosinophils Relative 1 %   Eosinophils Absolute 0.1 0.0 - 0.5 K/uL   Basophils Relative 1 %   Basophils Absolute 0.1 0.0 - 0.1 K/uL   Immature Granulocytes 0 %   Abs Immature Granulocytes 0.04 0.00 - 0.07 K/uL    Comment: Performed at [facility], [address], [place], [place] [address]  Comprehensive metabolic panel     Status: Abnormal   Collection Time: 07/08/20 12:36 PM  Result Value Ref Range   Sodium 135 135 - 145 mmol/L   Potassium 4.0 3.5 - 5.1 mmol/L   Chloride 101 98 - 111 mmol/L   CO2 24 22 - 32 mmol/L   Glucose, Bld 133 (H) 70 - 99 mg/dL    Comment: Glucose reference range applies only to samples taken after fasting for at least 8 hours.   BUN 24 (H) 8 - 23 mg/dL   Creatinine, Ser 1.59 (H) 0.44 - 1.00 mg/dL   Calcium 9.2 8.9 - 10.3 mg/dL   Total Protein 6.6 6.5 - 8.1 g/dL   Albumin 3.5 3.5 - 5.0 g/dL   AST 21 15 - 41 U/L   ALT 19 0 - 44 U/L   Alkaline Phosphatase 53 38 - 126 U/L   Total Bilirubin 1.5 (H) 0.3 - 1.2 mg/dL   GFR,  Estimated 32 (L) >60 mL/min    Comment: (NOTE) Calculated using the CKD-EPI Creatinine Equation (2021)    Anion gap 10 5 - 15    Comment: Performed at [facility] [address] [place], [place] [address]  CK     Status: None   Collection Time: 07/08/20 12:36 PM  Result Value Ref Range   Total CK 101 38.0 - 234.0 U/L    Comment: Performed at [facility], [place] [address], [place],  [address]  DG Pelvis 1-2 Views  Result Date: 07/08/2020 CLINICAL DATA:  Fall EXAM: PELVIS - 1-2 VIEW COMPARISON:  08/03/2019 FINDINGS: There is no evidence of pelvic fracture or diastasis. Sacrum partially obscured by overlying bowel gas. Mild diffuse osteopenia. IMPRESSION: Negative. Electronically Signed   By: Davina Poke D.O.   On: 07/08/2020 11:17   DG Shoulder Right  Result Date: 07/08/2020 CLINICAL DATA:  Fall, shoulder pain EXAM: RIGHT SHOULDER - 2+ VIEW COMPARISON:  07/16/2010, 08/03/2019, 08/09/2016 FINDINGS: Status post right shoulder hemiarthroplasty. The humeral component is superiorly dislocated relative to the glenoid. This alignment is unchanged from prior x-ray 08/09/2016. There is secondary remodeling of the underlying acromion and distal clavicle. No evidence of a periprosthetic fracture. Bones are demineralized. IMPRESSION: 1. 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 Alignment is unchanged dating back to at least 2018. 2. No evidence of a periprosthetic fracture. Electronically Signed   By: Davina Poke D.O.   On: 07/08/2020 11:21   DG Elbow Complete Left  Result Date: 07/08/2020 CLINICAL DATA:  [place] with elbow pain. EXAM: LEFT ELBOW - COMPLETE 3+ VIEW COMPARISON:  None. FINDINGS: Elbow joint effusion. Fracture of the medial epicondyle of the humerus, displaced proximally about 3 mm. Articular surfaces not involved. IMPRESSION: Medial epicondyle fracture with joint effusion.  Electronically Signed   By: Nelson Chimes M.D.   On: 07/08/2020 11:18   CT Head Wo Contrast  Result Date: 07/08/2020 CLINICAL DATA:  Fall, hit back of head EXAM: CT HEAD WITHOUT CONTRAST TECHNIQUE: Contiguous axial images were obtained from the base of the skull through the vertex without intravenous contrast. COMPARISON:  None. FINDINGS: Brain: There is atrophy and chronic small vessel disease changes. Infarct seen within the right frontal lobe, age indeterminate. No hemorrhage or hydrocephalus. Old right basal ganglia lacunar infarct. Vascular: No hyperdense vessel or unexpected calcification. Skull: No acute calvarial abnormality. Sinuses/Orbits: Visualized paranasal sinuses and mastoids clear. Orbital soft tissues unremarkable. Other: None IMPRESSION: Age indeterminate right frontal infarct. Old right basal ganglia lacunar infarct. Atrophy, chronic small vessel disease. Electronically Signed   By: Rolm Baptise M.D.   On: 07/08/2020 11:55   CT Cervical Spine Wo Contrast  Result Date: 07/08/2020 CLINICAL DATA:  Fall down steps with pain. EXAM: CT CERVICAL, THORACIC, AND LUMBAR SPINE WITHOUT CONTRAST TECHNIQUE: Multidetector CT imaging of the cervical, thoracic and lumbar spine was performed without intravenous contrast. Multiplanar CT image reconstructions were also generated. COMPARISON:  Chest radiographs 08/03/2019. Cervical radiographs 08/03/2019. FINDINGS: CT CERVICAL SPINE FINDINGS Alignment: Mild anterolisthesis of C4 on C5, favored degenerative. Otherwise, no substantial subluxation. Skull base and vertebrae: No acute fracture. Vertebral body heights are maintained. Osteopenia. Soft tissues and spinal canal: No prevertebral fluid or swelling. No visible canal hematoma. Disc levels:  Multilevel facet hypertrophy. CT THORACIC SPINE FINDINGS Alignment: Normal. Vertebrae: There is a remote T3 compression fracture with similar height loss when comparing to prior chest radiograph from 08/03/2019. Mild T1  vertebral body height loss without trabecular sclerosis or discrete lucency. Osteopenia. Paraspinal and other soft tissues: Emphysema. Masslike consolidation in the right upper lobe. Disc levels: No substantial focal degenerative change. CT LUMBAR SPINE FINDINGS Alignment: Normal. Vertebrae: No acute fracture or focal pathologic process. Osteopenia. Paraspinal and other soft tissues: Calcific atherosclerosis of the aorta and bilateral iliac vessels. Bilateral renal cysts, incompletely characterized. Hypodense right adrenal nodule, compatible with an adrenal adenoma. Disc levels: Mild degenerative disc disease at multiple levels. Disc bulge at  L3-L4 and L4-L5 with likely at least mild canal stenosis. IMPRESSION: 1. Age indeterminate T1 compression fracture with mild height loss, favored remote given lack of trabecular sclerosis or discrete lucency. Correlate with the presence or absence of point tenderness. MRI could better evaluate acuity if clinically indicated. 2. Remote T3 compression fracture with similar height loss. 3. Masslike consolidation in the right upper lobe. Recommend dedicated chest CT to further characterize. Electronically Signed   By: Margaretha Sheffield MD   On: 07/08/2020 12:19   CT Thoracic Spine Wo Contrast  Result Date: 07/08/2020 CLINICAL DATA:  Fall down steps with pain. EXAM: CT CERVICAL, THORACIC, AND LUMBAR SPINE WITHOUT CONTRAST TECHNIQUE: Multidetector CT imaging of the cervical, thoracic and lumbar spine was performed without intravenous contrast. Multiplanar CT image reconstructions were also generated. COMPARISON:  Chest radiographs 08/03/2019. Cervical radiographs 08/03/2019. FINDINGS: CT CERVICAL SPINE FINDINGS Alignment: Mild anterolisthesis of C4 on C5, favored degenerative. Otherwise, no substantial subluxation. Skull base and vertebrae: No acute fracture. Vertebral body heights are maintained. Osteopenia. Soft tissues and spinal canal: No prevertebral fluid or swelling. No  visible canal hematoma. Disc levels:  Multilevel facet hypertrophy. CT THORACIC SPINE FINDINGS Alignment: Normal. Vertebrae: There is a remote T3 compression fracture with similar height loss when comparing to prior chest radiograph from 08/03/2019. Mild T1 vertebral body height loss without trabecular sclerosis or discrete lucency. Osteopenia. Paraspinal and other soft tissues: Emphysema. Masslike consolidation in the right upper lobe. Disc levels: No substantial focal degenerative change. CT LUMBAR SPINE FINDINGS Alignment: Normal. Vertebrae: No acute fracture or focal pathologic process. Osteopenia. Paraspinal and other soft tissues: Calcific atherosclerosis of the aorta and bilateral iliac vessels. Bilateral renal cysts, incompletely characterized. Hypodense right adrenal nodule, compatible with an adrenal adenoma. Disc levels: Mild degenerative disc disease at multiple levels. Disc bulge at L3-L4 and L4-L5 with likely at least mild canal stenosis. IMPRESSION: 1. Age indeterminate T1 compression fracture with mild height loss, favored remote given lack of trabecular sclerosis or discrete lucency. Correlate with the presence or absence of point tenderness. MRI could better evaluate acuity if clinically indicated. 2. Remote T3 compression fracture with similar height loss. 3. Masslike consolidation in the right upper lobe. Recommend dedicated chest CT to further characterize. Electronically Signed   By: Margaretha Sheffield MD   On: 07/08/2020 12:19   CT Lumbar Spine Wo Contrast  Result Date: 07/08/2020 CLINICAL DATA:  Fall down steps with pain. EXAM: CT CERVICAL, THORACIC, AND LUMBAR SPINE WITHOUT CONTRAST TECHNIQUE: Multidetector CT imaging of the cervical, thoracic and lumbar spine was performed without intravenous contrast. Multiplanar CT image reconstructions were also generated. COMPARISON:  Chest radiographs 08/03/2019. Cervical radiographs 08/03/2019. FINDINGS: CT CERVICAL SPINE FINDINGS Alignment: Mild  anterolisthesis of C4 on C5, favored degenerative. Otherwise, no substantial subluxation. Skull base and vertebrae: No acute fracture. Vertebral body heights are maintained. Osteopenia. Soft tissues and spinal canal: No prevertebral fluid or swelling. No visible canal hematoma. Disc levels:  Multilevel facet hypertrophy. CT THORACIC SPINE FINDINGS Alignment: Normal. Vertebrae: There is a remote T3 compression fracture with similar height loss when comparing to prior chest radiograph from 08/03/2019. Mild T1 vertebral body height loss without trabecular sclerosis or discrete lucency. Osteopenia. Paraspinal and other soft tissues: Emphysema. Masslike consolidation in the right upper lobe. Disc levels: No substantial focal degenerative change. CT LUMBAR SPINE FINDINGS Alignment: Normal. Vertebrae: No acute fracture or focal pathologic process. Osteopenia. Paraspinal and other soft tissues: Calcific atherosclerosis of the aorta and bilateral iliac vessels. Bilateral renal cysts, incompletely  characterized. Hypodense right adrenal nodule, compatible with an adrenal adenoma. Disc levels: Mild degenerative disc disease at multiple levels. Disc bulge at L3-L4 and L4-L5 with likely at least mild canal stenosis. IMPRESSION: 1. Age indeterminate T1 compression fracture with mild height loss, favored remote given lack of trabecular sclerosis or discrete lucency. Correlate with the presence or absence of point tenderness. MRI could better evaluate acuity if clinically indicated. 2. Remote T3 compression fracture with similar height loss. 3. Masslike consolidation in the right upper lobe. Recommend dedicated chest CT to further characterize. Electronically Signed   By: Margaretha Sheffield MD   On: 07/08/2020 12:19    Review of Systems  HENT: Negative for ear discharge, ear pain, hearing loss and tinnitus.   Eyes: Negative for photophobia and pain.  Respiratory: Negative for cough and shortness of breath.   Cardiovascular:  Negative for chest pain.  Gastrointestinal: Negative for abdominal pain, nausea and vomiting.  Genitourinary: Negative for dysuria, flank pain, frequency and urgency.  Musculoskeletal: Positive for arthralgias (Left elbow, right shoulder). Negative for back pain, myalgias and neck pain.  Neurological: Negative for dizziness and headaches.  Hematological: Does not bruise/bleed easily.  Psychiatric/Behavioral: The patient is not nervous/anxious.    Blood pressure (!) 160/86, pulse (!) 103, temperature 98.7 F (37.1 C), temperature source Oral, resp. rate 17, SpO2 92 %. Physical Exam Constitutional:      General: She is not in acute distress.    Appearance: She is well-developed. She is not diaphoretic.  HENT:     Head: Normocephalic and atraumatic.  Eyes:     General: No scleral icterus.       Right eye: No discharge.        Left eye: No discharge.     Conjunctiva/sclera: Conjunctivae normal.  Cardiovascular:     Rate and Rhythm: Normal rate and regular rhythm.  Pulmonary:     Effort: Pulmonary effort is normal. No respiratory distress.  Musculoskeletal:     Cervical back: Normal range of motion.     Comments: Left shoulder, elbow, wrist, digits- no skin wounds, TTP elbow, no instability, no blocks to motion  Sens  Ax/R/M/U intact  Mot   Ax/ R/ PIN/ M/ AIN/ U intact  Rad 2+  Skin:    General: Skin is warm and dry.  Neurological:     Mental Status: She is alert.  Psychiatric:        Behavior: Behavior normal.     Assessment/Plan: Left elbow fx -- Minimally displaced, should do well without surgery. Will place in a sling and keep NWB. F/u with Dr. Percell Miller next week.    Lisette Abu, PA-C Orthopedic Surgery [phone] 07/08/2020, 1:57 PM

## 2020-07-08 NOTE — ED Notes (Signed)
Pt transported to X-ray and CT scan

## 2020-07-08 NOTE — Discharge Instructions (Signed)
Please read and follow all provided instructions.  Your diagnoses today include:  1. Closed displaced fracture of medial condyle of left humerus, initial encounter   2. Pulmonary mass   3. Cerebrovascular accident (CVA), unspecified mechanism ([place])     Tests performed today include:  CT of the brain - shows possible old stroke. You need to tell your doctor about this so they can manage your risk factors for stroke.  CT of the neck and back - shows old compression fractures, no new fractures. It also shows a concerning spot in the right upper lung that will need to be evaluated by your doctor.   X-ray of your left elbow - shows a broken humerus bone, follow-up with Dr. Percell Miller for this problem  X-ray of your pelvis and right shoulder - no new injuries Blood cell counts (white, red, and platelets) Electrolytes  Kidney function test Urine test to check for infection    Vital signs. See below for your results today.   Medications prescribed:   Take any prescribed medications only as directed.  Home care instructions:  Follow any educational materials contained in this packet.  BE VERY CAREFUL not to take multiple medicines containing Tylenol (also called acetaminophen). Doing so can lead to an overdose which can damage your liver and cause liver failure and possibly death.   Follow-up instructions: Please follow-up with your primary care provider in the next 3 days for further evaluation of your symptoms.   Return instructions:   Please return to the Emergency Department if you experience worsening symptoms.   Please return if you have any other emergent concerns.  Additional Information:  Your vital signs today were: BP (!) 160/86   Pulse (!) 103   Temp 98.7 F (37.1 C) (Oral)   Resp 17   SpO2 92%  If your blood pressure (BP) was elevated above 135/85 this visit, please have this repeated by your doctor within one month. --------------

## 2020-07-08 NOTE — ED Provider Notes (Signed)
I assumed care of patient at shift change from previous team, please see their note for full H&P.  Briefly patient is here for evaluation after a fall.  She was on the ground for about 16 hours.  Physical Exam  BP (!) 160/86   Pulse (!) 103   Temp 98.7 F (37.1 C) (Oral)   Resp 17   SpO2 92%     DG Pelvis 1-2 Views  Result Date: 07/08/2020 CLINICAL DATA:  Fall EXAM: PELVIS - 1-2 VIEW COMPARISON:  08/03/2019 FINDINGS: There is no evidence of pelvic fracture or diastasis. Sacrum partially obscured by overlying bowel gas. Mild diffuse osteopenia. IMPRESSION: Negative. Electronically Signed   By: Davina Poke D.O.   On: 07/08/2020 11:17   DG Shoulder Right  Result Date: 07/08/2020 CLINICAL DATA:  Fall, shoulder pain EXAM: RIGHT SHOULDER - 2+ VIEW COMPARISON:  07/16/2010, 08/03/2019, 08/09/2016 FINDINGS: Status post right shoulder hemiarthroplasty. The humeral component is superiorly dislocated relative to the glenoid. This alignment is unchanged from prior x-ray 08/09/2016. There is secondary remodeling of the underlying acromion and distal clavicle. No evidence of a periprosthetic fracture. Bones are demineralized. IMPRESSION: 1. 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 Alignment is unchanged dating back to at least 2018. 2. No evidence of a periprosthetic fracture. Electronically Signed   By: Davina Poke D.O.   On: 07/08/2020 11:21   DG Elbow Complete Left  Result Date: 07/08/2020 CLINICAL DATA:  [place] with elbow pain. EXAM: LEFT ELBOW - COMPLETE 3+ VIEW COMPARISON:  None. FINDINGS: Elbow joint effusion. Fracture of the medial epicondyle of the humerus, displaced proximally about 3 mm. Articular surfaces not involved. IMPRESSION: Medial epicondyle fracture with joint effusion. Electronically Signed   By: Nelson Chimes M.D.   On: 07/08/2020 11:18   CT Head Wo Contrast  Result Date: 07/08/2020 CLINICAL  DATA:  Fall, hit back of head EXAM: CT HEAD WITHOUT CONTRAST TECHNIQUE: Contiguous axial images were obtained from the base of the skull through the vertex without intravenous contrast. COMPARISON:  None. FINDINGS: Brain: There is atrophy and chronic small vessel disease changes. Infarct seen within the right frontal lobe, age indeterminate. No hemorrhage or hydrocephalus. Old right basal ganglia lacunar infarct. Vascular: No hyperdense vessel or unexpected calcification. Skull: No acute calvarial abnormality. Sinuses/Orbits: Visualized paranasal sinuses and mastoids clear. Orbital soft tissues unremarkable. Other: None IMPRESSION: Age indeterminate right frontal infarct. Old right basal ganglia lacunar infarct. Atrophy, chronic small vessel disease. Electronically Signed   By: Rolm Baptise M.D.   On: 07/08/2020 11:55   CT Cervical Spine Wo Contrast  Result Date: 07/08/2020 CLINICAL DATA:  Fall down steps with pain. EXAM: CT CERVICAL, THORACIC, AND LUMBAR SPINE WITHOUT CONTRAST TECHNIQUE: Multidetector CT imaging of the cervical, thoracic and lumbar spine was performed without intravenous contrast. Multiplanar CT image reconstructions were also generated. COMPARISON:  Chest radiographs 08/03/2019. Cervical radiographs 08/03/2019. FINDINGS: CT CERVICAL SPINE FINDINGS Alignment: Mild anterolisthesis of C4 on C5, favored degenerative. Otherwise, no substantial subluxation. Skull base and vertebrae: No acute fracture. Vertebral body heights are maintained. Osteopenia. Soft tissues and spinal canal: No prevertebral fluid or swelling. No visible canal hematoma. Disc levels:  Multilevel facet hypertrophy. CT THORACIC SPINE FINDINGS Alignment: Normal. Vertebrae: There is a remote T3 compression fracture with similar height loss when comparing to prior chest radiograph from 08/03/2019. Mild T1 vertebral body height loss without trabecular sclerosis or discrete lucency. Osteopenia. Paraspinal and other soft tissues:  Emphysema. Masslike consolidation in the right upper lobe. Disc levels: No substantial focal degenerative change. CT LUMBAR SPINE FINDINGS Alignment: Normal. Vertebrae: No acute fracture or focal pathologic process. Osteopenia. Paraspinal and other soft tissues: Calcific atherosclerosis of the aorta and bilateral iliac vessels. Bilateral renal cysts, incompletely characterized. Hypodense right adrenal nodule, compatible with an adrenal adenoma. Disc levels: Mild degenerative disc disease at multiple levels. Disc bulge at L3-L4 and L4-L5 with likely at least mild canal stenosis. IMPRESSION: 1. Age indeterminate T1 compression fracture with mild height loss, favored remote given lack of trabecular sclerosis or discrete lucency. Correlate with the presence or absence of point tenderness. MRI could better evaluate acuity if clinically indicated. 2. Remote T3 compression fracture with similar height loss. 3. Masslike consolidation in the right upper lobe. Recommend dedicated chest CT to further characterize. Electronically Signed   By: Margaretha Sheffield MD   On: 07/08/2020 12:19   CT Thoracic Spine Wo Contrast  Result Date: 07/08/2020 CLINICAL DATA:  Fall down steps with pain. EXAM: CT CERVICAL, THORACIC, AND LUMBAR SPINE WITHOUT CONTRAST TECHNIQUE: Multidetector CT imaging of the cervical, thoracic and lumbar spine was performed without intravenous contrast. Multiplanar CT image reconstructions were also generated. COMPARISON:  Chest radiographs 08/03/2019. Cervical radiographs 08/03/2019. FINDINGS: CT CERVICAL SPINE FINDINGS Alignment: Mild anterolisthesis of C4 on C5, favored degenerative. Otherwise, no substantial subluxation. Skull base and vertebrae: No acute fracture. Vertebral body heights are maintained. Osteopenia. Soft tissues and spinal canal: No prevertebral fluid or swelling. No visible canal hematoma. Disc levels:  Multilevel facet hypertrophy. CT THORACIC SPINE FINDINGS Alignment: Normal. Vertebrae:  There is a remote T3 compression fracture with similar height loss when comparing to prior chest radiograph from 08/03/2019. Mild T1 vertebral body height loss without trabecular sclerosis or discrete lucency. Osteopenia. Paraspinal and other soft tissues: Emphysema. Masslike consolidation in the right upper lobe. Disc levels: No substantial focal degenerative change. CT LUMBAR SPINE FINDINGS Alignment: Normal. Vertebrae: No acute fracture or focal pathologic process. Osteopenia. Paraspinal and other soft tissues: Calcific atherosclerosis of the aorta and bilateral iliac vessels. Bilateral renal cysts, incompletely characterized. Hypodense right adrenal nodule, compatible with an adrenal adenoma. Disc levels: Mild degenerative disc disease at multiple levels. Disc bulge at L3-L4 and L4-L5 with likely at least mild canal stenosis. IMPRESSION: 1. Age indeterminate T1 compression fracture with mild height loss, favored remote given lack of trabecular sclerosis or discrete lucency. Correlate with the presence or absence of point tenderness. MRI could better evaluate acuity if clinically indicated. 2. Remote T3 compression fracture with similar height loss. 3. Masslike consolidation in the right upper lobe. Recommend dedicated chest CT to further characterize. Electronically Signed   By: Margaretha Sheffield MD   On: 07/08/2020 12:19   CT Lumbar Spine Wo Contrast  Result Date: 07/08/2020 CLINICAL DATA:  Fall down steps with pain. EXAM: CT CERVICAL, THORACIC, AND LUMBAR SPINE WITHOUT CONTRAST TECHNIQUE: Multidetector CT imaging of the cervical, thoracic and lumbar spine was performed without intravenous contrast. Multiplanar CT image reconstructions were also generated. COMPARISON:  Chest radiographs 08/03/2019. Cervical radiographs 08/03/2019. FINDINGS: CT CERVICAL SPINE FINDINGS Alignment: Mild anterolisthesis of C4 on C5, favored degenerative. Otherwise, no substantial subluxation. Skull base and vertebrae: No acute  fracture. Vertebral body heights are maintained. Osteopenia. Soft tissues and spinal canal: No prevertebral fluid or swelling. No visible canal hematoma. Disc levels:  Multilevel facet hypertrophy. CT THORACIC SPINE FINDINGS Alignment: Normal. Vertebrae: There is a remote T3 compression fracture with similar height loss when comparing to prior  chest radiograph from 08/03/2019. Mild T1 vertebral body height loss without trabecular sclerosis or discrete lucency. Osteopenia. Paraspinal and other soft tissues: Emphysema. Masslike consolidation in the right upper lobe. Disc levels: No substantial focal degenerative change. CT LUMBAR SPINE FINDINGS Alignment: Normal. Vertebrae: No acute fracture or focal pathologic process. Osteopenia. Paraspinal and other soft tissues: Calcific atherosclerosis of the aorta and bilateral iliac vessels. Bilateral renal cysts, incompletely characterized. Hypodense right adrenal nodule, compatible with an adrenal adenoma. Disc levels: Mild degenerative disc disease at multiple levels. Disc bulge at L3-L4 and L4-L5 with likely at least mild canal stenosis. IMPRESSION: 1. Age indeterminate T1 compression fracture with mild height loss, favored remote given lack of trabecular sclerosis or discrete lucency. Correlate with the presence or absence of point tenderness. MRI could better evaluate acuity if clinically indicated. 2. Remote T3 compression fracture with similar height loss. 3. Masslike consolidation in the right upper lobe. Recommend dedicated chest CT to further characterize. Electronically Signed   By: Margaretha Sheffield MD   On: 07/08/2020 12:19     MDM  At this point labs have resulted.  She has a fracture elbow and Ortho is already involved.  Previous team reportedly discussed results with patient and daughter.  Plan is to follow-up on UA, ensure the patient can safely ambulate anticipating discharge home.  1717: Patient attempted to ambulate in hall.  Even with walker she  got very short of breath and was unable to safely ambulate to the bathroom and felt weak.  She states she has not had her trilogy inhaler in 2 days.  Obvious wheezing.  Albuterol as ordered.  Plan for admit.  I spoke with Hospitalist who will see patient for admission.   Note: Portions of this report may have been transcribed using voice recognition software. Every effort was made to ensure accuracy; however, inadvertent computerized transcription errors may be present      Ollen Gross 07/08/20 2314    Tegeler, Gwenyth Allegra, MD 07/08/20 [phone]

## 2020-07-08 NOTE — Progress Notes (Signed)
Orthopedic Tech Progress Note Patient Details:  Amanda [name] March 27, 1941 [id number]  Ortho Devices Type of Ortho Device: Arm sling Ortho Device/Splint Location: LUE   Post Interventions Patient Tolerated: Well Instructions Provided: Care of device   Braulio Bosch 07/08/2020, 5:58 PM

## 2020-07-08 NOTE — Plan of Care (Signed)

## 2020-07-08 NOTE — ED Triage Notes (Signed)
Pt arrived by EMS from home where she lives alone. Pt had a fall about 16hrs coming up the stairs into the house from her garage.  Pt fell backwards down 2-3 stairs landing on her back, pt was able to crawl into her kitchen. Unable to get up or call for help. This morning her son came to check on her and found her on the floor alert and oriented. He called medics.   Pt complaining of lower back pain, left elbow pain and right leg pain. 0/10 when laying still. Pain increases with movement  Bruising noted on left elbow  Redness noted on right hip, pelvis is stable  Able to move all extremities, pulses present A&Ox4

## 2020-07-09 ENCOUNTER — Observation Stay (HOSPITAL_COMMUNITY): Payer: Medicare HMO

## 2020-07-09 DIAGNOSIS — Z85038 Personal history of other malignant neoplasm of large intestine: Secondary | ICD-10-CM | POA: Diagnosis not present

## 2020-07-09 DIAGNOSIS — N179 Acute kidney failure, unspecified: Secondary | ICD-10-CM | POA: Diagnosis present

## 2020-07-09 DIAGNOSIS — J9601 Acute respiratory failure with hypoxia: Secondary | ICD-10-CM | POA: Diagnosis present

## 2020-07-09 DIAGNOSIS — W109XXA Fall (on) (from) unspecified stairs and steps, initial encounter: Secondary | ICD-10-CM | POA: Diagnosis present

## 2020-07-09 DIAGNOSIS — R918 Other nonspecific abnormal finding of lung field: Secondary | ICD-10-CM | POA: Diagnosis present

## 2020-07-09 DIAGNOSIS — J439 Emphysema, unspecified: Secondary | ICD-10-CM | POA: Diagnosis present

## 2020-07-09 DIAGNOSIS — Z794 Long term (current) use of insulin: Secondary | ICD-10-CM | POA: Diagnosis not present

## 2020-07-09 DIAGNOSIS — I10 Essential (primary) hypertension: Secondary | ICD-10-CM | POA: Diagnosis present

## 2020-07-09 DIAGNOSIS — Z825 Family history of asthma and other chronic lower respiratory diseases: Secondary | ICD-10-CM | POA: Diagnosis not present

## 2020-07-09 DIAGNOSIS — Z20822 Contact with and (suspected) exposure to covid-19: Secondary | ICD-10-CM | POA: Diagnosis present

## 2020-07-09 DIAGNOSIS — K222 Esophageal obstruction: Secondary | ICD-10-CM | POA: Diagnosis present

## 2020-07-09 DIAGNOSIS — Z833 Family history of diabetes mellitus: Secondary | ICD-10-CM | POA: Diagnosis not present

## 2020-07-09 DIAGNOSIS — N390 Urinary tract infection, site not specified: Secondary | ICD-10-CM | POA: Diagnosis present

## 2020-07-09 DIAGNOSIS — J449 Chronic obstructive pulmonary disease, unspecified: Secondary | ICD-10-CM | POA: Diagnosis present

## 2020-07-09 DIAGNOSIS — S42462A Displaced fracture of medial condyle of left humerus, initial encounter for closed fracture: Secondary | ICD-10-CM | POA: Diagnosis present

## 2020-07-09 DIAGNOSIS — E119 Type 2 diabetes mellitus without complications: Secondary | ICD-10-CM | POA: Diagnosis present

## 2020-07-09 DIAGNOSIS — B370 Candidal stomatitis: Secondary | ICD-10-CM | POA: Diagnosis present

## 2020-07-09 DIAGNOSIS — Z7982 Long term (current) use of aspirin: Secondary | ICD-10-CM | POA: Diagnosis not present

## 2020-07-09 DIAGNOSIS — E785 Hyperlipidemia, unspecified: Secondary | ICD-10-CM | POA: Diagnosis present

## 2020-07-09 DIAGNOSIS — Z87891 Personal history of nicotine dependence: Secondary | ICD-10-CM | POA: Diagnosis not present

## 2020-07-09 DIAGNOSIS — N319 Neuromuscular dysfunction of bladder, unspecified: Secondary | ICD-10-CM | POA: Diagnosis present

## 2020-07-09 DIAGNOSIS — J9621 Acute and chronic respiratory failure with hypoxia: Secondary | ICD-10-CM | POA: Diagnosis not present

## 2020-07-09 DIAGNOSIS — K219 Gastro-esophageal reflux disease without esophagitis: Secondary | ICD-10-CM | POA: Diagnosis present

## 2020-07-09 DIAGNOSIS — Z9049 Acquired absence of other specified parts of digestive tract: Secondary | ICD-10-CM | POA: Diagnosis not present

## 2020-07-09 DIAGNOSIS — E559 Vitamin D deficiency, unspecified: Secondary | ICD-10-CM | POA: Diagnosis present

## 2020-07-09 DIAGNOSIS — I4891 Unspecified atrial fibrillation: Secondary | ICD-10-CM | POA: Diagnosis present

## 2020-07-09 LAB — BASIC METABOLIC PANEL
Anion gap: 8 (ref 5–15)
BUN: 30 mg/dL — ABNORMAL HIGH (ref 8–23)
CO2: 24 mmol/L (ref 22–32)
Calcium: 8.4 mg/dL — ABNORMAL LOW (ref 8.9–10.3)
Chloride: 105 mmol/L (ref 98–111)
Creatinine, Ser: 1.52 mg/dL — ABNORMAL HIGH (ref 0.44–1.00)
GFR, Estimated: 34 mL/min — ABNORMAL LOW (ref >60)
Glucose, Bld: 148 mg/dL — ABNORMAL HIGH (ref 70–99)
Potassium: 3.8 mmol/L (ref 3.5–5.1)
Sodium: 137 mmol/L (ref 135–145)

## 2020-07-09 LAB — GLUCOSE, CAPILLARY
Glucose-Capillary: 110 mg/dL — ABNORMAL HIGH (ref 70–99)
Glucose-Capillary: 193 mg/dL — ABNORMAL HIGH (ref 70–99)
Glucose-Capillary: 124 mg/dL — ABNORMAL HIGH (ref 70–99)
Glucose-Capillary: 153 mg/dL — ABNORMAL HIGH (ref 70–99)

## 2020-07-09 LAB — D-DIMER, QUANTITATIVE: D-Dimer, Quant: 3.57 ug/mL-FEU — ABNORMAL HIGH (ref 0.00–0.50)

## 2020-07-09 LAB — CBC
HCT: 29.6 % — ABNORMAL LOW (ref 36.0–46.0)
Hemoglobin: 9.5 g/dL — ABNORMAL LOW (ref 12.0–15.0)
MCH: 29.4 pg (ref 26.0–34.0)
MCHC: 32.1 g/dL (ref 30.0–36.0)
MCV: 91.6 fL (ref 80.0–100.0)
Platelets: 197 10*3/uL (ref 150–400)
RBC: 3.23 MIL/uL — ABNORMAL LOW (ref 3.87–5.11)
RDW: 12.3 % (ref 11.5–15.5)
WBC: 8.4 10*3/uL (ref 4.0–10.5)
nRBC: 0 % (ref 0.0–0.2)

## 2020-07-09 MED ORDER — TECHNETIUM TO 99M ALBUMIN AGGREGATED
4.4000 | Freq: Once | INTRAVENOUS | Status: AC | PRN
  Administered 2020-07-09: 4.4 via INTRAVENOUS

## 2020-07-09 MED ORDER — SODIUM CHLORIDE 0.9 % IV SOLN: INTRAVENOUS | Status: DC

## 2020-07-09 NOTE — Progress Notes (Addendum)
PROGRESS NOTE    Amanda [name]  ZOX:[id number] DOB: 06-Oct-1940 DOA: 07/08/2020 PCP: Timoteo Gaul, FNP     Brief Narrative:  Amanda [name] is an 79 y.o. female with medical history significant of A. fib, COPD, esophageal stricture, GERD, hyperlipidemia, right colon cancer status post partial colectomy in 2015, diabetes who presents following a fall at home.  Yesterday evening patient was going up her garage steps with groceries and lost her balance and fell back into the concrete.  She is apparently down for close to 10 hours.  She did not lose consciousness. She did hit her head and fell on her right arm.  Patient is feeling okay when seen, in sling with pain controled.  Patient was going to be discharged from ED but upon ambulation she became acutely hypoxic in the setting of not taking any of her medications for the past day and was unable to be safely discharged.   New events last 24 hours / Subjective: Feeling well this morning without acute complaints.  Remains on 3 L nasal cannula O2.  Assessment & Plan:   Principal Problem:   Acute on chronic respiratory failure with hypoxia (HCC) Active Problems:   COPD (chronic obstructive pulmonary disease) (HCC)   GERD (gastroesophageal reflux disease)   Type 2 diabetes mellitus with complication, with long-term current use of insulin (HCC)   Essential hypertension, benign   Right colon cancer (Stage I) s/p partial colectomy 01/12/14   Thrush, oral   Esophageal stricture   Acute hypoxemic respiratory failure -Hypoxic with SPO2 in the 80s on ambulation in the emergency department -Currently requiring 3 L oxygen, check ambulatory desaturation screen -CT chest unremarkable aside from chronic right upper lobe scarring -Check D-dimer  Addendum: +D Dimer. Pre-test probability for PE is moderate. Cannot get CTA chest due to elevated Cr. Check VQ  COPD -Continue breathing treatments, inhalers  Fall with left medial condyle  fracture -Orthopedic surgery consulted, placed in sling and keep nonweightbearing.  Follow-up with Dr. Percell Miller next week  AKI -Continue IV fluid and repeat BMP  UTI, present on admission -Was started on Cipro as an outpatient.  Continue  Diabetes mellitus type 2 -Hemoglobin A1c 7.1 -Sliding scale insulin    DVT prophylaxis:  enoxaparin (LOVENOX) injection 40 mg Start: 07/08/20 2200  Code Status: Full code Family Communication: No family at bedside Disposition Plan:  Status is: Observation  The patient will require care spanning > 2 midnights and should be moved to inpatient because: IV treatments appropriate due to intensity of illness or inability to take PO  Dispo: The patient is from: Home              Anticipated d/c is to: Home              Anticipated d/c date is: 1 day              Patient currently is not medically stable to d/c.  Continue to wean O2.  IV fluid for AKI.  Hopeful discharge home 12/8.   Consultants:   Orthopedic surgery  Procedures:   None  Antimicrobials:  Anti-infectives (From admission, onward)   Start     Dose/Rate Route Frequency Ordered Stop   07/08/20 2200  ciprofloxacin (CIPRO) tablet 500 mg       Note to Pharmacy: 10 day supply     500 mg Oral 3 times daily 07/08/20 1931          Objective: Vitals:   07/08/20 1810 07/08/20  1920 07/08/20 2017 07/08/20 2120  BP: (!) 157/68 (!) 166/89 (!) 173/84 (!) 156/82  Pulse: (!) 103 78 99 99  Resp: 19 17 20 19   Temp:    99.3 F (37.4 C)  TempSrc:    Oral  SpO2: 99% 98% 99% 100%  Height:    5\' 1"  (1.549 m)    Intake/Output Summary (Last 24 hours) at 07/09/2020 1055 Last data filed at 07/09/2020 0932 Gross per 24 hour  Intake 357.3 ml  Output --  Net 357.3 ml   There were no vitals filed for this visit.  Examination:  General exam: Appears calm and comfortable  Respiratory system: Clear to auscultation. Respiratory effort normal. No respiratory distress. No conversational dyspnea.   Cardiovascular system: S1 & S2 heard, RRR. No murmurs. No pedal edema. Gastrointestinal system: Abdomen is nondistended, soft and nontender. Normal bowel sounds heard. Central nervous system: Alert and oriented. No focal neurological deficits. Speech clear.  Extremities: Left upper extremity in sling Skin: No rashes, lesions or ulcers on exposed skin  Psychiatry: Judgement and insight appear normal. Mood & affect appropriate.   Data Reviewed: I have personally reviewed following labs and imaging studies  CBC: Recent Labs  Lab 07/08/20 1236 07/09/20 0146  WBC 11.2* 8.4  NEUTROABS 9.4*  --   HGB 11.4* 9.5*  HCT 36.2 29.6*  MCV 93.5 91.6  PLT 267 161   Basic Metabolic Panel: Recent Labs  Lab 07/08/20 1236 07/09/20 0146  NA 135 137  K 4.0 3.8  CL 101 105  CO2 24 24  GLUCOSE 133* 148*  BUN 24* 30*  CREATININE 1.59* 1.52*  CALCIUM 9.2 8.4*   GFR: CrCl cannot be calculated (Unknown ideal weight.). Liver Function Tests: Recent Labs  Lab 07/08/20 1236  AST 21  ALT 19  ALKPHOS 53  BILITOT 1.5*  PROT 6.6  ALBUMIN 3.5   No results for input(s): LIPASE, AMYLASE in the last 168 hours. No results for input(s): AMMONIA in the last 168 hours. Coagulation Profile: No results for input(s): INR, PROTIME in the last 168 hours. Cardiac Enzymes: Recent Labs  Lab 07/08/20 1236  CKTOTAL 101   BNP (last 3 results) No results for input(s): PROBNP in the last 8760 hours. HbA1C: Recent Labs    07/08/20 2152  HGBA1C 7.0*   CBG: Recent Labs  Lab 07/08/20 2101 07/09/20 0803  GLUCAP 254* 110*   Lipid Profile: No results for input(s): CHOL, HDL, LDLCALC, TRIG, CHOLHDL, LDLDIRECT in the last 72 hours. Thyroid Function Tests: No results for input(s): TSH, T4TOTAL, FREET4, T3FREE, THYROIDAB in the last 72 hours. Anemia Panel: No results for input(s): VITAMINB12, FOLATE, FERRITIN, TIBC, IRON, RETICCTPCT in the last 72 hours. Sepsis Labs: No results for input(s):  PROCALCITON, LATICACIDVEN in the last 168 hours.  Recent Results (from the past 240 hour(s))  Resp Panel by RT-PCR (Flu A&B, Covid) Nasopharyngeal Swab     Status: None   Collection Time: 07/08/20  5:16 PM   Specimen: Nasopharyngeal Swab; Nasopharyngeal(NP) swabs in vial transport medium  Result Value Ref Range Status   SARS Coronavirus 2 by RT PCR NEGATIVE NEGATIVE Final    Comment: (NOTE) SARS-CoV-2 target nucleic acids are NOT DETECTED.  The SARS-CoV-2 RNA is generally detectable in upper respiratory specimens during the acute phase of infection. The lowest concentration of SARS-CoV-2 viral copies this assay can detect is 138 copies/mL. A negative result does not preclude SARS-Cov-2 infection and should not be used as the sole basis for treatment or other  patient management decisions. A negative result may occur with  improper specimen collection/handling, submission of specimen other than nasopharyngeal swab, presence of viral mutation(s) within the areas targeted by this assay, and inadequate number of viral copies(<138 copies/mL). A negative result must be combined with clinical observations, patient history, and epidemiological information. The expected result is Negative.  Fact Sheet for Patients:  EntrepreneurPulse.com.au  Fact Sheet for Healthcare Providers:  IncredibleEmployment.be  This test is no t yet approved or cleared by the [place] FDA and  has been authorized for detection and/or diagnosis of SARS-CoV-2 by FDA under an Emergency Use Authorization (EUA). This EUA will remain  in effect (meaning this test can be used) for the duration of the COVID-19 declaration under Section 564(b)(1) of the Act, 21 U.S.C.section 360bbb-3(b)(1), unless the authorization is terminated  or revoked sooner.       Influenza A by PCR NEGATIVE NEGATIVE Final   Influenza B by PCR NEGATIVE NEGATIVE Final    Comment: (NOTE) The Xpert Xpress  SARS-CoV-2/FLU/RSV plus assay is intended as an aid in the diagnosis of influenza from Nasopharyngeal swab specimens and should not be used as a sole basis for treatment. Nasal washings and aspirates are unacceptable for Xpert Xpress SARS-CoV-2/FLU/RSV testing.  Fact Sheet for Patients: EntrepreneurPulse.com.au  Fact Sheet for Healthcare Providers: IncredibleEmployment.be  This test is not yet approved or cleared by the [place] FDA and has been authorized for detection and/or diagnosis of SARS-CoV-2 by FDA under an Emergency Use Authorization (EUA). This EUA will remain in effect (meaning this test can be used) for the duration of the COVID-19 declaration under Section 564(b)(1) of the Act, 21 U.S.C. section 360bbb-3(b)(1), unless the authorization is terminated or revoked.  Performed at [facility], [place] [address] [place], [place] [address]       Radiology Studies: DG Chest 2 View  Result Date: 07/08/2020 CLINICAL DATA:  Shortness of breath with weakness and dizziness. History of COPD and hypertension. EXAM: CHEST - 2 VIEW COMPARISON:  Radiographs 08/03/2019.  CT 05/25/2014. FINDINGS: The heart size and mediastinal contours are stable with aortic atherosclerosis. There is chronic volume loss in the right upper lobe with stable right apical scarring. The lungs are otherwise clear. There is no pleural effusion or pneumothorax. No acute osseous findings are seen status post right total shoulder arthroplasty. IMPRESSION: Stable chest. No active cardiopulmonary process. Electronically Signed   By: Richardean Sale M.D.   On: 07/08/2020 18:14   DG Pelvis 1-2 Views  Result Date: 07/08/2020 CLINICAL DATA:  Fall EXAM: PELVIS - 1-2 VIEW COMPARISON:  08/03/2019 FINDINGS: There is no evidence of pelvic fracture or diastasis. Sacrum partially obscured by overlying bowel gas. Mild diffuse osteopenia. IMPRESSION: Negative. Electronically Signed   By:  Davina Poke D.O.   On: 07/08/2020 11:17   DG Shoulder Right  Result Date: 07/08/2020 CLINICAL DATA:  Fall, shoulder pain EXAM: RIGHT SHOULDER - 2+ VIEW COMPARISON:  07/16/2010, 08/03/2019, 08/09/2016 FINDINGS: Status post right shoulder hemiarthroplasty. The humeral component is superiorly dislocated relative to the glenoid. This alignment is unchanged from prior x-ray 08/09/2016. There is secondary remodeling of the underlying acromion and distal clavicle. No evidence of a periprosthetic fracture. Bones are demineralized. IMPRESSION: 1. 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 Alignment is unchanged dating back to at least 2018. 2. No evidence of a periprosthetic fracture. Electronically Signed   By: Davina Poke D.O.   On:  07/08/2020 11:21   DG Elbow Complete Left  Result Date: 07/08/2020 CLINICAL DATA:  [place] with elbow pain. EXAM: LEFT ELBOW - COMPLETE 3+ VIEW COMPARISON:  None. FINDINGS: Elbow joint effusion. Fracture of the medial epicondyle of the humerus, displaced proximally about 3 mm. Articular surfaces not involved. IMPRESSION: Medial epicondyle fracture with joint effusion. Electronically Signed   By: Nelson Chimes M.D.   On: 07/08/2020 11:18   CT Head Wo Contrast  Result Date: 07/08/2020 CLINICAL DATA:  Fall, hit back of head EXAM: CT HEAD WITHOUT CONTRAST TECHNIQUE: Contiguous axial images were obtained from the base of the skull through the vertex without intravenous contrast. COMPARISON:  None. FINDINGS: Brain: There is atrophy and chronic small vessel disease changes. Infarct seen within the right frontal lobe, age indeterminate. No hemorrhage or hydrocephalus. Old right basal ganglia lacunar infarct. Vascular: No hyperdense vessel or unexpected calcification. Skull: No acute calvarial abnormality. Sinuses/Orbits: Visualized paranasal sinuses and mastoids clear. Orbital soft tissues  unremarkable. Other: None IMPRESSION: Age indeterminate right frontal infarct. Old right basal ganglia lacunar infarct. Atrophy, chronic small vessel disease. Electronically Signed   By: Rolm Baptise M.D.   On: 07/08/2020 11:55   CT CHEST WO CONTRAST  Result Date: 07/09/2020 CLINICAL DATA:  Pneumonia, effusion, or abscess suspected. Abnormal x-ray. EXAM: CT CHEST WITHOUT CONTRAST TECHNIQUE: Multidetector CT imaging of the chest was performed following the standard protocol without IV contrast. COMPARISON:  02/15/2015 chest CT.  Chest radiograph from yesterday FINDINGS: Cardiovascular: Normal heart size. Trace pericardial fluid. Extensive atheromatous calcification of the aorta and coronaries. Mediastinum/Nodes: No adenopathy or mass. Small sliding hiatal hernia. Lungs/Pleura: Area of right upper lobe consolidation and architectural distortion with somewhat nodular appearance and pleural retraction. No progression since 2016, consistent with scarring. Emphysema. There is no edema, consolidation, effusion, or pneumothorax. Upper Abdomen: No acute finding Musculoskeletal: Prominent muscular atrophy of the right-sided rotator cuff in the setting of shoulder arthroplasty. Heterotopic ossification is seen around the partially covered postoperative joint. Generalized osteopenia. IMPRESSION: 1. No acute finding. 2. Stable right upper lobe scarring when compared to 2016. 3. Aortic Atherosclerosis (ICD10-I70.0) and Emphysema (ICD10-J43.9). Electronically Signed   By: Monte Fantasia M.D.   On: 07/09/2020 08:50   CT Cervical Spine Wo Contrast  Result Date: 07/08/2020 CLINICAL DATA:  Fall down steps with pain. EXAM: CT CERVICAL, THORACIC, AND LUMBAR SPINE WITHOUT CONTRAST TECHNIQUE: Multidetector CT imaging of the cervical, thoracic and lumbar spine was performed without intravenous contrast. Multiplanar CT image reconstructions were also generated. COMPARISON:  Chest radiographs 08/03/2019. Cervical radiographs  08/03/2019. FINDINGS: CT CERVICAL SPINE FINDINGS Alignment: Mild anterolisthesis of C4 on C5, favored degenerative. Otherwise, no substantial subluxation. Skull base and vertebrae: No acute fracture. Vertebral body heights are maintained. Osteopenia. Soft tissues and spinal canal: No prevertebral fluid or swelling. No visible canal hematoma. Disc levels:  Multilevel facet hypertrophy. CT THORACIC SPINE FINDINGS Alignment: Normal. Vertebrae: There is a remote T3 compression fracture with similar height loss when comparing to prior chest radiograph from 08/03/2019. Mild T1 vertebral body height loss without trabecular sclerosis or discrete lucency. Osteopenia. Paraspinal and other soft tissues: Emphysema. Masslike consolidation in the right upper lobe. Disc levels: No substantial focal degenerative change. CT LUMBAR SPINE FINDINGS Alignment: Normal. Vertebrae: No acute fracture or focal pathologic process. Osteopenia. Paraspinal and other soft tissues: Calcific atherosclerosis of the aorta and bilateral iliac vessels. Bilateral renal cysts, incompletely characterized. Hypodense right adrenal nodule, compatible with an adrenal adenoma. Disc levels: Mild degenerative disc disease at multiple  levels. Disc bulge at L3-L4 and L4-L5 with likely at least mild canal stenosis. IMPRESSION: 1. Age indeterminate T1 compression fracture with mild height loss, favored remote given lack of trabecular sclerosis or discrete lucency. Correlate with the presence or absence of point tenderness. MRI could better evaluate acuity if clinically indicated. 2. Remote T3 compression fracture with similar height loss. 3. Masslike consolidation in the right upper lobe. Recommend dedicated chest CT to further characterize. Electronically Signed   By: Margaretha Sheffield MD   On: 07/08/2020 12:19   CT Thoracic Spine Wo Contrast  Result Date: 07/08/2020 CLINICAL DATA:  Fall down steps with pain. EXAM: CT CERVICAL, THORACIC, AND LUMBAR SPINE  WITHOUT CONTRAST TECHNIQUE: Multidetector CT imaging of the cervical, thoracic and lumbar spine was performed without intravenous contrast. Multiplanar CT image reconstructions were also generated. COMPARISON:  Chest radiographs 08/03/2019. Cervical radiographs 08/03/2019. FINDINGS: CT CERVICAL SPINE FINDINGS Alignment: Mild anterolisthesis of C4 on C5, favored degenerative. Otherwise, no substantial subluxation. Skull base and vertebrae: No acute fracture. Vertebral body heights are maintained. Osteopenia. Soft tissues and spinal canal: No prevertebral fluid or swelling. No visible canal hematoma. Disc levels:  Multilevel facet hypertrophy. CT THORACIC SPINE FINDINGS Alignment: Normal. Vertebrae: There is a remote T3 compression fracture with similar height loss when comparing to prior chest radiograph from 08/03/2019. Mild T1 vertebral body height loss without trabecular sclerosis or discrete lucency. Osteopenia. Paraspinal and other soft tissues: Emphysema. Masslike consolidation in the right upper lobe. Disc levels: No substantial focal degenerative change. CT LUMBAR SPINE FINDINGS Alignment: Normal. Vertebrae: No acute fracture or focal pathologic process. Osteopenia. Paraspinal and other soft tissues: Calcific atherosclerosis of the aorta and bilateral iliac vessels. Bilateral renal cysts, incompletely characterized. Hypodense right adrenal nodule, compatible with an adrenal adenoma. Disc levels: Mild degenerative disc disease at multiple levels. Disc bulge at L3-L4 and L4-L5 with likely at least mild canal stenosis. IMPRESSION: 1. Age indeterminate T1 compression fracture with mild height loss, favored remote given lack of trabecular sclerosis or discrete lucency. Correlate with the presence or absence of point tenderness. MRI could better evaluate acuity if clinically indicated. 2. Remote T3 compression fracture with similar height loss. 3. Masslike consolidation in the right upper lobe. Recommend dedicated  chest CT to further characterize. Electronically Signed   By: Margaretha Sheffield MD   On: 07/08/2020 12:19   CT Lumbar Spine Wo Contrast  Result Date: 07/08/2020 CLINICAL DATA:  Fall down steps with pain. EXAM: CT CERVICAL, THORACIC, AND LUMBAR SPINE WITHOUT CONTRAST TECHNIQUE: Multidetector CT imaging of the cervical, thoracic and lumbar spine was performed without intravenous contrast. Multiplanar CT image reconstructions were also generated. COMPARISON:  Chest radiographs 08/03/2019. Cervical radiographs 08/03/2019. FINDINGS: CT CERVICAL SPINE FINDINGS Alignment: Mild anterolisthesis of C4 on C5, favored degenerative. Otherwise, no substantial subluxation. Skull base and vertebrae: No acute fracture. Vertebral body heights are maintained. Osteopenia. Soft tissues and spinal canal: No prevertebral fluid or swelling. No visible canal hematoma. Disc levels:  Multilevel facet hypertrophy. CT THORACIC SPINE FINDINGS Alignment: Normal. Vertebrae: There is a remote T3 compression fracture with similar height loss when comparing to prior chest radiograph from 08/03/2019. Mild T1 vertebral body height loss without trabecular sclerosis or discrete lucency. Osteopenia. Paraspinal and other soft tissues: Emphysema. Masslike consolidation in the right upper lobe. Disc levels: No substantial focal degenerative change. CT LUMBAR SPINE FINDINGS Alignment: Normal. Vertebrae: No acute fracture or focal pathologic process. Osteopenia. Paraspinal and other soft tissues: Calcific atherosclerosis of the aorta and bilateral iliac vessels.  Bilateral renal cysts, incompletely characterized. Hypodense right adrenal nodule, compatible with an adrenal adenoma. Disc levels: Mild degenerative disc disease at multiple levels. Disc bulge at L3-L4 and L4-L5 with likely at least mild canal stenosis. IMPRESSION: 1. Age indeterminate T1 compression fracture with mild height loss, favored remote given lack of trabecular sclerosis or discrete  lucency. Correlate with the presence or absence of point tenderness. MRI could better evaluate acuity if clinically indicated. 2. Remote T3 compression fracture with similar height loss. 3. Masslike consolidation in the right upper lobe. Recommend dedicated chest CT to further characterize. Electronically Signed   By: Margaretha Sheffield MD   On: 07/08/2020 12:19      Scheduled Meds: . AeroChamber Plus Flo-Vu Medium  1 each Other Once  . aspirin EC  81 mg Oral Daily  . ciprofloxacin  500 mg Oral TID  . enoxaparin (LOVENOX) injection  40 mg Subcutaneous Q24H  . ferrous sulfate  325 mg Oral Daily  . fluticasone furoate-vilanterol  1 puff Inhalation Daily   And  . umeclidinium bromide  1 puff Inhalation Daily  . insulin aspart  0-9 Units Subcutaneous TID WC  . nystatin  5 mL Oral BID  . sodium chloride flush  3 mL Intravenous Q12H   Continuous Infusions: . sodium chloride 75 mL/hr at 07/09/20 0815     LOS: 0 days      Time spent: 30 minutes   Dessa Phi, DO [facility] 07/09/2020, 10:55 AM   Available via Epic secure chat 7am-7pm After these hours, please refer to coverage provider listed on [email or web address]

## 2020-07-09 NOTE — TOC Initial Note (Signed)
Transition of Care [facility]) - Initial/Assessment Note    Patient Details  Name: Amanda [name] MRN: [id number] Date of Birth: Oct 02, 1940  [facility]) CM/SW Contact:    Carles Collet, RN Phone Number: 07/09/2020, 10:00 AM  Clinical Narrative:                Damaris Schooner w patient and daughter Lattie Haw at bedside. Patient fell at home resulting in nonsurgical fracture of elbow.  She has RW at home, 3/1. Children provide support as needed. Patient drives to appointments, has PCP, no barriers to meds.  They decline HH services.   Expected Discharge Plan: Home/Self Care Barriers to Discharge: Continued Medical Work up   Patient Goals and CMS Choice Patient states their goals for this hospitalization and ongoing recovery are:: to return home      Expected Discharge Plan and Services Expected Discharge Plan: Home/Self Care   Discharge Planning Services: CM Consult   Living arrangements for the past 2 months: Single Family Home                           HH Arranged: NA, Patient Refused HH          Prior Living Arrangements/Services Living arrangements for the past 2 months: Single Family Home Lives with:: Self              Current home services: DME    Activities of Daily Living Home Assistive Devices/Equipment: None ADL Screening (condition at time of admission) Patient's cognitive ability adequate to safely complete daily activities?: Yes Is the patient deaf or have difficulty hearing?: No Does the patient have difficulty seeing, even when wearing glasses/contacts?: No Does the patient have difficulty concentrating, remembering, or making decisions?: No Patient able to express need for assistance with ADLs?: No Does the patient have difficulty dressing or bathing?: No Independently performs ADLs?: Yes (appropriate for developmental age) Does the patient have difficulty walking or climbing stairs?: No Weakness of Legs: None Weakness of Arms/Hands:  None  Permission Sought/Granted                  Emotional Assessment              Admission diagnosis:  Pulmonary mass [R91.8] Closed displaced fracture of medial condyle of left humerus, initial encounter [S42.462A] Acute on chronic respiratory failure with hypoxia ([place]) [J96.21] Cerebrovascular accident (CVA), unspecified mechanism ([place]) [I63.9] Patient Active Problem List   Diagnosis Date Noted  . Acute on chronic respiratory failure with hypoxia ([place]) 07/08/2020  . Abnormal CXR 09/18/2016  . RSV bronchiolitis 08/10/2016  . COPD exacerbation ([place]) 08/09/2016  . Community acquired pneumonia 08/09/2016  . Hyponatremia 08/09/2016  . Hyperkalemia 08/09/2016  . Hyperbilirubinemia 08/09/2016  . Esophageal stricture   . Hx of adenomatous colonic polyps 05/15/2015  . Thrush, oral 10/25/2014  . Chronic respiratory failure with hypoxia ([place]) 05/18/2014  . Sepsis, unspecified organism ([place]) 03/28/2014  . Atrial fibrillation with RVR ([place]) 03/28/2014  . Right colon cancer (Stage I) s/p partial colectomy 01/12/14 01/10/2014  . Stricture and stenosis of esophagus 01/09/2014  . COPD (chronic obstructive pulmonary disease) ([place]) 01/08/2014  . GERD (gastroesophageal reflux disease)   . Hyperlipidemia   . Type 2 diabetes mellitus with complication, with long-term current use of insulin ([place])   . Essential hypertension, benign    PCP:  Timoteo Gaul, FNP Pharmacy:   [facility] [place] - [address] [place] [address] Phone: [phone] Fax: [phone]     Social Determinants of Health (SDOH) Interventions    Readmission Risk Interventions No flowsheet data found.

## 2020-07-09 NOTE — Evaluation (Signed)
Physical Therapy Evaluation Patient Details Name: Amanda [name] MRN: [id number] DOB: 1941-04-14 Today's Date: 07/09/2020   History of Present Illness  79 yo female with onset of a fall down her entrance stairs while bringing in her groceries was down for several hours, brought to ED.  Has a L elbow medial epicondyle fracture, indeterminate L1 fracture and R frontal infarct.  Pt was dizzy, weak and SOB when she fell.  PMHx:  COPD, HTN, atherosclerosis, R basal ganglia lacunar infarct, emphysema.   Clinical Impression  Pt was seen for mobility with HHA after sustaining a fall and fractures of L1 and L elbow.  Pt is continually either shimmying out of sling or trying to use LUE, and required frequent reminders not to do so.  Her plan is to work toward better standing balance control and distances with gait, as well as to see if a one sided AD will handle her balance issues.  Recommending a stay in rehab setting due to her struggle to get OOB, to stand from lower surfaces and the geography of home with no family to stay with her.  Follow acutely for goals of PT.    Follow Up Recommendations CIR    Equipment Recommendations  None recommended by PT    Recommendations for Other Services Rehab consult     Precautions / Restrictions Precautions Precautions: Fall;Back Precaution Booklet Issued: No Precaution Comments: on O2, monitor sats Restrictions Weight Bearing Restrictions: Yes LUE Weight Bearing: Non weight bearing Other Position/Activity Restrictions: sling on LUE at all times      Mobility  Bed Mobility Overal bed mobility: Needs Assistance Bed Mobility: Rolling;Sidelying to Sit;Sit to Sidelying Rolling: Min assist Sidelying to sit: Min assist     Sit to sidelying: Min assist General bed mobility comments: reviewed protective body mechanics for spine    Transfers Overall transfer level: Needs assistance Equipment used: 1 person hand held assist Transfers: Sit to/from  Stand Sit to Stand: Min guard;Min assist (depending on surface height)         General transfer comment: higher surfaces are min guard and lower min assist  Ambulation/Gait Ambulation/Gait assistance: Min assist;Min guard Gait Distance (Feet): 40 Feet (10+15+15) Assistive device: 1 person hand held assist Gait Pattern/deviations: Step-through pattern;Step-to pattern;Decreased stride length;Staggering left Gait velocity: reduced Gait velocity interpretation: <1.8 ft/sec, indicate of risk for recurrent falls General Gait Details: has L side list that requires correction by PT  Stairs            Wheelchair Mobility    Modified Rankin (Stroke Patients Only)       Balance Overall balance assessment: History of Falls;Needs assistance Sitting-balance support: Feet supported;Single extremity supported Sitting balance-Leahy Scale: Fair Sitting balance - Comments: fair on commode, trying to use LUE to stand     Standing balance-Leahy Scale: Poor Standing balance comment: requires HHA to correct minor static balance changes                             Pertinent Vitals/Pain Pain Assessment: Faces Faces Pain Scale: Hurts little more Pain Location: RLE from fall Pain Descriptors / Indicators: Guarding Pain Intervention(s): Monitored during session;Repositioned    Home Living Family/patient expects to be discharged to:: Private residence Living Arrangements: Alone Available Help at Discharge: Family;Available PRN/intermittently Type of Home: House Home Access: Stairs to enter Entrance Stairs-Rails: Left Entrance Stairs-Number of Steps: 4 Home Layout: One level Home Equipment: Walker - 4 wheels;Bedside commode Additional  Comments: family next door to her but daughter works    Prior Function Level of Independence: Independent with assistive device(s)         Comments: pt has been driving and doing her own shopping     Hand Dominance   Dominant Hand:  Right    Extremity/Trunk Assessment   Upper Extremity Assessment Upper Extremity Assessment: LUE deficits/detail LUE Deficits / Details: L elbow fracture and in sling LUE: Unable to fully assess due to immobilization LUE Coordination: decreased gross motor    Lower Extremity Assessment Lower Extremity Assessment: Generalized weakness    Cervical / Trunk Assessment Cervical / Trunk Assessment: Kyphotic  Communication   Communication: No difficulties  Cognition Arousal/Alertness: Awake/alert Behavior During Therapy: WFL for tasks assessed/performed Overall Cognitive Status: Within Functional Limits for tasks assessed                                 General Comments: pt is not interested in having therapy at her house      General Comments General comments (skin integrity, edema, etc.): Pt is very agreeable to walk but is not interested in rehab after her hosp stay. Talked with her about the need for around the clock care    Exercises     Assessment/Plan    PT Assessment Patient needs continued PT services  PT Problem List Decreased strength;Decreased activity tolerance;Decreased balance;Decreased coordination;Decreased safety awareness;Decreased knowledge of use of DME;Cardiopulmonary status limiting activity       PT Treatment Interventions DME instruction;Gait training;Stair training;Functional mobility training;Therapeutic exercise;Therapeutic activities;Balance training;Neuromuscular re-education;Patient/family education    PT Goals (Current goals can be found in the Care Plan section)  Acute Rehab PT Goals Patient Stated Goal: to get back home to live PT Goal Formulation: With patient Time For Goal Achievement: 07/23/20 Potential to Achieve Goals: Good    Frequency Min 3X/week   Barriers to discharge Inaccessible home environment;Decreased caregiver support stairs to enter and no family home with her at all times    Co-evaluation                AM-PAC PT "6 Clicks" Mobility  Outcome Measure Help needed turning from your back to your side while in a flat bed without using bedrails?: A Little Help needed moving from lying on your back to sitting on the side of a flat bed without using bedrails?: A Little Help needed moving to and from a bed to a chair (including a wheelchair)?: A Little Help needed standing up from a chair using your arms (e.g., wheelchair or bedside chair)?: A Little Help needed to walk in hospital room?: A Little Help needed climbing 3-5 steps with a railing? : Total 6 Click Score: 16    End of Session Equipment Utilized During Treatment: Gait belt;Oxygen Activity Tolerance: Patient limited by fatigue;Treatment limited secondary to medical complications (Comment) Patient left: in bed;with call bell/phone within reach;with bed alarm set Nurse Communication: Mobility status;Other (comment) (discharge planning) PT Visit Diagnosis: Unsteadiness on feet (R26.81);Muscle weakness (generalized) (M62.81);History of falling (Z91.81)    Time: 9528-4132 PT Time Calculation (min) (ACUTE ONLY): 31 min   Charges:   PT Evaluation $PT Eval Moderate Complexity: 1 Mod PT Treatments $Gait Training: 8-22 mins       Ramond Dial 07/09/2020, 12:21 PM  Mee Hives, PT MS Acute Rehab Dept. Number: [place] [facility] [place]

## 2020-07-09 NOTE — Progress Notes (Signed)
Rehab Admissions Coordinator Note:  Patient was screened by Cleatrice Burke for appropriateness for an Inpatient Acute Rehab Consult per PT recs. Noted just admitted . She may progress to be able to d/c home with [facility] and family support. I recommend OT eval and I will follow her progress over the next 24 hrs.  Cleatrice Burke RN MSN 07/09/2020, 1:13 PM  I can be reached at [phone].

## 2020-07-09 NOTE — Plan of Care (Signed)

## 2020-07-10 LAB — BASIC METABOLIC PANEL
Anion gap: 9 (ref 5–15)
BUN: 22 mg/dL (ref 8–23)
CO2: 23 mmol/L (ref 22–32)
Calcium: 8.4 mg/dL — ABNORMAL LOW (ref 8.9–10.3)
Chloride: 104 mmol/L (ref 98–111)
Creatinine, Ser: 1.25 mg/dL — ABNORMAL HIGH (ref 0.44–1.00)
GFR, Estimated: 43 mL/min — ABNORMAL LOW (ref >60)
Glucose, Bld: 121 mg/dL — ABNORMAL HIGH (ref 70–99)
Potassium: 3.8 mmol/L (ref 3.5–5.1)
Sodium: 136 mmol/L (ref 135–145)

## 2020-07-10 LAB — GLUCOSE, CAPILLARY
Glucose-Capillary: 128 mg/dL — ABNORMAL HIGH (ref 70–99)
Glucose-Capillary: 159 mg/dL — ABNORMAL HIGH (ref 70–99)
Glucose-Capillary: 258 mg/dL — ABNORMAL HIGH (ref 70–99)

## 2020-07-10 MED ORDER — INSULIN NPH ISOPHANE & REGULAR (70-30) 100 UNIT/ML ~~LOC~~ SUSP: 4.0000 [IU] | Freq: Every day | SUBCUTANEOUS | Status: AC

## 2020-07-10 NOTE — Discharge Summary (Addendum)
Physician Discharge Summary  Amanda [name] HWE:[id number] DOB: 06/11/1941 DOA: 07/08/2020  PCP: Timoteo Gaul, FNP  Admit date: 07/08/2020 Discharge date: 07/10/2020  Time spent: 35 minutes  Recommendations for Outpatient Follow-up:  1. Orthopedics Dr. Percell Miller in 7 to 10 days 2. PCP in 1 week, home O2 set up for chronic hypoxia/COPD 3. Consider CODE STATUS/goals of care discussions   Discharge Diagnoses:  Principal Problem:   Acute on chronic respiratory failure with hypoxia (HCC)   Elbow fracture   COPD (chronic obstructive pulmonary disease) (HCC)   GERD (gastroesophageal reflux disease)   Type 2 diabetes mellitus with complication, with long-term current use of insulin (HCC)   Essential hypertension, benign   Right colon cancer (Stage I) s/p partial colectomy 01/12/14   Thrush, oral   Esophageal stricture   Acute hypoxemic respiratory failure ([place])   Discharge Condition: Stable  Diet recommendation: Diabetic  History of present illness:  Amanda [name] is an67 y.o.femalewith medical history significant ofA. fib, COPD, esophageal stricture, GERD, hyperlipidemia, right colon cancer status post partial colectomy in 2015, diabetes who presents following a fall at home. Yesterday evening patient was going up her garage steps with groceries and lost her balance and fell back into the concrete. She is apparently down for close to 10 hours. She did not lose consciousness. She did hit her head and fell on her right arm. Patient is feeling okay when seen,in sling with pain controled.Patient was going to be discharged from ED but upon ambulation she was noted to be mildly hypoxic to high 80s and hence admitted  Hospital Course:   Acute hypoxemic respiratory failure -Hypoxic with SPO2 in the 85-86% on ambulation in the emergency department without symptoms -Had a CT chest which was unremarkable apart from chronic right upper lobe scarring, VQ scan was negative for pulmonary  embolism -Mild hypoxia was felt to be secondary to her underlying COPD, no evidence of acute exacerbation at this time -Weaned off oxygen at rest, but with ambulation O2 sats dropped below 88% and set up with 2 L home O2 at discharge  COPD -Continue breathing treatments, inhalers  Fall with left medial condyle fracture -Orthopedic surgery consulted, placed in sling and keep nonweightbearing.  Follow-up with Dr. Percell Miller next week  AKI -Improved with gentle hydration  UTI, present on admission -Was started on Cipro as an outpatient on 12/2, completed 7 days today, asymptomatic now, antibiotics discontinued  Diabetes mellitus type 2 -Hemoglobin A1c 7.1 - home regimen of insulin 70/30 dose decreased to 4 units daily with breakfast, monitor CBGs closely and consider discontinuing insulin if she has episodes of hypoglycemia    Consultations:  Orthopedics  Discharge Exam: Vitals:   07/10/20 0835 07/10/20 1528  BP:  (!) 159/67  Pulse: 82 92  Resp: 18 18  Temp:  98.4 F (36.9 C)  SpO2: 98% 91%    General: AAOx3 Cardiovascular: S1S2/RRR Respiratory: CTAB  Discharge Instructions   Discharge Instructions    Diet - low sodium heart healthy   Complete by: As directed    Increase activity slowly   Complete by: As directed      Allergies as of 07/10/2020   No Known Allergies     Medication List    STOP taking these medications   ciprofloxacin 500 MG tablet Commonly known as: CIPRO     TAKE these medications   albuterol 108 (90 Base) MCG/ACT inhaler Commonly known as: VENTOLIN HFA Inhale 2 puffs into the lungs every 6 (six) hours as  needed for wheezing or shortness of breath.   aspirin 81 MG EC tablet Take 1 tablet (81 mg total) by mouth daily.   ferrous sulfate 325 (65 FE) MG tablet Take 1 tablet (325 mg total) by mouth daily.   hydrochlorothiazide 12.5 MG capsule Commonly known as: MICROZIDE Take 12.5 mg by mouth every morning.   insulin NPH-regular  Human (70-30) 100 UNIT/ML injection Inject 6 Units into the skin daily with breakfast.   nystatin 100000 UNIT/ML suspension Commonly known as: MYCOSTATIN Take 5 mLs (500,000 Units total) by mouth 2 (two) times daily.   omeprazole 40 MG capsule Commonly known as: PRILOSEC TAKE 1 CAPSULE BY MOUTH TWICE DAILY 30 MINS BEFORE BREAKFAST  AND SUPPER   Trelegy Ellipta 100-62.5-25 MCG/INH Aepb Generic drug: Fluticasone-Umeclidin-Vilant INHALE 1 PUFF INTO THE LUNGS ONCE DAILY What changed:   how much to take  how to take this  when to take this  additional instructions   Vitamin D (Ergocalciferol) 1.25 MG (50000 UNIT) Caps capsule Commonly known as: DRISDOL Take 50,000 Units by mouth every 7 (seven) days. On fridays      No Known Allergies  Follow-up Information    Timoteo Gaul, FNP Follow up in 1 week(s).   Specialty: Family Medicine Contact information: [place] [address] [phone]        Renette Butters, MD Follow up.   Specialty: Orthopedic Surgery Why: call for an appointment -- for treatment of elbow fracture Contact information: [address] [place] [address] [place] [address] [phone]                The results of significant diagnostics from this hospitalization (including imaging, microbiology, ancillary and laboratory) are listed below for reference.    Significant Diagnostic Studies: DG Chest 2 View  Result Date: 07/08/2020 CLINICAL DATA:  Shortness of breath with weakness and dizziness. History of COPD and hypertension. EXAM: CHEST - 2 VIEW COMPARISON:  Radiographs 08/03/2019.  CT 05/25/2014. FINDINGS: The heart size and mediastinal contours are stable with aortic atherosclerosis. There is chronic volume loss in the right upper lobe with stable right apical scarring. The lungs are otherwise clear. There is no pleural effusion or pneumothorax. No acute osseous findings are seen status post right total shoulder  arthroplasty. IMPRESSION: Stable chest. No active cardiopulmonary process. Electronically Signed   By: Richardean Sale M.D.   On: 07/08/2020 18:14   DG Pelvis 1-2 Views  Result Date: 07/08/2020 CLINICAL DATA:  Fall EXAM: PELVIS - 1-2 VIEW COMPARISON:  08/03/2019 FINDINGS: There is no evidence of pelvic fracture or diastasis. Sacrum partially obscured by overlying bowel gas. Mild diffuse osteopenia. IMPRESSION: Negative. Electronically Signed   By: Davina Poke D.O.   On: 07/08/2020 11:17   DG Shoulder Right  Result Date: 07/08/2020 CLINICAL DATA:  Fall, shoulder pain EXAM: RIGHT SHOULDER - 2+ VIEW COMPARISON:  07/16/2010, 08/03/2019, 08/09/2016 FINDINGS: Status post right shoulder hemiarthroplasty. The humeral component is superiorly dislocated relative to the glenoid. This alignment is unchanged from prior x-ray 08/09/2016. There is secondary remodeling of the underlying acromion and distal clavicle. No evidence of a periprosthetic fracture. Bones are demineralized. IMPRESSION: 1. 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 Alignment is unchanged dating back to at least 2018. 2. No evidence of a periprosthetic fracture. Electronically Signed   By: Davina Poke D.O.   On: 07/08/2020 11:21   DG Elbow Complete Left  Result Date: 07/08/2020 CLINICAL DATA:  [place] with elbow pain. EXAM: LEFT ELBOW - COMPLETE 3+ VIEW COMPARISON:  None. FINDINGS: Elbow joint effusion. Fracture of the medial epicondyle of the humerus, displaced proximally about 3 mm. Articular surfaces not involved. IMPRESSION: Medial epicondyle fracture with joint effusion. Electronically Signed   By: Nelson Chimes M.D.   On: 07/08/2020 11:18   CT Head Wo Contrast  Result Date: 07/08/2020 CLINICAL DATA:  Fall, hit back of head EXAM: CT HEAD WITHOUT CONTRAST TECHNIQUE: Contiguous axial images were obtained from the base of the skull through the vertex  without intravenous contrast. COMPARISON:  None. FINDINGS: Brain: There is atrophy and chronic small vessel disease changes. Infarct seen within the right frontal lobe, age indeterminate. No hemorrhage or hydrocephalus. Old right basal ganglia lacunar infarct. Vascular: No hyperdense vessel or unexpected calcification. Skull: No acute calvarial abnormality. Sinuses/Orbits: Visualized paranasal sinuses and mastoids clear. Orbital soft tissues unremarkable. Other: None IMPRESSION: Age indeterminate right frontal infarct. Old right basal ganglia lacunar infarct. Atrophy, chronic small vessel disease. Electronically Signed   By: Rolm Baptise M.D.   On: 07/08/2020 11:55   CT CHEST WO CONTRAST  Result Date: 07/09/2020 CLINICAL DATA:  Pneumonia, effusion, or abscess suspected. Abnormal x-ray. EXAM: CT CHEST WITHOUT CONTRAST TECHNIQUE: Multidetector CT imaging of the chest was performed following the standard protocol without IV contrast. COMPARISON:  02/15/2015 chest CT.  Chest radiograph from yesterday FINDINGS: Cardiovascular: Normal heart size. Trace pericardial fluid. Extensive atheromatous calcification of the aorta and coronaries. Mediastinum/Nodes: No adenopathy or mass. Small sliding hiatal hernia. Lungs/Pleura: Area of right upper lobe consolidation and architectural distortion with somewhat nodular appearance and pleural retraction. No progression since 2016, consistent with scarring. Emphysema. There is no edema, consolidation, effusion, or pneumothorax. Upper Abdomen: No acute finding Musculoskeletal: Prominent muscular atrophy of the right-sided rotator cuff in the setting of shoulder arthroplasty. Heterotopic ossification is seen around the partially covered postoperative joint. Generalized osteopenia. IMPRESSION: 1. No acute finding. 2. Stable right upper lobe scarring when compared to 2016. 3. Aortic Atherosclerosis (ICD10-I70.0) and Emphysema (ICD10-J43.9). Electronically Signed   By: Monte Fantasia  M.D.   On: 07/09/2020 08:50   CT Cervical Spine Wo Contrast  Result Date: 07/08/2020 CLINICAL DATA:  Fall down steps with pain. EXAM: CT CERVICAL, THORACIC, AND LUMBAR SPINE WITHOUT CONTRAST TECHNIQUE: Multidetector CT imaging of the cervical, thoracic and lumbar spine was performed without intravenous contrast. Multiplanar CT image reconstructions were also generated. COMPARISON:  Chest radiographs 08/03/2019. Cervical radiographs 08/03/2019. FINDINGS: CT CERVICAL SPINE FINDINGS Alignment: Mild anterolisthesis of C4 on C5, favored degenerative. Otherwise, no substantial subluxation. Skull base and vertebrae: No acute fracture. Vertebral body heights are maintained. Osteopenia. Soft tissues and spinal canal: No prevertebral fluid or swelling. No visible canal hematoma. Disc levels:  Multilevel facet hypertrophy. CT THORACIC SPINE FINDINGS Alignment: Normal. Vertebrae: There is a remote T3 compression fracture with similar height loss when comparing to prior chest radiograph from 08/03/2019. Mild T1 vertebral body height loss without trabecular sclerosis or discrete lucency. Osteopenia. Paraspinal and other soft tissues: Emphysema. Masslike consolidation in the right upper lobe. Disc levels: No substantial focal degenerative change. CT LUMBAR SPINE FINDINGS Alignment: Normal. Vertebrae: No acute fracture or focal pathologic process. Osteopenia. Paraspinal and other soft tissues: Calcific atherosclerosis of the aorta and bilateral iliac vessels. Bilateral renal cysts, incompletely characterized. Hypodense right adrenal nodule, compatible with an adrenal adenoma. Disc levels: Mild degenerative disc disease at multiple levels. Disc bulge at L3-L4 and L4-L5 with likely  at least mild canal stenosis. IMPRESSION: 1. Age indeterminate T1 compression fracture with mild height loss, favored remote given lack of trabecular sclerosis or discrete lucency. Correlate with the presence or absence of point tenderness. MRI could  better evaluate acuity if clinically indicated. 2. Remote T3 compression fracture with similar height loss. 3. Masslike consolidation in the right upper lobe. Recommend dedicated chest CT to further characterize. Electronically Signed   By: Margaretha Sheffield MD   On: 07/08/2020 12:19   CT Thoracic Spine Wo Contrast  Result Date: 07/08/2020 CLINICAL DATA:  Fall down steps with pain. EXAM: CT CERVICAL, THORACIC, AND LUMBAR SPINE WITHOUT CONTRAST TECHNIQUE: Multidetector CT imaging of the cervical, thoracic and lumbar spine was performed without intravenous contrast. Multiplanar CT image reconstructions were also generated. COMPARISON:  Chest radiographs 08/03/2019. Cervical radiographs 08/03/2019. FINDINGS: CT CERVICAL SPINE FINDINGS Alignment: Mild anterolisthesis of C4 on C5, favored degenerative. Otherwise, no substantial subluxation. Skull base and vertebrae: No acute fracture. Vertebral body heights are maintained. Osteopenia. Soft tissues and spinal canal: No prevertebral fluid or swelling. No visible canal hematoma. Disc levels:  Multilevel facet hypertrophy. CT THORACIC SPINE FINDINGS Alignment: Normal. Vertebrae: There is a remote T3 compression fracture with similar height loss when comparing to prior chest radiograph from 08/03/2019. Mild T1 vertebral body height loss without trabecular sclerosis or discrete lucency. Osteopenia. Paraspinal and other soft tissues: Emphysema. Masslike consolidation in the right upper lobe. Disc levels: No substantial focal degenerative change. CT LUMBAR SPINE FINDINGS Alignment: Normal. Vertebrae: No acute fracture or focal pathologic process. Osteopenia. Paraspinal and other soft tissues: Calcific atherosclerosis of the aorta and bilateral iliac vessels. Bilateral renal cysts, incompletely characterized. Hypodense right adrenal nodule, compatible with an adrenal adenoma. Disc levels: Mild degenerative disc disease at multiple levels. Disc bulge at L3-L4 and L4-L5 with  likely at least mild canal stenosis. IMPRESSION: 1. Age indeterminate T1 compression fracture with mild height loss, favored remote given lack of trabecular sclerosis or discrete lucency. Correlate with the presence or absence of point tenderness. MRI could better evaluate acuity if clinically indicated. 2. Remote T3 compression fracture with similar height loss. 3. Masslike consolidation in the right upper lobe. Recommend dedicated chest CT to further characterize. Electronically Signed   By: Margaretha Sheffield MD   On: 07/08/2020 12:19   CT Lumbar Spine Wo Contrast  Result Date: 07/08/2020 CLINICAL DATA:  Fall down steps with pain. EXAM: CT CERVICAL, THORACIC, AND LUMBAR SPINE WITHOUT CONTRAST TECHNIQUE: Multidetector CT imaging of the cervical, thoracic and lumbar spine was performed without intravenous contrast. Multiplanar CT image reconstructions were also generated. COMPARISON:  Chest radiographs 08/03/2019. Cervical radiographs 08/03/2019. FINDINGS: CT CERVICAL SPINE FINDINGS Alignment: Mild anterolisthesis of C4 on C5, favored degenerative. Otherwise, no substantial subluxation. Skull base and vertebrae: No acute fracture. Vertebral body heights are maintained. Osteopenia. Soft tissues and spinal canal: No prevertebral fluid or swelling. No visible canal hematoma. Disc levels:  Multilevel facet hypertrophy. CT THORACIC SPINE FINDINGS Alignment: Normal. Vertebrae: There is a remote T3 compression fracture with similar height loss when comparing to prior chest radiograph from 08/03/2019. Mild T1 vertebral body height loss without trabecular sclerosis or discrete lucency. Osteopenia. Paraspinal and other soft tissues: Emphysema. Masslike consolidation in the right upper lobe. Disc levels: No substantial focal degenerative change. CT LUMBAR SPINE FINDINGS Alignment: Normal. Vertebrae: No acute fracture or focal pathologic process. Osteopenia. Paraspinal and other soft tissues: Calcific atherosclerosis of the  aorta and bilateral iliac vessels. Bilateral renal cysts, incompletely characterized. Hypodense right adrenal  nodule, compatible with an adrenal adenoma. Disc levels: Mild degenerative disc disease at multiple levels. Disc bulge at L3-L4 and L4-L5 with likely at least mild canal stenosis. IMPRESSION: 1. Age indeterminate T1 compression fracture with mild height loss, favored remote given lack of trabecular sclerosis or discrete lucency. Correlate with the presence or absence of point tenderness. MRI could better evaluate acuity if clinically indicated. 2. Remote T3 compression fracture with similar height loss. 3. Masslike consolidation in the right upper lobe. Recommend dedicated chest CT to further characterize. Electronically Signed   By: Margaretha Sheffield MD   On: 07/08/2020 12:19   NM Pulmonary Perfusion  Result Date: 07/09/2020 CLINICAL DATA:  Fall yesterday onto concrete, down close to 10 hours, no loss of consciousness, became hypoxic EXAM: NUCLEAR MEDICINE PERFUSION LUNG SCAN TECHNIQUE: Perfusion images were obtained in multiple projections after intravenous injection of radiopharmaceutical. Ventilation scans intentionally deferred if perfusion scan and chest x-ray adequate for interpretation during COVID 19 epidemic. RADIOPHARMACEUTICALS:  4.4 mCi Tc-63m MAA IV COMPARISON:  Chest radiograph 07/08/2020 FINDINGS: Nonsegmental diminished perfusion at the upper lobes greater on LEFT. This corresponds with COPD changes on chest radiograph, as well as a small area of scarring at RIGHT apex. No segmental or subsegmental perfusion defects. Findings are most suggestive of parenchymal lung disease and less suggestive of pulmonary embolism. IMPRESSION: Nonsegmental diminished perfusion bilaterally in the upper lobes greater on LEFT, corresponding to emphysematous changes and RIGHT upper lobe scarring on CT. No definite scintigraphic evidence of pulmonary embolism. Electronically Signed   By: Lavonia Dana M.D.    On: 07/09/2020 16:24    Microbiology: Recent Results (from the past 240 hour(s))  Resp Panel by RT-PCR (Flu A&B, Covid) Nasopharyngeal Swab     Status: None   Collection Time: 07/08/20  5:16 PM   Specimen: Nasopharyngeal Swab; Nasopharyngeal(NP) swabs in vial transport medium  Result Value Ref Range Status   SARS Coronavirus 2 by RT PCR NEGATIVE NEGATIVE Final    Comment: (NOTE) SARS-CoV-2 target nucleic acids are NOT DETECTED.  The SARS-CoV-2 RNA is generally detectable in upper respiratory specimens during the acute phase of infection. The lowest concentration of SARS-CoV-2 viral copies this assay can detect is 138 copies/mL. A negative result does not preclude SARS-Cov-2 infection and should not be used as the sole basis for treatment or other patient management decisions. A negative result may occur with  improper specimen collection/handling, submission of specimen other than nasopharyngeal swab, presence of viral mutation(s) within the areas targeted by this assay, and inadequate number of viral copies(<138 copies/mL). A negative result must be combined with clinical observations, patient history, and epidemiological information. The expected result is Negative.  Fact Sheet for Patients:  EntrepreneurPulse.com.au  Fact Sheet for Healthcare Providers:  IncredibleEmployment.be  This test is no t yet approved or cleared by the [place] FDA and  has been authorized for detection and/or diagnosis of SARS-CoV-2 by FDA under an Emergency Use Authorization (EUA). This EUA will remain  in effect (meaning this test can be used) for the duration of the COVID-19 declaration under Section 564(b)(1) of the Act, 21 U.S.C.section 360bbb-3(b)(1), unless the authorization is terminated  or revoked sooner.       Influenza A by PCR NEGATIVE NEGATIVE Final   Influenza B by PCR NEGATIVE NEGATIVE Final    Comment: (NOTE) The Xpert Xpress  SARS-CoV-2/FLU/RSV plus assay is intended as an aid in the diagnosis of influenza from Nasopharyngeal swab specimens and should not be used as a  sole basis for treatment. Nasal washings and aspirates are unacceptable for Xpert Xpress SARS-CoV-2/FLU/RSV testing.  Fact Sheet for Patients: EntrepreneurPulse.com.au  Fact Sheet for Healthcare Providers: IncredibleEmployment.be  This test is not yet approved or cleared by the [place] FDA and has been authorized for detection and/or diagnosis of SARS-CoV-2 by FDA under an Emergency Use Authorization (EUA). This EUA will remain in effect (meaning this test can be used) for the duration of the COVID-19 declaration under Section 564(b)(1) of the Act, 21 U.S.C. section 360bbb-3(b)(1), unless the authorization is terminated or revoked.  Performed at [facility], [place] [address] [place], [place] [address]      Labs: Basic Metabolic Panel: Recent Labs  Lab 07/08/20 1236 07/09/20 0146 07/10/20 0329  NA 135 137 136  K 4.0 3.8 3.8  CL 101 105 104  CO2 24 24 23   GLUCOSE 133* 148* 121*  BUN 24* 30* 22  CREATININE 1.59* 1.52* 1.25*  CALCIUM 9.2 8.4* 8.4*   Liver Function Tests: Recent Labs  Lab 07/08/20 1236  AST 21  ALT 19  ALKPHOS 53  BILITOT 1.5*  PROT 6.6  ALBUMIN 3.5   No results for input(s): LIPASE, AMYLASE in the last 168 hours. No results for input(s): AMMONIA in the last 168 hours. CBC: Recent Labs  Lab 07/08/20 1236 07/09/20 0146  WBC 11.2* 8.4  NEUTROABS 9.4*  --   HGB 11.4* 9.5*  HCT 36.2 29.6*  MCV 93.5 91.6  PLT 267 197   Cardiac Enzymes: Recent Labs  Lab 07/08/20 1236  CKTOTAL 101   BNP: BNP (last 3 results) Recent Labs    07/08/20 1757  BNP 79.6    ProBNP (last 3 results) No results for input(s): PROBNP in the last 8760 hours.  CBG: Recent Labs  Lab 07/09/20 1201 07/09/20 1650 07/09/20 2107 07/10/20 0753 07/10/20 1203  GLUCAP 193*  124* 153* 128* 159*       Signed:  Domenic Polite MD.  [facility] 07/10/2020, 3:29 PM

## 2020-07-10 NOTE — Plan of Care (Signed)

## 2020-07-10 NOTE — Progress Notes (Signed)
Pt discharged home with her daughter, Santiago Glad.  Oxygen equipment delivered to pt room prior to discharge and reviewed with pt and daughter.  All discharge instructions reviewed along with medications and follow-up information.  NAD.

## 2020-07-10 NOTE — TOC Progression Note (Addendum)
Transition of Care [facility]) - Progression Note    Patient Details  Name: Amanda [name] MRN: [id number] Date of Birth: 06/28/1941  [facility] of [facility]) CM/SW Contact  Carles Collet, RN Phone Number: 07/10/2020, 1:38 PM  Clinical Narrative:   Spoke w patient at bedside, she continues to decline [facility] services, she granted permission to speak w her daughters. Spoke w her daughter Amanda [name]. We reviewed PT OT recs. Amanda [name] states that she will plan to stay with her mom for 24-48 hours after DC to provide supervision. She states that she has gotten her mom a fall alert device. She declines any need for additional DME. She continues to decline HH services, identifying that her mom does not like other people in the house and that her and her siblings will be able to provide assistance. Reviewed w Amanda [name] that if they would like [place] after DC that the PCP can assist with setting that up.   Update- Home oxygen ordered through [employer] at 15:50    Expected Discharge Plan: Home/Self Care Barriers to Discharge: Continued Medical Work up  Expected Discharge Plan and Services Expected Discharge Plan: Home/Self Care   Discharge Planning Services: CM Consult   Living arrangements for the past 2 months: Single Family Home                           HH Arranged: NA, Patient Refused HH           Social Determinants of Health (SDOH) Interventions    Readmission Risk Interventions No flowsheet data found.

## 2020-07-10 NOTE — Progress Notes (Signed)
SATURATION QUALIFICATIONS: (This note is used to comply with regulatory documentation for home oxygen)  Patient Saturations on Room Air at Rest = 93%  Patient Saturations on Room Air while Ambulating = 83%  Patient Saturations on 2 Liters of oxygen while Ambulating = 94 %  Please briefly explain why patient needs home oxygen:

## 2020-07-19 ENCOUNTER — Telehealth: Payer: Self-pay | Admitting: Primary Care

## 2020-07-19 DIAGNOSIS — J449 Chronic obstructive pulmonary disease, unspecified: Secondary | ICD-10-CM

## 2020-07-19 NOTE — Telephone Encounter (Signed)
Spoke with the pt's daughter Santiago Glad  She states that pt had a fall last wk and had to go to the hospital  She was sent home on o2 and advised to use it as much as she could  She only used it for a couple of days, and then stopped  She does not feel that she needs this  She is checking her o2 with and without exertion on RA and sats are 93-96% ra Please advise if this is okay to send order to d/c  Thanks

## 2020-07-19 NOTE — Telephone Encounter (Signed)
Yes ok to discontinue, if she has an IS would recommend using it to help encourage deep breathing exercises. Sometimes after a fall if in pain we don't take deep breaths.

## 2020-07-22 NOTE — Telephone Encounter (Signed)
Called and spoke with pt's daughter Santiago Glad letting her know the info stated by [facility] and stated to her that we were going to place order to [employer] for pt's O2 to be d/c'd. Santiago Glad verbalized understanding. Order has been placed. Nothing further needed.

## 2020-08-06 ENCOUNTER — Other Ambulatory Visit: Payer: Self-pay | Admitting: Internal Medicine

## 2020-10-01 ENCOUNTER — Other Ambulatory Visit: Payer: Self-pay | Admitting: Internal Medicine

## 2020-10-02 ENCOUNTER — Other Ambulatory Visit: Payer: Self-pay | Admitting: Internal Medicine

## 2020-12-01 DEATH — deceased
